# Patient Record
Sex: Male | Born: 1955 | Race: Black or African American | Hispanic: No | Marital: Married | Smoking: Never smoker
Health system: Southern US, Community
[De-identification: ages and names within clinical notes are randomized; demographics above are authoritative.]

## PROBLEM LIST (undated history)

## (undated) DIAGNOSIS — Z9289 Personal history of other medical treatment: Secondary | ICD-10-CM

## (undated) DIAGNOSIS — I2510 Atherosclerotic heart disease of native coronary artery without angina pectoris: Secondary | ICD-10-CM

## (undated) DIAGNOSIS — E119 Type 2 diabetes mellitus without complications: Secondary | ICD-10-CM

## (undated) DIAGNOSIS — I10 Essential (primary) hypertension: Secondary | ICD-10-CM

## (undated) DIAGNOSIS — I252 Old myocardial infarction: Secondary | ICD-10-CM

## (undated) HISTORY — DX: Old myocardial infarction: I25.2

## (undated) HISTORY — DX: Personal history of other medical treatment: Z92.89

## (undated) HISTORY — DX: Atherosclerotic heart disease of native coronary artery without angina pectoris: I25.10

---

## 1999-02-05 ENCOUNTER — Ambulatory Visit (HOSPITAL_COMMUNITY): Admission: RE | Admit: 1999-02-05 | Discharge: 1999-02-05 | Payer: Self-pay | Admitting: Internal Medicine

## 1999-02-05 ENCOUNTER — Encounter: Payer: Self-pay | Admitting: Internal Medicine

## 1999-02-20 ENCOUNTER — Ambulatory Visit (HOSPITAL_COMMUNITY): Admission: RE | Admit: 1999-02-20 | Discharge: 1999-02-20 | Payer: Self-pay | Admitting: Internal Medicine

## 1999-02-20 ENCOUNTER — Encounter: Payer: Self-pay | Admitting: Internal Medicine

## 2000-09-01 ENCOUNTER — Other Ambulatory Visit: Admission: RE | Admit: 2000-09-01 | Discharge: 2000-09-01 | Payer: Self-pay | Admitting: Obstetrics and Gynecology

## 2010-12-27 ENCOUNTER — Emergency Department (HOSPITAL_COMMUNITY)
Admission: EM | Admit: 2010-12-27 | Discharge: 2010-12-28 | Disposition: A | Discharge status: Home / Self Care | Payer: BC Managed Care – PPO | Attending: Emergency Medicine | Admitting: Emergency Medicine

## 2010-12-27 DIAGNOSIS — I10 Essential (primary) hypertension: Secondary | ICD-10-CM | POA: Insufficient documentation

## 2010-12-27 DIAGNOSIS — Z86718 Personal history of other venous thrombosis and embolism: Secondary | ICD-10-CM | POA: Insufficient documentation

## 2010-12-28 LAB — DIFFERENTIAL
Basophils Absolute: 0 10*3/uL (ref 0.0–0.1)
Basophils Relative: 1 % (ref 0–1)
Eosinophils Absolute: 0.2 10*3/uL (ref 0.0–0.7)
Eosinophils Relative: 3 % (ref 0–5)
Lymphocytes Relative: 35 % (ref 12–46)
Lymphs Abs: 2.2 10*3/uL (ref 0.7–4.0)
Monocytes Absolute: 0.6 10*3/uL (ref 0.1–1.0)
Monocytes Relative: 9 % (ref 3–12)
Neutro Abs: 3.3 10*3/uL (ref 1.7–7.7)
Neutrophils Relative %: 52 % (ref 43–77)

## 2010-12-28 LAB — CBC
HCT: 39 % (ref 39.0–52.0)
Hemoglobin: 13.5 g/dL (ref 13.0–17.0)
MCH: 26 pg (ref 26.0–34.0)
MCHC: 34.6 g/dL (ref 30.0–36.0)
MCV: 75.1 fL — ABNORMAL LOW (ref 78.0–100.0)
Platelets: 245 10*3/uL (ref 150–400)
RBC: 5.19 MIL/uL (ref 4.22–5.81)
RDW: 13.9 % (ref 11.5–15.5)
WBC: 6.2 10*3/uL (ref 4.0–10.5)

## 2010-12-28 LAB — BASIC METABOLIC PANEL
BUN: 10 mg/dL (ref 6–23)
CO2: 27 mEq/L (ref 19–32)
Calcium: 9.1 mg/dL (ref 8.4–10.5)
Chloride: 107 mEq/L (ref 96–112)
Creatinine, Ser: 0.84 mg/dL (ref 0.4–1.5)
GFR calc Af Amer: >60 mL/min (ref >60)
GFR calc non Af Amer: >60 mL/min (ref >60)
Glucose, Bld: 211 mg/dL — ABNORMAL HIGH (ref 70–99)
Potassium: 4.1 mEq/L (ref 3.5–5.1)
Sodium: 140 mEq/L (ref 135–145)

## 2013-11-17 DIAGNOSIS — E119 Type 2 diabetes mellitus without complications: Secondary | ICD-10-CM

## 2013-11-17 HISTORY — DX: Type 2 diabetes mellitus without complications: E11.9

## 2013-12-18 DIAGNOSIS — I252 Old myocardial infarction: Secondary | ICD-10-CM

## 2013-12-18 HISTORY — DX: Old myocardial infarction: I25.2

## 2013-12-18 HISTORY — PX: CORONARY ANGIOPLASTY WITH STENT PLACEMENT: SHX49

## 2014-01-07 ENCOUNTER — Encounter (HOSPITAL_COMMUNITY): Payer: Self-pay | Admitting: Emergency Medicine

## 2014-01-07 ENCOUNTER — Inpatient Hospital Stay (HOSPITAL_COMMUNITY)
Admission: EM | Admit: 2014-01-07 | Discharge: 2014-01-10 | DRG: 247 | Disposition: A | Discharge status: Home / Self Care | Payer: BC Managed Care – PPO | Attending: Internal Medicine | Admitting: Internal Medicine

## 2014-01-07 ENCOUNTER — Emergency Department (HOSPITAL_COMMUNITY): Payer: BC Managed Care – PPO

## 2014-01-07 DIAGNOSIS — I214 Non-ST elevation (NSTEMI) myocardial infarction: Principal | ICD-10-CM

## 2014-01-07 DIAGNOSIS — I2129 ST elevation (STEMI) myocardial infarction involving other sites: Secondary | ICD-10-CM

## 2014-01-07 DIAGNOSIS — Z87891 Personal history of nicotine dependence: Secondary | ICD-10-CM

## 2014-01-07 DIAGNOSIS — E119 Type 2 diabetes mellitus without complications: Secondary | ICD-10-CM

## 2014-01-07 DIAGNOSIS — Z79899 Other long term (current) drug therapy: Secondary | ICD-10-CM

## 2014-01-07 DIAGNOSIS — I10 Essential (primary) hypertension: Secondary | ICD-10-CM

## 2014-01-07 HISTORY — DX: Essential (primary) hypertension: I10

## 2014-01-07 HISTORY — DX: Type 2 diabetes mellitus without complications: E11.9

## 2014-01-07 LAB — COMPREHENSIVE METABOLIC PANEL
ALBUMIN: 3.3 g/dL — AB (ref 3.5–5.2)
ALT: 24 U/L (ref 0–53)
AST: 43 U/L — ABNORMAL HIGH (ref 0–37)
Alkaline Phosphatase: 48 U/L (ref 39–117)
BUN: 18 mg/dL (ref 6–23)
CALCIUM: 9 mg/dL (ref 8.4–10.5)
CO2: 26 mEq/L (ref 19–32)
CREATININE: 0.93 mg/dL (ref 0.50–1.35)
Chloride: 96 mEq/L (ref 96–112)
GFR calc Af Amer: >90 mL/min (ref >90)
GFR calc non Af Amer: >90 mL/min (ref >90)
Glucose, Bld: 254 mg/dL — ABNORMAL HIGH (ref 70–99)
Potassium: 4.1 mEq/L (ref 3.7–5.3)
Sodium: 136 mEq/L — ABNORMAL LOW (ref 137–147)
Total Bilirubin: 0.6 mg/dL (ref 0.3–1.2)
Total Protein: 7.8 g/dL (ref 6.0–8.3)

## 2014-01-07 LAB — CBC
HEMATOCRIT: 40.7 % (ref 39.0–52.0)
Hemoglobin: 14.1 g/dL (ref 13.0–17.0)
MCH: 26.5 pg (ref 26.0–34.0)
MCHC: 34.6 g/dL (ref 30.0–36.0)
MCV: 76.5 fL — AB (ref 78.0–100.0)
Platelets: 246 10*3/uL (ref 150–400)
RBC: 5.32 MIL/uL (ref 4.22–5.81)
RDW: 13.5 % (ref 11.5–15.5)
WBC: 9 10*3/uL (ref 4.0–10.5)

## 2014-01-07 LAB — GLUCOSE, CAPILLARY
GLUCOSE-CAPILLARY: 173 mg/dL — AB (ref 70–99)
GLUCOSE-CAPILLARY: 149 mg/dL — AB (ref 70–99)
Glucose-Capillary: 212 mg/dL — ABNORMAL HIGH (ref 70–99)
Glucose-Capillary: 226 mg/dL — ABNORMAL HIGH (ref 70–99)

## 2014-01-07 LAB — TROPONIN I
TROPONIN I: 5.93 ng/mL — AB (ref <0.30)
TROPONIN I: 7.27 ng/mL — AB (ref <0.30)
Troponin I: 7.19 ng/mL (ref <0.30)
Troponin I: 5.05 ng/mL (ref <0.30)

## 2014-01-07 LAB — HEPARIN LEVEL (UNFRACTIONATED)
Heparin Unfractionated: 0.12 IU/mL — ABNORMAL LOW (ref 0.30–0.70)
Heparin Unfractionated: 0.15 IU/mL — ABNORMAL LOW (ref 0.30–0.70)

## 2014-01-07 LAB — PROTIME-INR
INR: 1.07 (ref 0.00–1.49)
PROTHROMBIN TIME: 13.7 s (ref 11.6–15.2)

## 2014-01-07 LAB — D-DIMER, QUANTITATIVE (NOT AT ARMC): D DIMER QUANT: 0.85 ug{FEU}/mL — AB (ref 0.00–0.48)

## 2014-01-07 LAB — APTT: aPTT: 25 seconds (ref 24–37)

## 2014-01-07 MED ORDER — HEPARIN BOLUS VIA INFUSION
2500.0000 [IU] | Freq: Once | INTRAVENOUS | Status: AC
  Administered 2014-01-07: 2500 [IU] via INTRAVENOUS
  Filled 2014-01-07: qty 2500

## 2014-01-07 MED ORDER — INSULIN ASPART 100 UNIT/ML ~~LOC~~ SOLN: 0.0000 [IU] | Freq: Every day | SUBCUTANEOUS | Status: DC

## 2014-01-07 MED ORDER — INSULIN ASPART 100 UNIT/ML ~~LOC~~ SOLN
0.0000 [IU] | Freq: Three times a day (TID) | SUBCUTANEOUS | Status: DC
  Administered 2014-01-08 (×3): 3 [IU] via SUBCUTANEOUS
  Administered 2014-01-09: 2 [IU] via SUBCUTANEOUS
  Administered 2014-01-09 (×2): 3 [IU] via SUBCUTANEOUS
  Administered 2014-01-10: 09:00:00 5 [IU] via SUBCUTANEOUS

## 2014-01-07 MED ORDER — METOPROLOL TARTRATE 25 MG PO TABS
25.0000 mg | ORAL_TABLET | Freq: Two times a day (BID) | ORAL | Status: DC
  Administered 2014-01-07 – 2014-01-10 (×6): 25 mg via ORAL
  Filled 2014-01-07 (×8): qty 1

## 2014-01-07 MED ORDER — HEPARIN BOLUS VIA INFUSION
4000.0000 [IU] | Freq: Once | INTRAVENOUS | Status: AC
  Administered 2014-01-07: 4000 [IU] via INTRAVENOUS
  Filled 2014-01-07: qty 4000

## 2014-01-07 MED ORDER — HEPARIN (PORCINE) IN NACL 100-0.45 UNIT/ML-% IJ SOLN
1200.0000 [IU]/h | INTRAMUSCULAR | Status: DC
  Administered 2014-01-07: 1200 [IU]/h via INTRAVENOUS
  Filled 2014-01-07: qty 250

## 2014-01-07 MED ORDER — ASPIRIN EC 81 MG PO TBEC
81.0000 mg | DELAYED_RELEASE_TABLET | Freq: Every day | ORAL | Status: DC
  Administered 2014-01-08: 81 mg via ORAL
  Filled 2014-01-07 (×2): qty 1

## 2014-01-07 MED ORDER — NITROGLYCERIN 0.4 MG SL SUBL: 0.4000 mg | SUBLINGUAL_TABLET | SUBLINGUAL | Status: DC | PRN

## 2014-01-07 MED ORDER — INSULIN ASPART 100 UNIT/ML ~~LOC~~ SOLN
0.0000 [IU] | Freq: Three times a day (TID) | SUBCUTANEOUS | Status: DC
  Administered 2014-01-07: 3 [IU] via SUBCUTANEOUS
  Administered 2014-01-07: 2 [IU] via SUBCUTANEOUS
  Administered 2014-01-07: 3 [IU] via SUBCUTANEOUS

## 2014-01-07 MED ORDER — ASPIRIN 81 MG PO CHEW
324.0000 mg | CHEWABLE_TABLET | Freq: Once | ORAL | Status: AC
  Administered 2014-01-07: 324 mg via ORAL
  Filled 2014-01-07: qty 4

## 2014-01-07 MED ORDER — HYDROCHLOROTHIAZIDE 12.5 MG PO CAPS
12.5000 mg | ORAL_CAPSULE | Freq: Every day | ORAL | Status: DC
Start: 2014-01-07 — End: 2014-01-09
  Administered 2014-01-07 – 2014-01-08 (×2): 12.5 mg via ORAL
  Filled 2014-01-07 (×3): qty 1

## 2014-01-07 MED ORDER — HEPARIN (PORCINE) IN NACL 100-0.45 UNIT/ML-% IJ SOLN
1750.0000 [IU]/h | INTRAMUSCULAR | Status: DC
  Administered 2014-01-08 – 2014-01-09 (×2): 1750 [IU]/h via INTRAVENOUS
  Filled 2014-01-07 (×4): qty 250

## 2014-01-07 MED ORDER — HEPARIN (PORCINE) IN NACL 100-0.45 UNIT/ML-% IJ SOLN
1500.0000 [IU]/h | INTRAMUSCULAR | Status: DC
  Administered 2014-01-07: 1500 [IU]/h via INTRAVENOUS
  Filled 2014-01-07 (×2): qty 250

## 2014-01-07 MED ORDER — ATORVASTATIN CALCIUM 40 MG PO TABS
40.0000 mg | ORAL_TABLET | Freq: Every day | ORAL | Status: DC
  Administered 2014-01-07 – 2014-01-09 (×3): 40 mg via ORAL
  Filled 2014-01-07 (×4): qty 1

## 2014-01-07 MED ORDER — NITROGLYCERIN 2 % TD OINT
1.0000 [in_us] | TOPICAL_OINTMENT | Freq: Three times a day (TID) | TRANSDERMAL | Status: DC
  Administered 2014-01-07 – 2014-01-09 (×7): 1 [in_us] via TOPICAL
  Filled 2014-01-07: qty 30

## 2014-01-07 MED ORDER — NITROGLYCERIN 2 % TD OINT
0.5000 [in_us] | TOPICAL_OINTMENT | Freq: Once | TRANSDERMAL | Status: AC
  Administered 2014-01-07: 0.5 [in_us] via TOPICAL
  Filled 2014-01-07: qty 1

## 2014-01-07 NOTE — Progress Notes (Signed)
Patient Name: William [name] Date of Encounter: 01/07/2014  Active Problems:   NSTEMI (non-ST elevated myocardial infarction)   HTN (hypertension)    SUBJECTIVE: No chest pain, no SOB   OBJECTIVE Filed Vitals:   01/07/14 0447 01/07/14 0530 01/07/14 0632 01/07/14 0722  BP: 151/70 153/74 158/74 153/78  Pulse: 79 82 77 78  Temp:  98.4 F (36.9 C)  98.3 F (36.8 C)  TempSrc:  Oral  Oral  Resp: 14 23 14    Height:    5\' 10"  (1.778 m)  Weight:    214 lb 1.6 oz (97.115 kg)  SpO2: 99% 98% 100% 98%    Intake/Output Summary (Last 24 hours) at 01/07/14 0855 Last data filed at 01/07/14 0646  Gross per 24 hour  Intake      0 ml  Output    650 ml  Net   -650 ml   Filed Weights   01/07/14 0318 01/07/14 0722  Weight: 222 lb (100.699 kg) 214 lb 1.6 oz (97.115 kg)   LABS: CBC: Recent Labs  01/07/14 0342  WBC 9.0  HGB 14.1  HCT 40.7  MCV 76.5*  PLT 246   INR: Recent Labs  01/07/14 0342  INR 7.35   Basic Metabolic Panel: Recent Labs  01/07/14 0342  NA 136*  K 4.1  CL 96  CO2 26  GLUCOSE 254*  BUN 18  CREATININE 0.93  CALCIUM 9.0   Liver Function Tests: Recent Labs  01/07/14 0342  AST 43*  ALT 24  ALKPHOS 48  BILITOT 0.6  PROT 7.8  ALBUMIN 3.3*   Cardiac Enzymes: Recent Labs  01/07/14 0413  TROPONINI 5.93*   D-dimer: Recent Labs  01/07/14 0413  DDIMER 0.85*   TELE:   SR  Radiology/Studies: Dg Chest Portable 1 View  01/07/2014   CLINICAL DATA:  Chest discomfort and shortness of breath.  EXAM: PORTABLE CHEST - 1 VIEW  COMPARISON:  None.  FINDINGS: The lungs are well-aerated and clear. There is no evidence of focal opacification, pleural effusion or pneumothorax.  The cardiomediastinal silhouette is within normal limits. No acute osseous abnormalities are seen.  IMPRESSION: No acute cardiopulmonary process seen.   Electronically Signed   By: Garald Balding M.D.   On: 01/07/2014 04:26     Current Medications:  . [START ON 01/08/2014]  aspirin EC  81 mg Oral Daily  . atorvastatin  40 mg Oral q1800  . hydrochlorothiazide  12.5 mg Oral Daily  . insulin aspart  0-5 Units Subcutaneous QHS  . insulin aspart  0-9 Units Subcutaneous TID WC  . metoprolol tartrate  25 mg Oral BID   . heparin 1,200 Units/hr (01/07/14 0521)   Physical exam: General: Alert and oriented x3 in no acute distress, pleasant sitting in chair Cardiovascular: Regular rate and rhythm, 2/6 systolic murmur left lower sternal border, no JVD Lungs: Clear to auscultation bilaterally no wheezes, normal respiratory effort Abdomen: Soft, nontender, normal bowel sounds Skin: Warm and dry Pulses: Excellent. Excellent radial pulse. Neurologic: Nonfocal.  ASSESSMENT AND PLAN: Active Problems:   NSTEMI (non-ST elevated myocardial infarction) - Continue ASA/statin/BB, will add nitro paste to meds, OK to eat now since pain-free, but will schedule cath Mon. The risks and benefits of a cardiac catheterization including, but not limited to, death, stroke, MI, kidney damage and bleeding were discussed with the patient and his wife who indicate understanding and agree to proceed.     HTN (hypertension) - BB added, follow  Hyperglycemia - A1c ordered, on SSI.  Signed, Rosaria Ferries , PA-C 8:55 AM 01/07/2014  Patient personally seen and examined, agree with above.  58 year old male with no prior cardiovascular history with non-ST elevation myocardial infarction.  1. Non-ST elevation myocardial infarction-troponin is now in the 7 range. He is currently chest pain-free. No shortness of breath. I discussed with him cardiac catheterization. If symptoms worsen over the weekend, we will proceed urgently with heart cath. If not, Monday. Excellent radial pulse. Continue with IV heparin, metoprolol, nitroglycerin patch, aspirin, atorvastatin. I will not administer Plavix in case surgical disease is present. He understands that he is at increased risk over the next 30 days.  2.  Heart murmur-I will check echocardiogram.  3. Abnormal EKG-ST segment depression noted in the inferior/lateral leads. No segment elevation. This is consistent with ischemia. Heart catheterization pending.  4. Hypertension-beta blocker. Low-dose hydrochlorothiazide. Add ACE inhibitor if necessary.  5. Elevated glucose-254 on arrival. Could certainly have diabetes. Checking hemoglobin A1c. May benefit from metformin in the future.   Candee Furbish, MD

## 2014-01-07 NOTE — ED Notes (Signed)
Report given to [facility] unit nurse , transported pt. In stable condition , denies chest pain / respirations unlabored , iv site unremarkable / Heparin drip infusing .

## 2014-01-07 NOTE — Progress Notes (Addendum)
Pts D-dimer 0.85; pt on IV heparin, PA notified; pt without any complaints, was told would reassess in AM

## 2014-01-07 NOTE — H&P (Signed)
History and Physical  Patient ID: William [name] MRN: [id number], DOB: 02-17-1956 Date of Encounter: 01/07/2014, 6:17 AM Primary Physician: Elyn Peers, MD Primary Cardiologist: nill  Chief Complaint: chest pain  Reason for Admission: NSTE- ACS   HPI: 58 yr old male with hx of HTN , prior smoker presents with chest pain   Pt states that for the past 2-3 days he has been experiencing substernal dull discomfort which is non radiating and somewhat with SL ntg given to him in the ER. This is the first time he has experienced with discomfort . Pt denies any SOB , orthopnea, PND , LE edema , Syncope ,claudcation , focal weakness, or bleeding diathesis .    Past Medical History  Diagnosis Date  . Hypertension      Most Recent Cardiac Studies: nill    Surgical History: History reviewed. No pertinent past surgical history.   Home Meds: Prior to Admission medications   Medication Sig Start Date End Date Taking? Authorizing Provider  ibuprofen (ADVIL,MOTRIN) 200 MG tablet Take 400-600 mg by mouth every 8 (eight) hours as needed for mild pain.   Yes Historical Provider, MD  PRESCRIPTION MEDICATION Take 1 tablet by mouth daily. Amlodipine/valsartan/hctz-unknown strength   Yes Historical Provider, MD    Allergies:  Allergies  Allergen Reactions  . Shellfish Allergy Hives    History   Social History  . Marital Status: Married    Spouse Name: N/A    Number of Children: N/A  . Years of Education: N/A   Occupational History  . Not on file.   Social History Main Topics  . Smoking status: Never Smoker   . Smokeless tobacco: Not on file  . Alcohol Use: Yes  . Drug Use: Not on file  . Sexual Activity: Not on file   Other Topics Concern  . Not on file   Social History Narrative  . No narrative on file     No family history on file.  Review of Systems: General: negative for chills, fever, night sweats or weight changes.  Cardiovascular: per HPI  Dermatological: negative  for rash Respiratory: negative for cough or wheezing Urologic: negative for hematuria Abdominal: negative for nausea, vomiting, diarrhea, bright red blood per rectum, melena, or hematemesis Neurologic: negative for visual changes, syncope, or dizziness All other systems reviewed and are otherwise negative except as noted above.  Labs:   Lab Results  Component Value Date   WBC 9.0 01/07/2014   HGB 14.1 01/07/2014   HCT 40.7 01/07/2014   MCV 76.5* 01/07/2014   PLT 246 01/07/2014    Recent Labs Lab 01/07/14 0342  NA 136*  K 4.1  CL 96  CO2 26  BUN 18  CREATININE 0.93  CALCIUM 9.0  PROT 7.8  BILITOT 0.6  ALKPHOS 48  ALT 24  AST 43*  GLUCOSE 254*    Recent Labs  01/07/14 0413  TROPONINI 5.93*   No results found for this basename: CHOL, HDL, LDLCALC, TRIG   Lab Results  Component Value Date   DDIMER 0.85* 01/07/2014    Radiology/Studies:  Dg Chest Portable 1 View  01/07/2014   CLINICAL DATA:  Chest discomfort and shortness of breath.  EXAM: PORTABLE CHEST - 1 VIEW  COMPARISON:  None.  FINDINGS: The lungs are well-aerated and clear. There is no evidence of focal opacification, pleural effusion or pneumothorax.  The cardiomediastinal silhouette is within normal limits. No acute osseous abnormalities are seen.  IMPRESSION: No acute cardiopulmonary process seen.  Electronically Signed   By: Garald Balding M.D.   On: 01/07/2014 04:26     EKG: NSR with inferolateral ST depression with TWI   Physical Exam: Blood pressure 153/74, pulse 82, temperature 98.4 F (36.9 C), temperature source Oral, resp. rate 23, height 5\' 8"  (1.727 m), weight 100.699 kg (222 lb), SpO2 98.00%. General: Well developed, well nourished, in no acute distress. Head: Normocephalic, atraumatic, sclera non-icteric, no xanthomas, nares are without discharge.  Neck: Negative for carotid bruits. JVD not elevated. Lungs: Clear bilaterally to auscultation without wheezes, rales, or rhonchi. Breathing is  unlabored. Heart: RRR with S1 S2. S9/6 systolic murmur  Abdomen: Soft, non-tender, non-distended with normoactive bowel sounds. No hepatomegaly. No rebound/guarding. No obvious abdominal masses. Msk:  Strength and tone appear normal for age. Extremities: No clubbing or cyanosis. No edema.  Distal pedal pulses are 2+ and equal bilaterally. Neuro: Alert and oriented X 3. No focal deficit. No facial asymmetry. Moves all extremities spontaneously. Psych:  Responds to questions appropriately with a normal affect.    ASSESSMENT AND PLAN:  NSTE- ACS  HTN uncontrolled  Hyperglycemia   Plan  Start heparin , aspirin , statin and b-blocker  Cont HCTZ  Start SSI ACHS , check Hg A1c , lipid profile  NPO for possible LHC in am   Signed, Lavaeh Bau, A PA-C 01/07/2014, 6:17 AM

## 2014-01-07 NOTE — ED Notes (Signed)
The pt is c/o mid-chest pain for 3-4 days.  No previous history.  No sob  Nausea ot dizziness.  Better with stretching

## 2014-01-07 NOTE — ED Provider Notes (Signed)
CSN: [id number]     Arrival date & time 01/07/14  0310 History   First MD Initiated Contact with Patient 01/07/14 [phone]     Chief Complaint  Patient presents with  . Chest Pain     (Consider location/radiation/quality/duration/timing/severity/associated sxs/prior Treatment) HPI Comments: 58 year old male, history of hypertension who presents with a complaint of left-sided chest.  He states that he has never had any problems with his heart that he knows of, he has never had any exertional pain or shortness of breath but over the last 3-4 days he has had a vague discomfort in the left side of his chest which is intermittent, does not appear to be worse with deep breathing position or exertion. He is only having very very mild symptoms at this time, he does not call it pain at this time. He has had no swelling in his legs, no recent travel trauma or immobilization and he does not smoke cigarettes. He does recall a distant history of a pulmonary embolism when he was much younger, he does not take anticoagulation anymore. He did have some aspirin over the last couple of days and was given aspirin on arrival. He denies fevers chills nausea vomiting or coughing or shortness of breath.  Patient is a 58 y.o. male presenting with chest pain. The history is provided by the patient and the spouse.  Chest Pain   Past Medical History  Diagnosis Date  . Hypertension    History reviewed. No pertinent past surgical history. No family history on file. History  Substance Use Topics  . Smoking status: Never Smoker   . Smokeless tobacco: Not on file  . Alcohol Use: Yes    Review of Systems  Cardiovascular: Positive for chest pain.  All other systems reviewed and are negative.      Allergies  Shellfish allergy  Home Medications   Current Outpatient Rx  Name  Route  Sig  Dispense  Refill  . ibuprofen (ADVIL,MOTRIN) 200 MG tablet   Oral   Take 400-600 mg by mouth every 8 (eight) hours as needed  for mild pain.         Marland Kitchen PRESCRIPTION MEDICATION   Oral   Take 1 tablet by mouth daily. Amlodipine/valsartan/hctz-unknown strength          BP 151/70  Pulse 79  Temp(Src) 98.8 F (37.1 C)  Resp 14  Ht 5\' 8"  (1.727 m)  Wt 222 lb (100.699 kg)  BMI 33.76 kg/m2  SpO2 99% Physical Exam  Nursing note and vitals reviewed. Constitutional: He appears well-developed and well-nourished. No distress.  HENT:  Head: Normocephalic and atraumatic.  Mouth/Throat: Oropharynx is clear and moist. No oropharyngeal exudate.  Eyes: Conjunctivae and EOM are normal. Pupils are equal, round, and reactive to light. Right eye exhibits no discharge. Left eye exhibits no discharge. No scleral icterus.  Neck: Normal range of motion. Neck supple. No JVD present. No thyromegaly present.  Cardiovascular: Normal rate, regular rhythm, normal heart sounds and intact distal pulses.  Exam reveals no gallop and no friction rub.   No murmur heard. Pulmonary/Chest: Effort normal and breath sounds normal. No respiratory distress. He has no wheezes. He has no rales.  Abdominal: Soft. Bowel sounds are normal. He exhibits no distension and no mass. There is no tenderness.  Musculoskeletal: Normal range of motion. He exhibits no edema and no tenderness.  Lymphadenopathy:    He has no cervical adenopathy.  Neurological: He is alert. Coordination normal.  Skin: Skin is warm  and dry. No rash noted. No erythema.  Psychiatric: He has a normal mood and affect. His behavior is normal.    ED Course  Procedures (including critical care time) Labs Review Labs Reviewed  CBC - Abnormal; Notable for the following:    MCV 76.5 (*)    All other components within normal limits  COMPREHENSIVE METABOLIC PANEL - Abnormal; Notable for the following:    Sodium 136 (*)    Glucose, Bld 254 (*)    Albumin 3.3 (*)    AST 43 (*)    All other components within normal limits  D-DIMER, QUANTITATIVE - Abnormal; Notable for the following:     D-Dimer, Quant 0.85 (*)    All other components within normal limits  TROPONIN I - Abnormal; Notable for the following:    Troponin I 5.93 (*)    All other components within normal limits  APTT  PROTIME-INR   Imaging Review Dg Chest Portable 1 View  01/07/2014   CLINICAL DATA:  Chest discomfort and shortness of breath.  EXAM: PORTABLE CHEST - 1 VIEW  COMPARISON:  None.  FINDINGS: The lungs are well-aerated and clear. There is no evidence of focal opacification, pleural effusion or pneumothorax.  The cardiomediastinal silhouette is within normal limits. No acute osseous abnormalities are seen.  IMPRESSION: No acute cardiopulmonary process seen.   Electronically Signed   By: Garald Balding M.D.   On: 01/07/2014 04:26    EKG Interpretation    Date/Time:  Saturday January 07 2014 03:17:15 EST Ventricular Rate:  87 PR Interval:  190 QRS Duration: 96 QT Interval:  356 QTC Calculation: 428 R Axis:   50 Text Interpretation:  Normal sinus rhythm ST \\T \ T wave abnormality, consider lateral ischemia Abnormal ECG Since last tracing T wave inversion now found Confirmed by Deshaun Weisinger  MD, Marthe Dant ([id number]) on 01/07/2014 4:26:08 AM            MDM   Final diagnoses:  NSTEMI (non-ST elevated myocardial infarction)    The patient's exam is benign, his EKG does show abnormal findings in the lateral leads with T wave inversions that could be consistent with ischemia. His pain is intermittent and fluctuating, he will also need evaluation for a pulmonary embolism as he does have a history of this and is otherwise fairly low risk for heart disease.  The patient's troponin is elevated, close to 6, renal function is preserved, hyperglycemic at 250 with otherwise normal electrolytes. Chest x-ray shows no acute findings.  I am concerned with the patient's new T-wave inversions on EKG and elevated troponin number cardiology has been paged, heparin started  CRITICAL CARE Performed by: Johnna Acosta Total  critical care time: 35 Critical care time was exclusive of separately billable procedures and treating other patients. Critical care was necessary to treat or prevent imminent or life-threatening deterioration. Critical care was time spent personally by me on the following activities: development of treatment plan with patient and/or surrogate as well as nursing, discussions with consultants, evaluation of patient's response to treatment, examination of patient, obtaining history from patient or surrogate, ordering and performing treatments and interventions, ordering and review of laboratory studies, ordering and review of radiographic studies, pulse oximetry and re-evaluation of patient's condition.   Johnna Acosta, MD 01/07/14 [phone]

## 2014-01-07 NOTE — Progress Notes (Signed)
ANTICOAGULATION CONSULT NOTE - Initial Consult  Pharmacy Consult for Heparin  Indication: chest pain/ACS  Allergies  Allergen Reactions  . Shellfish Allergy Hives    Patient Measurements: Height: 5\' 10"  (177.8 cm) Weight: 215 lb 11.2 oz (97.841 kg) IBW/kg (Calculated) : 73 Heparin Dosing Weight: ~90 kg  Vital Signs: Temp: 98.2 F (36.8 C) (02/21 2043) Temp src: Oral (02/21 2043) BP: 142/72 mmHg (02/21 2043) Pulse Rate: 67 (02/21 2043)  Labs:  Recent Labs  01/07/14 0342  01/07/14 0855 01/07/14 1257 01/07/14 1951 01/07/14 2117  HGB 14.1  --   --   --   --   --   HCT 40.7  --   --   --   --   --   PLT 246  --   --   --   --   --   APTT 25  --   --   --   --   --   LABPROT 13.7  --   --   --   --   --   INR 1.07  --   --   --   --   --   HEPARINUNFRC  --   --   --  0.12*  --  0.15*  CREATININE 0.93  --   --   --   --   --   TROPONINI  --   < > 7.27* 7.19* 5.05*  --   < > = values in this interval not displayed.  Estimated Creatinine Clearance: 104 ml/min (by C-G formula based on Cr of 0.93).   Assessment: 59 y/o M with PMH of HTN presents with CP. Heparin level is still subtherapeutic. Will try to give a bolus and increase the rate again.  Goal of Therapy:  Heparin level 0.3-0.7 units/ml Monitor platelets by anticoagulation protocol: Yes   Plan:   Heparin bolus 2500 units x1 Increase drip to 1750 units/hr F/u heparin drip in AM

## 2014-01-07 NOTE — Progress Notes (Signed)
ANTICOAGULATION CONSULT NOTE - Initial Consult  Pharmacy Consult for Heparin  Indication: chest pain/ACS  Allergies  Allergen Reactions  . Shellfish Allergy Hives    Patient Measurements: Height: 5\' 8"  (172.7 cm) Weight: 222 lb (100.699 kg) IBW/kg (Calculated) : 68.4 Heparin Dosing Weight: ~90 kg  Vital Signs: Temp: 98.4 F (36.9 C) (02/21 0530) Temp src: Oral (02/21 0530) BP: 153/74 mmHg (02/21 0530) Pulse Rate: 82 (02/21 0530)  Labs:  Recent Labs  01/07/14 0342 01/07/14 0413  HGB 14.1  --   HCT 40.7  --   PLT 246  --   APTT 25  --   LABPROT 13.7  --   INR 1.07  --   CREATININE 0.93  --   TROPONINI  --  5.93*    Estimated Creatinine Clearance: 102 ml/min (by C-G formula based on Cr of 0.93).   Assessment: 58 y/o M with PMH of HTN presents with CP, started on heparin by EDP, to be continued by cardiology. Labs as above.   Goal of Therapy:  Heparin level 0.3-0.7 units/ml Monitor platelets by anticoagulation protocol: Yes   Plan:  -Continue heparin 1200 units/hr (already got 4000 unit BOLUS) -1200 HL -Daily CBC/HL -F/U cardiology plans  Narda Bonds 01/07/2014,6:27 AM

## 2014-01-07 NOTE — Progress Notes (Signed)
Pharmacy Note-Anticoagulation  Pharmacy Consult :  58 y.o. male is currently on Heparin infusion for NSTEMI.   Latest Labs : Hematology :  Recent Labs  01/07/14 0342 01/07/14 1257  HGB 14.1  --   HCT 40.7  --   PLT 246  --   APTT 25  --   LABPROT 13.7  --   INR 1.07  --   HEPARINUNFRC  --  0.12*  CREATININE 0.93  --     Lab Results  Component Value Date   INR 1.07 01/07/2014        HEPARINUNFRC 0.12* 01/07/2014        HGB 14.1 01/07/2014   HGB 13.5 12/28/2010    Current Infusion[s]:  . heparin 1,200 Units/hr (01/07/14 0521)    Assessment :  Heparin level is Sub-therapeutic, 0.12.  Heparin infusing at 1200 units/hr..  No bleeding complications observed.  Goal :  Heparin goal is Heparin level 0.3-0.7 units/ml.  Plan :   Increase Heparin infusion to 1500 units/hr   Next Heparin level will be due in 6 hours, 2100 pm.   Daily heparin level and CBC.  Monitor for bleeding complications.  Jarvis Sawa, Craig Guess, Pharm.D. 01/07/2014  2:28 PM

## 2014-01-08 DIAGNOSIS — I517 Cardiomegaly: Secondary | ICD-10-CM

## 2014-01-08 DIAGNOSIS — I10 Essential (primary) hypertension: Secondary | ICD-10-CM

## 2014-01-08 LAB — CBC
HEMATOCRIT: 38.5 % — AB (ref 39.0–52.0)
HEMOGLOBIN: 13.3 g/dL (ref 13.0–17.0)
MCH: 26.3 pg (ref 26.0–34.0)
MCHC: 34.5 g/dL (ref 30.0–36.0)
MCV: 76.1 fL — ABNORMAL LOW (ref 78.0–100.0)
Platelets: 222 10*3/uL (ref 150–400)
RBC: 5.06 MIL/uL (ref 4.22–5.81)
RDW: 13.3 % (ref 11.5–15.5)
WBC: 6.9 10*3/uL (ref 4.0–10.5)

## 2014-01-08 LAB — BASIC METABOLIC PANEL
BUN: 17 mg/dL (ref 6–23)
CHLORIDE: 102 meq/L (ref 96–112)
CO2: 25 mEq/L (ref 19–32)
Calcium: 8.6 mg/dL (ref 8.4–10.5)
Creatinine, Ser: 0.82 mg/dL (ref 0.50–1.35)
GFR calc Af Amer: >90 mL/min (ref >90)
Glucose, Bld: 186 mg/dL — ABNORMAL HIGH (ref 70–99)
Potassium: 4 mEq/L (ref 3.7–5.3)
Sodium: 137 mEq/L (ref 137–147)

## 2014-01-08 LAB — LIPID PANEL
CHOLESTEROL: 105 mg/dL (ref 0–200)
HDL: 41 mg/dL (ref >39)
LDL Cholesterol: 49 mg/dL (ref 0–99)
Total CHOL/HDL Ratio: 2.6 RATIO
Triglycerides: 73 mg/dL (ref <150)
VLDL: 15 mg/dL (ref 0–40)

## 2014-01-08 LAB — HEMOGLOBIN A1C
Hgb A1c MFr Bld: 9 % — ABNORMAL HIGH (ref <5.7)
Mean Plasma Glucose: 212 mg/dL — ABNORMAL HIGH (ref <117)

## 2014-01-08 LAB — PROTIME-INR
INR: 1.04 (ref 0.00–1.49)
Prothrombin Time: 13.4 seconds (ref 11.6–15.2)

## 2014-01-08 LAB — GLUCOSE, CAPILLARY
GLUCOSE-CAPILLARY: 179 mg/dL — AB (ref 70–99)
GLUCOSE-CAPILLARY: 195 mg/dL — AB (ref 70–99)
Glucose-Capillary: 192 mg/dL — ABNORMAL HIGH (ref 70–99)
Glucose-Capillary: 170 mg/dL — ABNORMAL HIGH (ref 70–99)

## 2014-01-08 LAB — HEPARIN LEVEL (UNFRACTIONATED)
HEPARIN UNFRACTIONATED: 0.4 [IU]/mL (ref 0.30–0.70)
Heparin Unfractionated: 0.45 IU/mL (ref 0.30–0.70)

## 2014-01-08 MED ORDER — SODIUM CHLORIDE 0.9 % IJ SOLN: 3.0000 mL | INTRAMUSCULAR | Status: DC | PRN

## 2014-01-08 MED ORDER — SODIUM CHLORIDE 0.9 % IJ SOLN: 3.0000 mL | Freq: Two times a day (BID) | INTRAMUSCULAR | Status: DC

## 2014-01-08 MED ORDER — SODIUM CHLORIDE 0.9 % IV SOLN
1.0000 mL/kg/h | INTRAVENOUS | Status: DC
  Administered 2014-01-09: 1 mL/kg/h via INTRAVENOUS

## 2014-01-08 MED ORDER — ASPIRIN 81 MG PO CHEW
324.0000 mg | CHEWABLE_TABLET | ORAL | Status: AC
  Administered 2014-01-09: 324 mg via ORAL
  Filled 2014-01-08: qty 4

## 2014-01-08 MED ORDER — DIAZEPAM 5 MG PO TABS
5.0000 mg | ORAL_TABLET | ORAL | Status: AC
  Administered 2014-01-09: 5 mg via ORAL
  Filled 2014-01-08: qty 1

## 2014-01-08 MED ORDER — ASPIRIN 81 MG PO CHEW
81.0000 mg | CHEWABLE_TABLET | ORAL | Status: DC
Start: 2014-01-09 — End: 2014-01-08

## 2014-01-08 MED ORDER — DIAZEPAM 5 MG PO TABS: 5.0000 mg | ORAL_TABLET | ORAL | Status: DC

## 2014-01-08 MED ORDER — SODIUM CHLORIDE 0.9 % IV SOLN: 250.0000 mL | INTRAVENOUS | Status: DC | PRN

## 2014-01-08 MED ORDER — SODIUM CHLORIDE 0.9 % IV SOLN: 1.0000 mL/kg/h | INTRAVENOUS | Status: DC

## 2014-01-08 NOTE — Progress Notes (Signed)
Pharmacy Note-Anticoagulation  Pharmacy Consult :  58 y.o. male is currently on Heparin for NSTEMI.  Scheduled Cardiac Cath in AM..   Heparin Dosing Wt :  90 kg Latest Labs : Hematology :  Recent Labs  01/07/14 0342 01/07/14 1257 01/07/14 2117 01/08/14 0607 01/08/14 1250  HGB 14.1  --   --  13.3  --   HCT 40.7  --   --  38.5*  --   PLT 246  --   --  222  --   APTT 25  --   --   --   --   LABPROT 13.7  --   --  13.4  --   INR 1.07  --   --  1.04  --   HEPARINUNFRC  --  0.12* 0.15* 0.40 0.45  CREATININE 0.93  --   --  0.82  --     Lab Results  Component Value Date   INR 1.04 01/08/2014   INR 1.07 01/07/2014        HEPARINUNFRC 0.45 01/08/2014   HEPARINUNFRC 0.40 01/08/2014   HEPARINUNFRC 0.15* 01/07/2014        HGB 13.3 01/08/2014   HGB 14.1 01/07/2014   HGB 13.5 12/28/2010   Current Infusion[s]: Infusions:  . heparin 1,750 Units/hr (01/08/14 1139)    Assessment :  Heparin infusing at 1750 units/hr.  Heparin level is within the therapeutic range, 0.45 units/ml.  No bleeding complications observed.  Goal :  Heparin goal is Heparin level 0.3-0.7 units/ml.  Plan : 1. Heparin will be continued at 1750 units/hr.  2. Daily Heparin level, CBC while on Heparin.  Monitor for bleeding complications.    Leasia Swann, Craig Guess, Pharm.D. 01/08/2014  2:45 PM

## 2014-01-08 NOTE — Progress Notes (Signed)
[facility] for Heparin  Indication: chest pain/ACS  Allergies  Allergen Reactions  . Shellfish Allergy Hives    Patient Measurements: Height: 5\' 10"  (177.8 cm) Weight: 215 lb 11.2 oz (97.841 kg) IBW/kg (Calculated) : 73 Heparin Dosing Weight: ~90 kg  Vital Signs: Temp: 99.2 F (37.3 C) (02/22 0529) Temp src: Oral (02/22 0529) BP: 134/65 mmHg (02/22 0529) Pulse Rate: 65 (02/22 0529)  Labs:  Recent Labs  01/07/14 0342  01/07/14 0855 01/07/14 1257 01/07/14 1951 01/07/14 2117 01/08/14 0607  HGB 14.1  --   --   --   --   --  13.3  HCT 40.7  --   --   --   --   --  38.5*  PLT 246  --   --   --   --   --  222  APTT 25  --   --   --   --   --   --   LABPROT 13.7  --   --   --   --   --  13.4  INR 1.07  --   --   --   --   --  1.04  HEPARINUNFRC  --   --   --  0.12*  --  0.15* 0.40  CREATININE 0.93  --   --   --   --   --   --   TROPONINI  --   < > 7.27* 7.19* 5.05*  --   --   < > = values in this interval not displayed.  Estimated Creatinine Clearance: 104 ml/min (by C-G formula based on Cr of 0.93).   Assessment: 58 y/o M with PMH of HTN on heparin for CP. HL 0.40. Other labs as above.   Goal of Therapy:  Heparin level 0.3-0.7 units/ml Monitor platelets by anticoagulation protocol: Yes   Plan:  -Continue heparin 1750 units/hr  -1200 HL -Daily CBC/HL -F/U cardiology plans  Narda Bonds 01/08/2014,6:54 AM

## 2014-01-08 NOTE — Progress Notes (Signed)
Utilization review completed.  

## 2014-01-08 NOTE — Progress Notes (Signed)
Subjective:  Feels better, no chest pain, no chest tightness, no shortness of breath. Awaiting cardiac catheterization tomorrow.  Objective:  Vital Signs in the last 24 hours: Temp:  [98.2 F (36.8 C)-99.2 F (37.3 C)] 99.2 F (37.3 C) (02/22 0529) Pulse Rate:  [61-78] 61 (02/22 0916) Resp:  [15-20] 18 (02/22 0529) BP: (127-142)/(59-72) 127/63 mmHg (02/22 0916) SpO2:  [98 %-100 %] 98 % (02/22 0529) Weight:  [215 lb 11.2 oz (97.841 kg)] 215 lb 11.2 oz (97.841 kg) (02/21 1900)  Intake/Output from previous day: 02/21 0701 - 02/22 0700 In: 648.5 [P.O.:600; I.V.:48.5] Out: -    Physical Exam: General: Well developed, well nourished, in no acute distress. Head:  Normocephalic and atraumatic. Lungs: Clear to auscultation and percussion. Heart: Normal S1 and S2.  No murmur, rubs or gallops.  Abdomen: soft, non-tender, positive bowel sounds. Extremities: No clubbing or cyanosis. No edema. Neurologic: Alert and oriented x 3.    Lab Results:  Recent Labs  01/07/14 0342 01/08/14 0607  WBC 9.0 6.9  HGB 14.1 13.3  PLT 246 222    Recent Labs  01/07/14 0342 01/08/14 0607  NA 136* 137  K 4.1 4.0  CL 96 102  CO2 26 25  GLUCOSE 254* 186*  BUN 18 17  CREATININE 0.93 0.82    Recent Labs  01/07/14 1257 01/07/14 1951  TROPONINI 7.19* 5.05*   Hepatic Function Panel  Recent Labs  01/07/14 0342  PROT 7.8  ALBUMIN 3.3*  AST 43*  ALT 24  ALKPHOS 48  BILITOT 0.6    Recent Labs  01/08/14 0607  CHOL 105   No results found for this basename: PROTIME,  in the last 72 hours  Imaging: Dg Chest Portable 1 View  01/07/2014   CLINICAL DATA:  Chest discomfort and shortness of breath.  EXAM: PORTABLE CHEST - 1 VIEW  COMPARISON:  None.  FINDINGS: The lungs are well-aerated and clear. There is no evidence of focal opacification, pleural effusion or pneumothorax.  The cardiomediastinal silhouette is within normal limits. No acute osseous abnormalities are seen.   IMPRESSION: No acute cardiopulmonary process seen.   Electronically Signed   By: Garald Balding M.D.   On: 01/07/2014 04:26   Personally viewed.   Telemetry: No adverse arrhythmias Personally viewed.   EKG:  ST segment depression noted  Cardiac Studies:  Echo pending  Assessment/Plan:  Active Problems:   NSTEMI (non-ST elevated myocardial infarction)   HTN (hypertension)  58 year old male with no prior cardiovascular history with non-ST elevation myocardial infarction.   1. Non-ST elevation myocardial infarction-troponin is now trending down from 7-5. He is currently chest pain-free. No shortness of breath. I discussed with him cardiac catheterization. If symptoms worsen we will proceed urgently with heart cath. If not, Monday. Excellent radial pulse. Continue with IV heparin, metoprolol, nitroglycerin patch, aspirin, atorvastatin. I will not administer Plavix in case surgical disease is present. He understands that he is at increased risk over the next 30 days. I will write precath orders.  Discussed at length risks and benefits of heart catheterization including stroke, heart attack, death, renal impairment, bleeding with he and his family. 4 children are present. Wife is present. She did ask several questions. She asked about the importance of dental health and heart disease.  2. Heart murmur-I will check echocardiogram. Order 01/07/14.  3. Abnormal EKG-ST segment depression noted in the inferior/lateral leads. No segment elevation. This is consistent with ischemia. Heart catheterization pending.   4. Hypertension-beta blocker.  Low-dose hydrochlorothiazide. Add ACE inhibitor if necessary.   5. Elevated glucose-254 on arrival. Could certainly have diabetes. Checking hemoglobin A1c. May benefit from metformin in the future.    Dondi Burandt 01/08/2014, 12:01 PM     

## 2014-01-08 NOTE — Progress Notes (Signed)
  Echocardiogram 2D Echocardiogram has been performed.  William [name] William [name] 01/08/2014, 4:34 PM

## 2014-01-09 ENCOUNTER — Encounter (HOSPITAL_COMMUNITY): Payer: Self-pay | Admitting: Interventional Cardiology

## 2014-01-09 ENCOUNTER — Encounter (HOSPITAL_COMMUNITY)
Admission: EM | Disposition: A | Discharge status: Home / Self Care | Payer: BC Managed Care – PPO | Source: Home / Self Care | Attending: Internal Medicine

## 2014-01-09 DIAGNOSIS — I2510 Atherosclerotic heart disease of native coronary artery without angina pectoris: Secondary | ICD-10-CM

## 2014-01-09 HISTORY — PX: LEFT HEART CATHETERIZATION WITH CORONARY ANGIOGRAM: SHX5451

## 2014-01-09 LAB — POCT ACTIVATED CLOTTING TIME
Activated Clotting Time: 265 seconds
Activated Clotting Time: 287 s

## 2014-01-09 LAB — GLUCOSE, CAPILLARY
Glucose-Capillary: 188 mg/dL — ABNORMAL HIGH (ref 70–99)
Glucose-Capillary: 146 mg/dL — ABNORMAL HIGH (ref 70–99)
Glucose-Capillary: 187 mg/dL — ABNORMAL HIGH (ref 70–99)
Glucose-Capillary: 141 mg/dL — ABNORMAL HIGH (ref 70–99)

## 2014-01-09 LAB — HEPARIN LEVEL (UNFRACTIONATED): HEPARIN UNFRACTIONATED: 0.39 [IU]/mL (ref 0.30–0.70)

## 2014-01-09 LAB — HEMOGLOBIN A1C
Hgb A1c MFr Bld: 8.9 % — ABNORMAL HIGH (ref <5.7)
Mean Plasma Glucose: 209 mg/dL — ABNORMAL HIGH (ref <117)

## 2014-01-09 LAB — PLATELET COUNT: Platelets: 226 10*3/uL (ref 150–400)

## 2014-01-09 SURGERY — LEFT HEART CATHETERIZATION WITH CORONARY ANGIOGRAM
Anesthesia: LOCAL

## 2014-01-09 MED ORDER — FENTANYL CITRATE 0.05 MG/ML IJ SOLN
INTRAMUSCULAR | Status: AC
  Filled 2014-01-09: qty 2

## 2014-01-09 MED ORDER — TICAGRELOR 90 MG PO TABS
90.0000 mg | ORAL_TABLET | Freq: Two times a day (BID) | ORAL | Status: DC
  Administered 2014-01-09 – 2014-01-10 (×2): 90 mg via ORAL
  Filled 2014-01-09 (×3): qty 1

## 2014-01-09 MED ORDER — ASPIRIN 81 MG PO CHEW
81.0000 mg | CHEWABLE_TABLET | Freq: Every day | ORAL | Status: DC
  Administered 2014-01-10: 81 mg via ORAL
  Filled 2014-01-09: qty 1

## 2014-01-09 MED ORDER — HEPARIN SODIUM (PORCINE) 1000 UNIT/ML IJ SOLN
INTRAMUSCULAR | Status: AC
  Filled 2014-01-09: qty 1

## 2014-01-09 MED ORDER — TIROFIBAN HCL IV 5 MG/100ML
0.1500 ug/kg/min | INTRAVENOUS | Status: AC
  Administered 2014-01-09: 13:00:00 0.15 ug/kg/min via INTRAVENOUS
  Filled 2014-01-09 (×3): qty 100

## 2014-01-09 MED ORDER — HEPARIN (PORCINE) IN NACL 2-0.9 UNIT/ML-% IJ SOLN
INTRAMUSCULAR | Status: AC
  Filled 2014-01-09: qty 1000

## 2014-01-09 MED ORDER — MIDAZOLAM HCL 2 MG/2ML IJ SOLN
INTRAMUSCULAR | Status: AC
  Filled 2014-01-09: qty 2

## 2014-01-09 MED ORDER — LIVING WELL WITH DIABETES BOOK
Freq: Once | Status: AC
  Administered 2014-01-09: 13:00:00
  Filled 2014-01-09: qty 1

## 2014-01-09 MED ORDER — VERAPAMIL HCL 2.5 MG/ML IV SOLN
INTRAVENOUS | Status: AC
  Filled 2014-01-09: qty 2

## 2014-01-09 MED ORDER — NITROGLYCERIN 0.2 MG/ML ON CALL CATH LAB
INTRAVENOUS | Status: AC
  Filled 2014-01-09: qty 1

## 2014-01-09 MED ORDER — SODIUM CHLORIDE 0.9 % IV SOLN: 1.0000 mL/kg/h | INTRAVENOUS | Status: AC

## 2014-01-09 MED ORDER — LIDOCAINE HCL (PF) 1 % IJ SOLN
INTRAMUSCULAR | Status: AC
  Filled 2014-01-09: qty 30

## 2014-01-09 MED ORDER — TICAGRELOR 90 MG PO TABS
ORAL_TABLET | ORAL | Status: AC
  Administered 2014-01-09: 22:00:00 90 mg via ORAL
  Filled 2014-01-09: qty 2

## 2014-01-09 NOTE — H&P (View-Only) (Signed)
Subjective:  Feels better, no chest pain, no chest tightness, no shortness of breath. Awaiting cardiac catheterization tomorrow.  Objective:  Vital Signs in the last 24 hours: Temp:  [98.2 F (36.8 C)-99.2 F (37.3 C)] 99.2 F (37.3 C) (02/22 0529) Pulse Rate:  [61-78] 61 (02/22 0916) Resp:  [15-20] 18 (02/22 0529) BP: (127-142)/(59-72) 127/63 mmHg (02/22 0916) SpO2:  [98 %-100 %] 98 % (02/22 0529) Weight:  [215 lb 11.2 oz (97.841 kg)] 215 lb 11.2 oz (97.841 kg) (02/21 1900)  Intake/Output from previous day: 02/21 0701 - 02/22 0700 In: 648.5 [P.O.:600; I.V.:48.5] Out: -    Physical Exam: General: Well developed, well nourished, in no acute distress. Head:  Normocephalic and atraumatic. Lungs: Clear to auscultation and percussion. Heart: Normal S1 and S2.  No murmur, rubs or gallops.  Abdomen: soft, non-tender, positive bowel sounds. Extremities: No clubbing or cyanosis. No edema. Neurologic: Alert and oriented x 3.    Lab Results:  Recent Labs  01/07/14 0342 01/08/14 0607  WBC 9.0 6.9  HGB 14.1 13.3  PLT 246 222    Recent Labs  01/07/14 0342 01/08/14 0607  NA 136* 137  K 4.1 4.0  CL 96 102  CO2 26 25  GLUCOSE 254* 186*  BUN 18 17  CREATININE 0.93 0.82    Recent Labs  01/07/14 1257 01/07/14 1951  TROPONINI 7.19* 5.05*   Hepatic Function Panel  Recent Labs  01/07/14 0342  PROT 7.8  ALBUMIN 3.3*  AST 43*  ALT 24  ALKPHOS 48  BILITOT 0.6    Recent Labs  01/08/14 0607  CHOL 105   No results found for this basename: PROTIME,  in the last 72 hours  Imaging: Dg Chest Portable 1 View  01/07/2014   CLINICAL DATA:  Chest discomfort and shortness of breath.  EXAM: PORTABLE CHEST - 1 VIEW  COMPARISON:  None.  FINDINGS: The lungs are well-aerated and clear. There is no evidence of focal opacification, pleural effusion or pneumothorax.  The cardiomediastinal silhouette is within normal limits. No acute osseous abnormalities are seen.   IMPRESSION: No acute cardiopulmonary process seen.   Electronically Signed   By: Garald Balding M.D.   On: 01/07/2014 04:26   Personally viewed.   Telemetry: No adverse arrhythmias Personally viewed.   EKG:  ST segment depression noted  Cardiac Studies:  Echo pending  Assessment/Plan:  Active Problems:   NSTEMI (non-ST elevated myocardial infarction)   HTN (hypertension)  58 year old male with no prior cardiovascular history with non-ST elevation myocardial infarction.   1. Non-ST elevation myocardial infarction-troponin is now trending down from 7-5. He is currently chest pain-free. No shortness of breath. I discussed with him cardiac catheterization. If symptoms worsen we will proceed urgently with heart cath. If not, Monday. Excellent radial pulse. Continue with IV heparin, metoprolol, nitroglycerin patch, aspirin, atorvastatin. I will not administer Plavix in case surgical disease is present. He understands that he is at increased risk over the next 30 days. I will write precath orders.  Discussed at length risks and benefits of heart catheterization including stroke, heart attack, death, renal impairment, bleeding with he and his family. 4 children are present. Wife is present. She did ask several questions. She asked about the importance of dental health and heart disease.  2. Heart murmur-I will check echocardiogram. Order 01/07/14.  3. Abnormal EKG-ST segment depression noted in the inferior/lateral leads. No segment elevation. This is consistent with ischemia. Heart catheterization pending.   4. Hypertension-beta blocker.  Low-dose hydrochlorothiazide. Add ACE inhibitor if necessary.   5. Elevated glucose-254 on arrival. Could certainly have diabetes. Checking hemoglobin A1c. May benefit from metformin in the future.    SKAINS, William [name] 01/08/2014, 12:01 PM

## 2014-01-09 NOTE — Care Management Note (Signed)
    Page 1 of 1   01/09/2014     4:24:27 PM   CARE MANAGEMENT NOTE 01/09/2014  Patient:  AMITAI, DELAUGHTER   Account Number:  [id number]  Date Initiated:  01/09/2014  Documentation initiated by:  Fuller Mandril  Subjective/Objective Assessment:   58 yr old male with hx of HTN , prior smoker presents with chest pain//Home with spouse     Action/Plan:   PROCEDURE:  Left heart catheterization with selective coronary angiography, left ventriculogram.  PCI OM1; PCI LAD//Benefits check for Brilinta 90mg  BID   Anticipated DC Date:  01/10/2014   Anticipated DC Plan:  [place]  CM consult      Choice offered to / List presented to:             Status of service:   Medicare Important Message given?   (If response is "NO", the following Medicare IM given date fields will be blank) Date Medicare IM given:   Date Additional Medicare IM given:    Discharge Disposition:    Per UR Regulation:    If discussed at Long Length of Stay Meetings, dates discussed:    Comments:  01/09/14 [place], RN, BSN, [employer] [phone] Spoke with pt at bedside regarding benefits check for Brilinta.  Pt has brochure with 30 day free card and refill assistance card intact.  Pt utilizes [facility] on [address] for prescription needs.  NCM called pharmacy to confirm availability of medication.  Information relayed to pt.  Pt verbalizes importance of filling medication upon discharge.

## 2014-01-09 NOTE — CV Procedure (Addendum)
PROCEDURE:  Left heart catheterization with selective coronary angiography, left ventriculogram.  PCI OM1; PCI LAD  INDICATIONS:  NSTEMI  The risks, benefits, and details of the procedure were explained to the patient.  The patient verbalized understanding and wanted to proceed.  Informed written consent was obtained.  PROCEDURE TECHNIQUE:  After Xylocaine anesthesia a 26F slender sheath was placed in the right radial artery with a single anterior needle wall stick.   Heparin was given intravenously. Right coronary angiography was done using a Williams right guide catheter.  Left coronary angiography was done using a Judkins L3.0 guide catheter.  Left heart catheterization was done using a Williams right catheter.  The intervention was performed.  A TR band was used for hemostasis.   CONTRAST:  Total of 135 cc.  COMPLICATIONS:  None.    HEMODYNAMICS:  Aortic pressure was 114/61; LV pressure was 117/12; LVEDP 20.  There was no gradient between the left ventricle and aorta.    ANGIOGRAPHIC DATA:   The left main coronary artery is widely patent.  The left anterior descending artery is a large vessel which wraps around the apex. In the proximal LAD, there is a 75% stenosis. The mid LAD, there is a focal 80% stenosis. There several small diagonals which are widely patent.  The left circumflex artery is a large vessel. The first obtuse marginal is large with mild, proximal disease. In the mid first obtuse marginal, there is a 99% focal stenosis, which is likely the culprit for his presentation. In the distal OM1, there is diffuse disease in the terminal branches.  The right coronary artery is a large dominant vessel. There is mild, diffuse atherosclerosis in the proximal and the distal vessel. The PDA is large in caliber proximally and has mild, proximal disease. The posterior lateral artery is medium-sized and patent.  LEFT VENTRICULOGRAM:  Left ventricular angiogram was not done.  LVEDP  was 20 mmHg.  PCI NARRATIVE: IV heparin and tirofiban were used. An ACT was used to check that the patient was properly anticoagulated. A CLS 3.0 guiding catheters using his left main. A pro-water wire was placed across the lesion in the OM1. A 2.5 x 12 balloon was used to predilate the lesion. A 2.5 by 16 mm Promus drug-eluting stent was used to treat the lesion. The stent was post dilated with a 2.75 x 12 noncompliant balloon. There was an excellent angiographic result.  The pro-water wire was then redirected into the LAD. The mid LAD lesion was direct stented with a 3.0 x 12 promus drug-eluting stent.  The stent was post dilated with a 3.25 x 8 noncompliant balloon. There was an excellent angiographic result.  The proximal LAD lesion was then predilated with a 2.5 x 12 balloon. A 3.0 x 16 Promus drug-eluting stent was deployed. The stent was post dilated with a 3.5 x 12 noncompliant balloon , up to 18 atmospheres. Several doses of nitroglycerin intracoronary were administered. There was an excellent angiographic result. The patient did have typical angina with balloon inflations.  IMPRESSIONS:  1. Normal left main coronary artery. 2. 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 Both are focal. The mid LAD was treated with a 3.0 x 12 promus drug-eluting stent, postdilated to 3.3 mm in diameter. The proximal LAD was treated with a 3.0 x 16 promus drug-eluting stent, postdilated to 3.6 mm. 3. Large left circumflex artery and its branches.  99% stenosis in the mid OM1. This  was the culprit lesion for his non-STEMI. This was successfully treated with a 2.5 x 16 Promus drug-eluting stent, postdilated to 2.8 mm in diameter 4. Mild, diffuse disease in the right coronary artery . 5. LVEDP 20 mmHg.  Ejection fraction 50-55 % by recent echocardiogram. Ventriculogram was not performed.  RECOMMENDATION:  He only aggressive secondary prevention. He'll need dual antiplatelet therapy for at  least a year. I stressed the importance of this with the patient and with his wife. They're in agreement. He will be watched overnight.  Hopefully, he can be discharged tomorrow.  We'll continue tirofiban for 6 hours. He received Brilinta at the end of the procedure.  There was some bleeding noted from his gums. He does have some teeth that he states need to be pulled. We'll have to manage this with his antiplatelet therapy.

## 2014-01-09 NOTE — Progress Notes (Signed)
TR BAND REMOVAL  LOCATION:    right radial  DEFLATED PER PROTOCOL:    yes  TIME BAND OFF / DRESSING APPLIED:    1500   SITE UPON ARRIVAL:    Level 0  SITE AFTER BAND REMOVAL:    Level 0  REVERSE ALLEN'S TEST:     positive  CIRCULATION SENSATION AND MOVEMENT:    Within Normal Limits   yes  COMMENTS:   Tolerated procedure well

## 2014-01-09 NOTE — Interval H&P Note (Signed)
Cath Lab Visit (complete for each Cath Lab visit)  Clinical Evaluation Leading to the Procedure:   ACS: yes  Non-ACS:    Anginal Classification: CCS IV  Anti-ischemic medical therapy: Maximal Therapy (2 or more classes of medications)  Non-Invasive Test Results: No non-invasive testing performed  Prior CABG: No previous CABG      History and Physical Interval Note:  01/09/2014 9:00 AM  William [name]  has presented today for surgery, with the diagnosis of cp  The various methods of treatment have been discussed with the patient and family. After consideration of risks, benefits and other options for treatment, the patient has consented to  Procedure(s): LEFT HEART CATHETERIZATION WITH CORONARY ANGIOGRAM (N/A) as a surgical intervention .  The patient's history has been reviewed, patient examined, no change in status, stable for surgery.  I have reviewed the patient's chart and labs.  Questions were answered to the patient's satisfaction.     Drayven Marchena S.

## 2014-01-09 NOTE — Progress Notes (Signed)
Inpatient Diabetes Program Recommendations  AACE/ADA: New Consensus Statement on Inpatient Glycemic Control (2013)  Target Ranges:  Prepandial:   less than 140 mg/dL      Peak postprandial:   less than 180 mg/dL (1-2 hours)      Critically ill patients:  140 - 180 mg/dL     Results for William [name], William [name] (MRN [id number]) as of 01/09/2014 13:05  Ref. Range 01/07/2014 03:42  Glucose Latest Range: 70-99 mg/dL 254 (H)    Results for William [name], William [name] (MRN [id number]) as of 01/09/2014 13:05  Ref. Range 01/08/2014 06:07  Hemoglobin A1C Latest Range: <5.7 % 9.0 (H)    **Glucose 254 mg/dl on admission.  A1c 9%.  No mention of DM in H&P.    **S/P cardiac cath today.  Note patient may d/c home tomorrow.  **Spoke to patient about his current A1c of 9% (01/08/13).  Explained what an A1c is and what it measures.  Patient and his wife told me that patient has never been diagnosed with DM prior to admission.  Patient sees Dr. [name] for his medical issues.  **Explained to patient that he needs to follow up closely with his PCP.  Explained to patient that he has many risk factors for DM and that his A1c is indicative of DM.  Asked patient to please speak further with the cardiologists here about his elevated glucose levels and elevated A1c.  Living Well with DM booklet given to patient for review.  Patient told me he plans to see Dr. Criss Rosales tomorrow after d/c from the hospital.   Will follow. Wyn Quaker RN, MSN, CDE Diabetes Coordinator Inpatient Diabetes Program Team Pager: [phone] (8a-10p)

## 2014-01-09 NOTE — Progress Notes (Signed)
Outcome from cath noted. He will be followed. Per Dr. Lendell Caprice, may be candidate for discharge tomorrow.

## 2014-01-10 ENCOUNTER — Encounter (HOSPITAL_COMMUNITY): Payer: Self-pay | Admitting: Physician Assistant

## 2014-01-10 DIAGNOSIS — E119 Type 2 diabetes mellitus without complications: Secondary | ICD-10-CM | POA: Diagnosis present

## 2014-01-10 LAB — CBC
HEMATOCRIT: 36.8 % — AB (ref 39.0–52.0)
HEMOGLOBIN: 12.3 g/dL — AB (ref 13.0–17.0)
MCH: 25.5 pg — ABNORMAL LOW (ref 26.0–34.0)
MCHC: 33.4 g/dL (ref 30.0–36.0)
MCV: 76.3 fL — ABNORMAL LOW (ref 78.0–100.0)
Platelets: 230 10*3/uL (ref 150–400)
RBC: 4.82 MIL/uL (ref 4.22–5.81)
RDW: 13.2 % (ref 11.5–15.5)
WBC: 6.3 10*3/uL (ref 4.0–10.5)

## 2014-01-10 LAB — GLUCOSE, CAPILLARY
GLUCOSE-CAPILLARY: 181 mg/dL — AB (ref 70–99)
Glucose-Capillary: 222 mg/dL — ABNORMAL HIGH (ref 70–99)

## 2014-01-10 LAB — BASIC METABOLIC PANEL
BUN: 13 mg/dL (ref 6–23)
CO2: 25 mEq/L (ref 19–32)
Calcium: 8.8 mg/dL (ref 8.4–10.5)
Chloride: 105 mEq/L (ref 96–112)
Creatinine, Ser: 0.86 mg/dL (ref 0.50–1.35)
GFR calc Af Amer: >90 mL/min (ref >90)
GLUCOSE: 146 mg/dL — AB (ref 70–99)
POTASSIUM: 4.8 meq/L (ref 3.7–5.3)
Sodium: 140 mEq/L (ref 137–147)

## 2014-01-10 MED ORDER — TICAGRELOR 90 MG PO TABS: 90.0000 mg | ORAL_TABLET | Freq: Two times a day (BID) | ORAL | Status: DC

## 2014-01-10 MED ORDER — METOPROLOL TARTRATE 25 MG PO TABS: 25.0000 mg | ORAL_TABLET | Freq: Two times a day (BID) | ORAL | Status: DC

## 2014-01-10 MED ORDER — ASPIRIN EC 81 MG PO TBEC: 81.0000 mg | DELAYED_RELEASE_TABLET | Freq: Every day | ORAL | Status: AC

## 2014-01-10 MED ORDER — IBUPROFEN 200 MG PO TABS: 400.0000 mg | ORAL_TABLET | Freq: Three times a day (TID) | ORAL | Status: DC | PRN

## 2014-01-10 MED ORDER — NITROGLYCERIN 0.4 MG SL SUBL: 0.4000 mg | SUBLINGUAL_TABLET | SUBLINGUAL | Status: AC | PRN

## 2014-01-10 MED ORDER — AMLODIPINE-VALSARTAN-HCTZ 10-160-12.5 MG PO TABS: 0.5000 | ORAL_TABLET | Freq: Every day | ORAL | Status: DC

## 2014-01-10 MED ORDER — ATORVASTATIN CALCIUM 40 MG PO TABS: 40.0000 mg | ORAL_TABLET | Freq: Every day | ORAL | Status: DC

## 2014-01-10 NOTE — Progress Notes (Signed)
Patient Name: William [name] Date of Encounter: 01/10/2014  Active Problems:   NSTEMI (non-ST elevated myocardial infarction)   HTN (hypertension)   Acute myocardial infarction of other lateral wall, initial episode of care    Diabetes  SUBJECTIVE: No chest pain, no SOB, never been told he's a diabetic  OBJECTIVE Filed Vitals:   01/09/14 1900 01/09/14 2110 01/10/14 0007 01/10/14 0516  BP: 159/64 175/61 157/78 154/65  Pulse:  67 65 71  Temp:  98.7 F (37.1 C) 99 F (37.2 C) 98.7 F (37.1 C)  TempSrc:  Oral Oral Oral  Resp:  18 18 17   Height:      Weight:   216 lb 11.4 oz (98.3 kg)   SpO2:  99% 98% 98%    Intake/Output Summary (Last 24 hours) at 01/10/14 0657 Last data filed at 01/10/14 0526  Gross per 24 hour  Intake 1814.21 ml  Output   1350 ml  Net 464.21 ml   Filed Weights   01/07/14 0722 01/07/14 1900 01/10/14 0007  Weight: 214 lb 1.6 oz (97.115 kg) 215 lb 11.2 oz (97.841 kg) 216 lb 11.4 oz (98.3 kg)    PHYSICAL EXAM General: Well developed, well nourished, male in no acute distress. Head: Normocephalic, atraumatic.  Neck: Supple without bruits, JVD not elevated. Lungs:  Resp regular and unlabored, very few rales bases. Heart: RRR, S1, S2, no S3, S4, 2-36 murmur; no rub. Abdomen: Soft, non-tender, non-distended, BS + x 4.  Extremities: No clubbing, cyanosis, no edema. Right radial with some edema, no hematoma Neuro: Alert and oriented X 3. Moves all extremities spontaneously. Psych: Normal affect.  LABS: CBC: Recent Labs  01/08/14 0607 01/09/14 2226 01/10/14 0520  WBC 6.9  --  6.3  HGB 13.3  --  12.3*  HCT 38.5*  --  36.8*  MCV 76.1*  --  76.3*  PLT 222 226 230   INR: Recent Labs  01/08/14 0607  INR 6.44   Basic Metabolic Panel: Recent Labs  01/08/14 0607 01/10/14 0520  NA 137 140  K 4.0 4.8  CL 102 105  CO2 25 25  GLUCOSE 186* 146*  BUN 17 13  CREATININE 0.82 0.86  CALCIUM 8.6 8.8    Cardiac Enzymes: Recent Labs  01/07/14 0855 01/07/14 1257 01/07/14 1951  TROPONINI 7.27* 7.19* 5.05*   Hemoglobin A1C: Recent Labs  01/09/14 0510  HGBA1C 8.9*   Fasting Lipid Panel: Recent Labs  01/08/14 0607  CHOL 105  HDL 41  LDLCALC 49  TRIG 73  CHOLHDL 2.6   TELE:        ECG: 01/10/2014 Vent. rate 65 BPM PR interval 252 ms QRS duration 106 ms QT/QTc 412/428 ms P-R-T axes 46 32 162  Echo: 01/08/2014 Study Conclusions - Left ventricle: The cavity size was normal. There was mild concentric hypertrophy. Systolic function was normal. The estimated ejection fraction was in the range of 55% to 60%. Wall motion was normal; there were no regional wall motion abnormalities. - Aortic valve: Trileaflet; mildly thickened leaflets. - Left atrium: The atrium was moderately dilated. - Atrial septum: There was increased thickness of the septum, consistent with lipomatous hypertrophy.   Cardiac cath:  IMPRESSIONS:  1. Normal left main coronary artery. 2. 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 Both are focal. The mid LAD was treated with a 3.0 x 12 promus drug-eluting stent, postdilated to 3.3 mm in diameter. The proximal LAD was treated with a 3.0 x 16 promus  drug-eluting stent, postdilated to 3.6 mm. 3. Large left circumflex artery and its branches. 99% stenosis in the mid OM1. This was the culprit lesion for his non-STEMI. This was successfully treated with a 2.5 x 16 Promus drug-eluting stent, postdilated to 2.8 mm in diameter 4. Mild, diffuse disease in the right coronary artery . 5. LVEDP 20 mmHg. Ejection fraction 50-55 % by recent echocardiogram. Ventriculogram was not performed.    Current Medications:  . aspirin  81 mg Oral Daily  . atorvastatin  40 mg Oral q1800  . insulin aspart  0-15 Units Subcutaneous TID WC  . insulin aspart  0-5 Units Subcutaneous QHS  . metoprolol tartrate  25 mg Oral BID  . Ticagrelor  90 mg Oral BID   Prior to Admission medications     Medication Sig Start Date End Date Taking? Authorizing Provider  Amlodipine-Valsartan-HCTZ 10-160-12.5 MG TABS Take 1 tablet by mouth daily.   Yes Historical Provider, MD  ibuprofen (ADVIL,MOTRIN) 200 MG tablet Take 400-600 mg by mouth every 8 (eight) hours as needed for mild pain.   Yes Historical Provider, MD    ASSESSMENT AND PLAN: Principal Problem:   Acute myocardial infarction of other lateral wall, initial episode of care - See cath report above, he is on ASA/BB/statin/Brilinta.  Active Problems:   HTN (hypertension) - think home Rx a little too strong with BB but SBP running 150s now. MD advise on adding lisinopril/HCTZ for now. Otherwise per Dr. Criss Rosales.    Diabetes mellitus - will refer to OP DM education and put on ADA diet. Will leave Rx to Dr. Criss Rosales, he has appt today, if ofc not closed.     Murmur - no significant valve probs by echo, EF preserved. Follow  Plan - d/c today.    SignedRosaria Ferries , PA-C 6:57 AM 01/10/2014    Patient seen and examined. Agree with assessment and plan. No further chest pain. Feels well; for dc today   Troy Sine, MD, [facility] 01/10/2014 10:24 AM

## 2014-01-10 NOTE — Progress Notes (Addendum)
CARDIAC REHAB PHASE I   Pt walking independently without problems. Very receptive to ed. Has been making changes but has not had formal education. Discussed diet and exercise. Interested in [place] and will send referral to [place]. Needs outpt DM classes. [phone]  Josephina Shih [place] CES, ACSM 01/10/2014 9:27 AM

## 2014-01-10 NOTE — Discharge Summary (Signed)
CARDIOLOGY DISCHARGE SUMMARY   Patient ID: William [name] MRN: [id number] DOB/AGE: 1956-04-03 58 y.o.  Admit date: 01/07/2014 Discharge date: 01/10/2014  PCP: Elyn Peers, MD Primary Cardiologist: [facility]  Primary Discharge Diagnosis:   Acute myocardial infarction of other lateral wall, initial episode of care  Secondary Discharge Diagnosis:    HTN (hypertension)   Diabetes mellitus  Procedures: Left heart catheterization with selective coronary angiography, left ventriculogram. PCI OM1; PCI LAD; 2 d echocardiogram   Hospital Course: William [name] is a 58 y.o. male with no history of CAD. He was experiencing chest discomfort for a few days. He came to the hospital and his cardiac enzymes were elevated. He was admitted for further evaluation and treatment.  He ruled out for a non-ST elevation MI. He was pain free on aspirin, heparin, nitrates and a beta blocker. He also had a statin added to his medication regimen. Because of the beta blocker, his home blood pressure medication was held. It is restarted at discharge, at 1/2 tablet daily. He is to check his BP at home and bring readings into the office, to see if control is adequate on the new regimen.   Upon reviewing his labs, his blood sugars were shown to be significantly elevated. A hemoglobin A1c was also elevated, results below. He was placed on a diabetic diet and is to followup with his primary care physician. His lipid profile was also performed, results below.  He was taken to the cath lab on 02/23. Results are below. He received a total of 3 drug-eluting stents, 2 to the LAD and one to the circumflex. He tolerated the procedure well. He had a small amount of ecchymosis/edema at his right radial cath site but no hematoma.  On 2/24, he was seen by Dr. Claiborne Billings and by cardiac rehabilitation. No further inpatient workup was indicated and he is considered stable for discharge, to follow up as an outpatient.  Labs:   Lab  Results  Component Value Date   WBC 6.3 01/10/2014   HGB 12.3* 01/10/2014   HCT 36.8* 01/10/2014   MCV 76.3* 01/10/2014   PLT 230 01/10/2014    Recent Labs Lab 01/07/14 0342  01/10/14 0520  NA 136*  < > 140  K 4.1  < > 4.8  CL 96  < > 105  CO2 26  < > 25  BUN 18  < > 13  CREATININE 0.93  < > 0.86  CALCIUM 9.0  < > 8.8  PROT 7.8  --   --   BILITOT 0.6  --   --   ALKPHOS 48  --   --   ALT 24  --   --   AST 43*  --   --   GLUCOSE 254*  < > 146*  < > = values in this interval not displayed.  Recent Labs  01/07/14 1257 01/07/14 1951  TROPONINI 7.19* 5.05*   Lipid Panel     Component Value Date/Time   CHOL 105 01/08/2014 0607   TRIG 73 01/08/2014 0607   HDL 41 01/08/2014 0607   CHOLHDL 2.6 01/08/2014 0607   VLDL 15 01/08/2014 0607   LDLCALC 49 01/08/2014 0607   Lab Results  Component Value Date   HGBA1C 8.9* 01/09/2014    Recent Labs  01/08/14 0607  INR 1.04      Radiology: Dg Chest Portable 1 View 01/07/2014   CLINICAL DATA:  Chest discomfort and shortness of breath.  EXAM: PORTABLE CHEST -  1 VIEW  COMPARISON:  None.  FINDINGS: The lungs are well-aerated and clear. There is no evidence of focal opacification, pleural effusion or pneumothorax.  The cardiomediastinal silhouette is within normal limits. No acute osseous abnormalities are seen.  IMPRESSION: No acute cardiopulmonary process seen.   Electronically Signed   By: Garald Balding M.D.   On: 01/07/2014 04:26    Cardiac Cath:  ANGIOGRAPHIC DATA: The left main coronary artery is widely patent.  The left anterior descending artery is a large vessel which wraps around the apex. In the proximal LAD, there is a 75% stenosis. The mid LAD, there is a focal 80% stenosis. There several small diagonals which are widely patent.  The left circumflex artery is a large vessel. The first obtuse marginal is large with mild, proximal disease. In the mid first obtuse marginal, there is a 99% focal stenosis, which is likely the culprit  for his presentation. In the distal OM1, there is diffuse disease in the terminal branches.  The right coronary artery is a large dominant vessel. There is mild, diffuse atherosclerosis in the proximal and the distal vessel. The PDA is large in caliber proximally and has mild, proximal disease. The posterior lateral artery is medium-sized and patent.  LEFT VENTRICULOGRAM: Left ventricular angiogram was not done. LVEDP was 20 mmHg.  PCI NARRATIVE: IV heparin and tirofiban were used. An ACT was used to check that the patient was properly anticoagulated. A CLS 3.0 guiding catheters using his left main. A pro-water wire was placed across the lesion in the OM1. A 2.5 x 12 balloon was used to predilate the lesion. A 2.5 by 16 mm Promus drug-eluting stent was used to treat the lesion. The stent was post dilated with a 2.75 x 12 noncompliant balloon. There was an excellent angiographic result.  The pro-water wire was then redirected into the LAD. The mid LAD lesion was direct stented with a 3.0 x 12 promus drug-eluting stent. The stent was post dilated with a 3.25 x 8 noncompliant balloon. There was an excellent angiographic result.  The proximal LAD lesion was then predilated with a 2.5 x 12 balloon. A 3.0 x 16 Promus drug-eluting stent was deployed. The stent was post dilated with a 3.5 x 12 noncompliant balloon , up to 18 atmospheres. Several doses of nitroglycerin intracoronary were administered. There was an excellent angiographic result. The patient did have typical angina with balloon inflations.  IMPRESSIONS:  1. Normal left main coronary artery. 2. 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 Both are focal. The mid LAD was treated with a 3.0 x 12 promus drug-eluting stent, postdilated to 3.3 mm in diameter. The proximal LAD was treated with a 3.0 x 16 promus drug-eluting stent, postdilated to 3.6 mm. 3. Large left circumflex artery and its branches. 99% stenosis in the mid OM1. This  was the culprit lesion for his non-STEMI. This was successfully treated with a 2.5 x 16 Promus drug-eluting stent, postdilated to 2.8 mm in diameter 4. Mild, diffuse disease in the right coronary artery . 5. LVEDP 20 mmHg. Ejection fraction 50-55 % by recent echocardiogram. Ventriculogram was not performed. RECOMMENDATION: He only aggressive secondary prevention. He'll need dual antiplatelet therapy for at least a year. I stressed the importance of this with the patient and with his wife. They're in agreement. He will be watched overnight. Hopefully, he can be discharged tomorrow. We'll continue tirofiban for 6 hours. He received Brilinta at the end of the procedure. There was  some bleeding noted from his gums. He does have some teeth that he states need to be pulled. We'll have to manage this with his antiplatelet therapy.  ECG: 01/10/2014  Vent. rate 65 BPM  PR interval 252 ms  QRS duration 106 ms  QT/QTc 412/428 ms  P-R-T axes 46 32 162   Echo: 01/08/2014  Study Conclusions - Left ventricle: The cavity size was normal. There was mild concentric hypertrophy. Systolic function was normal. The estimated ejection fraction was in the range of 55% to 60%. Wall motion was normal; there were no regional wall motion abnormalities. - Aortic valve: Trileaflet; mildly thickened leaflets. - Left atrium: The atrium was moderately dilated. - Atrial septum: There was increased thickness of the septum, consistent with lipomatous hypertrophy.   FOLLOW UP PLANS AND APPOINTMENTS Allergies  Allergen Reactions  . Shellfish Allergy Hives     Medication List         Amlodipine-Valsartan-HCTZ 10-160-12.5 MG Tabs  Take 0.5 tablets by mouth daily. For now, take 1/2 tab daily. Check BP as outpatient and bring to MD.     aspirin EC 81 MG tablet  Take 1 tablet (81 mg total) by mouth daily.     atorvastatin 40 MG tablet  Commonly known as:  LIPITOR  Take 1 tablet (40 mg total) by mouth daily at 6 PM.       ibuprofen 200 MG tablet  Commonly known as:  ADVIL,MOTRIN  Take 2 tablets (400 mg total) by mouth every 8 (eight) hours as needed for mild pain.     metoprolol tartrate 25 MG tablet  Commonly known as:  LOPRESSOR  Take 1 tablet (25 mg total) by mouth 2 (two) times daily.     nitroGLYCERIN 0.4 MG SL tablet  Commonly known as:  NITROSTAT  Place 1 tablet (0.4 mg total) under the tongue every 5 (five) minutes as needed for chest pain.     Ticagrelor 90 MG Tabs tablet  Commonly known as:  BRILINTA  Take 1 tablet (90 mg total) by mouth 2 (two) times daily.        Discharge Orders   Future Appointments Provider Department Dept Phone   01/18/2014 3:20 PM Liliane Shi, PA-C [facility] [phone]   Future Orders Complete By Expires   Amb Referral to Cardiac Rehabilitation  As directed    Ambulatory referral to Nutrition and Diabetic Education  As directed    Comments:     New diagnosis of DM.  A1c 8.9% (01/09/14).  PCP is Dr. [name].  Patient: Please call the [place] after discharge to schedule an appointment for diabetes education if you do not hear from the center before discharge  [phone]   Diet - low sodium heart healthy  As directed    Diet Carb Modified  As directed    Increase activity slowly  As directed      Follow-up Information   Follow up with Richardson Dopp, PA-C On 01/18/2014. (See for Dr. Marlou Porch at 3:20 pm.)    Specialty:  Physician Assistant   Contact information:   [id number] [place] [address] [phone]       Schedule an appointment as soon as possible for a visit with Elyn Peers, MD.   Specialty:  Family Medicine   Contact information:   [place] [address] [phone]       [facility] ALL MEDICATIONS  WITH YOU TO FOLLOW UP APPOINTMENTS  Time spent with patient to include physician time:  min Signed: Rosaria Ferries,  PA-C 01/10/2014, 11:46 AM Co-Sign MD

## 2014-01-10 NOTE — Plan of Care (Signed)
Problem: Food- and Nutrition-Related Knowledge Deficit (NB-1.1) Goal: Nutrition education Formal process to instruct or train a patient/client in a skill or to impart knowledge to help patients/clients voluntarily manage or modify food choices and eating behavior to maintain or improve health. Outcome: Completed/Met Date Met:  01/10/14  RD consulted for nutrition education regarding diabetes.  Per recall pt is eating healthy foods but is eating large quantities of these foods. Pt will eat only CHO at a meal and then only eat non-CHO at the next meal and is not eating a balance of either.  Pt is very receptive to ed, however when wife enters the room she asks a lot of questions and at times interrupts education stating that pt isn't telling the whole truth and both husband and children get frustrated with her.     Lab Results  Component Value Date    HGBA1C 8.9* 01/09/2014    RD provided "Carbohydrate Counting for People with Diabetes" handout from the Academy of Nutrition and Dietetics. Discussed different food groups and their effects on blood sugar, emphasizing carbohydrate-containing foods. Provided list of carbohydrates and recommended serving sizes of common foods.  Discussed importance of controlled and consistent carbohydrate intake throughout the day. Provided examples of ways to balance meals/snacks and encouraged intake of high-fiber, whole grain complex carbohydrates. Teach back method used.  Expect good compliance.  Body mass index is 31.09 kg/(m^2). Pt meets criteria for obesity class i based on current BMI.  Current diet order is CHO Modified, patient is consuming approximately 100% of meals at this time. Labs and medications reviewed. No further nutrition interventions warranted at this time. RD contact information provided. If additional nutrition issues arise, please re-consult RD.  [place], [place], [place] Pager [phone] After Hours Pager

## 2014-01-16 ENCOUNTER — Encounter: Payer: Self-pay | Admitting: *Deleted

## 2014-01-17 ENCOUNTER — Telehealth: Payer: Self-pay | Admitting: Cardiology

## 2014-01-17 NOTE — Telephone Encounter (Signed)
Cardiac Rehab

## 2014-01-18 ENCOUNTER — Encounter: Payer: Self-pay | Admitting: Physician Assistant

## 2014-01-18 ENCOUNTER — Ambulatory Visit (INDEPENDENT_AMBULATORY_CARE_PROVIDER_SITE_OTHER): Payer: BC Managed Care – PPO | Admitting: Physician Assistant

## 2014-01-18 VITALS — BP 104/64 | HR 55 | Ht 71.0 in | Wt 213.8 lb

## 2014-01-18 DIAGNOSIS — I252 Old myocardial infarction: Secondary | ICD-10-CM

## 2014-01-18 DIAGNOSIS — E119 Type 2 diabetes mellitus without complications: Secondary | ICD-10-CM

## 2014-01-18 DIAGNOSIS — I2510 Atherosclerotic heart disease of native coronary artery without angina pectoris: Secondary | ICD-10-CM

## 2014-01-18 DIAGNOSIS — I10 Essential (primary) hypertension: Secondary | ICD-10-CM

## 2014-01-18 NOTE — Progress Notes (Signed)
[name], [name], [place]  [address] Phone: [phone] Fax:  [phone]  Date:  01/18/2014   ID:  William [name], DOB 11-10-1956, MRN [id number]  PCP:  Elyn Peers, MD  Cardiologist:  Dr. Candee Furbish     History of Present Illness: William [name] is a 58 y.o. male who was recently admitted 2/21-2/24 with a NSTEMI.  LHC demonstrated 2 v CAD.  There was a high grade lesion in the OM1 that was the culprit that was treated with DES.  He also had prox and mid disease in the LAD that was treated with DES x 2.  EF was preserved on Echo without RWMA.  He was newly dx with diabetes (Hgb A1c 9).    LHC (01/07/14):  pLAD 75, mLAD 80, mOM1 99, LVEDP 20.  PCI:  Promus (3 x 12 mm) DES to mLAD; Promus (3 x 16 mm) DES to pLAD; Promus (2.5 x 16 mm) DES to mOM1. Echo (01/08/14):  Mild LVH, EF 55-60%, no RWMA, mod LAE.    The patient denies chest pain, shortness of breath, syncope, orthopnea, PND or significant pedal edema.   Recent Labs: 01/07/2014: ALT 24  01/08/2014: HDL Cholesterol 41; LDL (calc) 49  01/10/2014: Creatinine 0.86; Hemoglobin 12.3*; Potassium 4.8   Wt Readings from Last 3 Encounters:  01/18/14 213 lb 12.8 oz (96.979 kg)  01/10/14 216 lb 11.4 oz (98.3 kg)  01/10/14 216 lb 11.4 oz (98.3 kg)     Past Medical History  Diagnosis Date  . Hypertension   . Diabetes mellitus 2015  . CAD (coronary artery disease)     a. NSTEMI (2/15):  LHC (01/07/14):  pLAD 75, mLAD 80, mOM1 99, LVEDP 20.  PCI:  Promus (3 x 12 mm) DES to mLAD; Promus (3 x 16 mm) DES to pLAD; Promus (2.5 x 16 mm) DES to mOM1.  Marland Kitchen Hx of echocardiogram     a.  Echo (01/08/14):  Mild LVH, EF 55-60%, no RWMA, mod LAE  . H/O non-ST elevation myocardial infarction (NSTEMI) 12/2013    Current Outpatient Prescriptions  Medication Sig Dispense Refill  . Amlodipine-Valsartan-HCTZ 10-160-12.5 MG TABS Take 0.5 tablets by mouth daily. For now, take 1/2 tab daily. Check BP as outpatient and bring to MD.  30 tablet     . aspirin EC 81 MG tablet Take 1 tablet (81 mg total) by mouth daily.      Marland Kitchen atorvastatin (LIPITOR) 40 MG tablet Take 1 tablet (40 mg total) by mouth daily at 6 PM.  30 tablet  11  . ibuprofen (ADVIL,MOTRIN) 200 MG tablet Take 2 tablets (400 mg total) by mouth every 8 (eight) hours as needed for mild pain.  30 tablet  0  . JARDIANCE 10 MG TABS 10 mg daily.      . metoprolol tartrate (LOPRESSOR) 25 MG tablet Take 1 tablet (25 mg total) by mouth 2 (two) times daily.  60 tablet  11  . nitroGLYCERIN (NITROSTAT) 0.4 MG SL tablet Place 1 tablet (0.4 mg total) under the tongue every 5 (five) minutes as needed for chest pain.  25 tablet  3  . Ticagrelor (BRILINTA) 90 MG TABS tablet Take 1 tablet (90 mg total) by mouth 2 (two) times daily.  60 tablet  11   No current facility-administered medications for this visit.    Allergies:   Shellfish allergy   Social History:  The patient  reports that he has never smoked. He does not have any  smokeless tobacco history on file. He reports that he drinks alcohol.   Family History:  The patient's family history is not on file.   ROS:  Please see the history of present illness.      All other systems reviewed and negative.   PHYSICAL EXAM: VS:  BP 104/64  Pulse 55  Ht 5\' 11"  (1.803 m)  Wt 213 lb 12.8 oz (96.979 kg)  BMI 29.83 kg/m2 Well nourished, well developed, in no acute distress HEENT: normal Neck: no JVD Cardiac:  normal S1, S2; RRR; no murmur Lungs:  clear to auscultation bilaterally, no wheezing, rhonchi or rales Abd: soft, nontender, no hepatomegaly Ext: no edemaright wrist without hematoma or mass  Skin: warm and dry Neuro:  CNs 2-12 intact, no focal abnormalities noted  EKG:  Sinus brady, normal axis, LVH, TWI 1, aVL, V6     ASSESSMENT AND PLAN:  1. CAD:  Doing well after recent NSTEMI treated with multivessel PCI with DES.  EF was preserved.  We discussed the importance of dual antiplatelet therapy. He will remain on ASA and  Brilinta. Continue beta blocker and statin.  He will start cardiac rehab soon.  I completed his FMLA paperwork today.  I spent > 20 minutes completing the patient's forms and answering all of his questions.  Face to face time was > 50%. 2. Hypertension:  Controlled.  3. Diabetes Mellitus:  He has recently started Jardiance with his PCP. 4. Hyperlipidemia:  Continue statin.  Check Lipids and LFTs in 6 weeks.   5. Disposition:  F/u with Dr. Candee Furbish in 6 weeks.   Signed, Richardson Dopp, PA-C  01/18/2014 3:54 PM

## 2014-01-18 NOTE — Patient Instructions (Signed)
Your physician recommends that you continue on your current medications as directed. Please refer to the Current Medication list given to you today.  Your physician recommends that you return for lab work in: [place] physician recommends that you schedule a follow-up appointment in: [place]. Marlou Porch

## 2014-02-09 ENCOUNTER — Inpatient Hospital Stay (HOSPITAL_COMMUNITY)
Admission: RE | Admit: 2014-02-09 | Discharge: 2014-02-09 | Disposition: A | Discharge status: Home / Self Care | Payer: BC Managed Care – PPO | Source: Ambulatory Visit

## 2014-02-09 NOTE — Progress Notes (Signed)
Cardiac Rehab Medication Review by a Pharmacist  Does the patient  feel that his/her medications are working for him/her?  yes  Has the patient been experiencing any side effects to the medications prescribed?  no  Does the patient measure his/her own blood pressure or blood glucose at home?  no   Does the patient have any problems obtaining medications due to transportation or finances?   no  Understanding of regimen: poor Understanding of indications: poor Potential of compliance: good  Pharmacist comments: Patient did not seem to have a clear understanding of his medications. He has not been taking aspirin since leaving the hospital. He thought his metoprolol bottle was his aspirin. Also, he asked numerous times if he could drink alcohol, and I counseled him not to drink any.   Roderic Palau A. Pincus Badder, PharmD Clinical Pharmacist - Resident Pager: [phone] Pharmacy: [phone] 02/09/2014 9:51 AM

## 2014-02-13 ENCOUNTER — Encounter (HOSPITAL_COMMUNITY)
Admission: RE | Admit: 2014-02-13 | Discharge: 2014-02-13 | Disposition: A | Discharge status: Home / Self Care | Payer: BC Managed Care – PPO | Source: Ambulatory Visit | Attending: Cardiology | Admitting: Cardiology

## 2014-02-13 DIAGNOSIS — I2510 Atherosclerotic heart disease of native coronary artery without angina pectoris: Secondary | ICD-10-CM | POA: Insufficient documentation

## 2014-02-13 DIAGNOSIS — I10 Essential (primary) hypertension: Secondary | ICD-10-CM | POA: Insufficient documentation

## 2014-02-13 DIAGNOSIS — Z5189 Encounter for other specified aftercare: Secondary | ICD-10-CM | POA: Insufficient documentation

## 2014-02-13 DIAGNOSIS — I252 Old myocardial infarction: Secondary | ICD-10-CM | POA: Insufficient documentation

## 2014-02-13 LAB — GLUCOSE, CAPILLARY
GLUCOSE-CAPILLARY: 166 mg/dL — AB (ref 70–99)
Glucose-Capillary: 91 mg/dL (ref 70–99)
Glucose-Capillary: 134 mg/dL — ABNORMAL HIGH (ref 70–99)

## 2014-02-13 NOTE — Progress Notes (Signed)
Pt started cardiac rehab today.  Pt tolerated light exercise without difficulty. Telemetry rhythm Sinus. Vital signs stable. Pre exercise CBG 91. Patient given a banana and some lemonade repeat CBG 166. Will continue to monitor the patient throughout  the program.

## 2014-02-15 ENCOUNTER — Encounter (HOSPITAL_COMMUNITY)
Admission: RE | Admit: 2014-02-15 | Discharge: 2014-02-15 | Disposition: A | Discharge status: Home / Self Care | Payer: BC Managed Care – PPO | Source: Ambulatory Visit | Attending: Cardiology | Admitting: Cardiology

## 2014-02-15 DIAGNOSIS — I10 Essential (primary) hypertension: Secondary | ICD-10-CM | POA: Insufficient documentation

## 2014-02-15 DIAGNOSIS — Z5189 Encounter for other specified aftercare: Secondary | ICD-10-CM | POA: Insufficient documentation

## 2014-02-15 DIAGNOSIS — I252 Old myocardial infarction: Secondary | ICD-10-CM | POA: Insufficient documentation

## 2014-02-15 DIAGNOSIS — I2510 Atherosclerotic heart disease of native coronary artery without angina pectoris: Secondary | ICD-10-CM | POA: Insufficient documentation

## 2014-02-15 LAB — GLUCOSE, CAPILLARY
GLUCOSE-CAPILLARY: 109 mg/dL — AB (ref 70–99)
Glucose-Capillary: 89 mg/dL (ref 70–99)
Glucose-Capillary: 109 mg/dL — ABNORMAL HIGH (ref 70–99)

## 2014-02-15 NOTE — Progress Notes (Signed)
Nutrition Note Spoke with pt per Barnet Pall, RN's request. Pt does not have a glucometer at home. Pt's PCP notified by Barnet Pall, RN, of pt's need for a glucometer. Per discussion, pt was unaware of the rational for needing to check his CBG's at home. Reasons for SMBG discussed. Pt motivated to work towards tighter glycemic control. Pt ate breakfast "later this morning around 8 am." Pt states he typically eats breakfast around 6 am and then eats yogurt for a mid-morning snack. Pt reports eating a [place] leg, green beans, and an orange for breakfast. Pre-exercise CBG 89 mg/dL, which is too low to exercise. Pt drank lemonade and ate a banana. Post-snack CBG 109 mg/dL. The difference between CBG's too low to exercise and hypoglycemia discussed along with the mechanism of action of pt's anti-diabetic medications. Pt requested diet information to help with meal planning. Pt provided an 1800 kcal, 5-day menu ideas handout. Pt expressed understanding of the information reviewed. Continue client-centered nutrition education by RD as part of interdisciplinary care.  Monitor and evaluate progress toward nutrition goal with team.  Derek Mound, M.Ed, RD, LDN, CDE 02/15/2014 10:34 AM

## 2014-02-17 ENCOUNTER — Encounter (HOSPITAL_COMMUNITY)
Admission: RE | Admit: 2014-02-17 | Discharge: 2014-02-17 | Disposition: A | Discharge status: Home / Self Care | Payer: BC Managed Care – PPO | Source: Ambulatory Visit | Attending: Cardiology | Admitting: Cardiology

## 2014-02-17 LAB — GLUCOSE, CAPILLARY
Glucose-Capillary: 122 mg/dL — ABNORMAL HIGH (ref 70–99)
Glucose-Capillary: 115 mg/dL — ABNORMAL HIGH (ref 70–99)

## 2014-02-20 ENCOUNTER — Encounter (HOSPITAL_COMMUNITY)
Admission: RE | Admit: 2014-02-20 | Discharge: 2014-02-20 | Disposition: A | Discharge status: Home / Self Care | Payer: BC Managed Care – PPO | Source: Ambulatory Visit | Attending: Cardiology | Admitting: Cardiology

## 2014-02-20 LAB — GLUCOSE, CAPILLARY
GLUCOSE-CAPILLARY: 134 mg/dL — AB (ref 70–99)
Glucose-Capillary: 124 mg/dL — ABNORMAL HIGH (ref 70–99)

## 2014-02-20 NOTE — Progress Notes (Signed)
Reviewed home exercise with pt today.  Pt plans to walk and use elliptical and recumbent bike at home for exercise.  Reviewed THR, pulse, RPE, sign and symptoms, NTG use, and when to call 911 or MD.  Pt voiced understanding. Alberteen Sam, MA, ACSM RCEP

## 2014-02-22 ENCOUNTER — Encounter (HOSPITAL_COMMUNITY)
Admission: RE | Admit: 2014-02-22 | Discharge: 2014-02-22 | Disposition: A | Discharge status: Home / Self Care | Payer: BC Managed Care – PPO | Source: Ambulatory Visit | Attending: Cardiology | Admitting: Cardiology

## 2014-02-22 LAB — GLUCOSE, CAPILLARY
GLUCOSE-CAPILLARY: 129 mg/dL — AB (ref 70–99)
Glucose-Capillary: 102 mg/dL — ABNORMAL HIGH (ref 70–99)

## 2014-02-22 NOTE — Progress Notes (Signed)
Dr Fransico Setters office called and notified that William [name] does not have a home glucose meter.  Dr Fransico Setters office says they will order a home glucose meter for the patient.

## 2014-02-24 ENCOUNTER — Encounter (HOSPITAL_COMMUNITY): Payer: BC Managed Care – PPO

## 2014-02-24 NOTE — Progress Notes (Signed)
Nutrition Note Pt called the office. Pt to be absent from Cardiac Rehab for at least 2 weeks "maybe longer" and requested breakfast ideas. Pt sent handouts for 1800 kcal, 5-day menu ideas, Nutrition I class, Nutrition II class, Diabetes Blitz and Diabetes Q & A. Continue client-centered nutrition education by RD as part of interdisciplinary care.  Monitor and evaluate progress toward nutrition goal with team.  Derek Mound, M.Ed, RD, LDN, CDE 02/24/2014 2:21 PM

## 2014-02-27 ENCOUNTER — Encounter (HOSPITAL_COMMUNITY)
Admission: RE | Admit: 2014-02-27 | Discharge: 2014-02-27 | Disposition: A | Discharge status: Home / Self Care | Payer: BC Managed Care – PPO | Source: Ambulatory Visit | Attending: Cardiology | Admitting: Cardiology

## 2014-03-01 ENCOUNTER — Encounter (HOSPITAL_COMMUNITY): Payer: BC Managed Care – PPO

## 2014-03-02 ENCOUNTER — Other Ambulatory Visit (INDEPENDENT_AMBULATORY_CARE_PROVIDER_SITE_OTHER): Payer: BC Managed Care – PPO

## 2014-03-02 DIAGNOSIS — I10 Essential (primary) hypertension: Secondary | ICD-10-CM

## 2014-03-02 LAB — HEPATIC FUNCTION PANEL
ALK PHOS: 33 U/L — AB (ref 39–117)
ALT: 21 U/L (ref 0–53)
AST: 21 U/L (ref 0–37)
Albumin: 3.5 g/dL (ref 3.5–5.2)
BILIRUBIN DIRECT: 0 mg/dL (ref 0.0–0.3)
BILIRUBIN TOTAL: 0.9 mg/dL (ref 0.3–1.2)
Total Protein: 7.1 g/dL (ref 6.0–8.3)

## 2014-03-02 LAB — LIPID PANEL
Cholesterol: 95 mg/dL (ref 0–200)
HDL: 39.1 mg/dL (ref >39.00)
LDL Cholesterol: 47 mg/dL (ref 0–99)
Total CHOL/HDL Ratio: 2
Triglycerides: 46 mg/dL (ref 0.0–149.0)
VLDL: 9.2 mg/dL (ref 0.0–40.0)

## 2014-03-03 ENCOUNTER — Encounter (HOSPITAL_COMMUNITY): Payer: BC Managed Care – PPO

## 2014-03-06 ENCOUNTER — Encounter (HOSPITAL_COMMUNITY): Payer: BC Managed Care – PPO

## 2014-03-07 ENCOUNTER — Ambulatory Visit: Payer: BC Managed Care – PPO | Admitting: Cardiology

## 2014-03-08 ENCOUNTER — Encounter (HOSPITAL_COMMUNITY): Payer: BC Managed Care – PPO

## 2014-03-10 ENCOUNTER — Encounter (HOSPITAL_COMMUNITY): Payer: BC Managed Care – PPO

## 2014-03-13 ENCOUNTER — Ambulatory Visit: Payer: BC Managed Care – PPO | Admitting: *Deleted

## 2014-03-13 ENCOUNTER — Encounter (HOSPITAL_COMMUNITY): Payer: BC Managed Care – PPO

## 2014-03-15 ENCOUNTER — Encounter (HOSPITAL_COMMUNITY): Payer: BC Managed Care – PPO

## 2014-03-17 ENCOUNTER — Encounter (HOSPITAL_COMMUNITY): Payer: BC Managed Care – PPO

## 2014-03-20 ENCOUNTER — Encounter (HOSPITAL_COMMUNITY)
Admission: RE | Admit: 2014-03-20 | Discharge: 2014-03-20 | Disposition: A | Discharge status: Home / Self Care | Payer: BC Managed Care – PPO | Source: Ambulatory Visit | Attending: Cardiology | Admitting: Cardiology

## 2014-03-20 DIAGNOSIS — I252 Old myocardial infarction: Secondary | ICD-10-CM | POA: Insufficient documentation

## 2014-03-20 DIAGNOSIS — I2510 Atherosclerotic heart disease of native coronary artery without angina pectoris: Secondary | ICD-10-CM | POA: Insufficient documentation

## 2014-03-20 DIAGNOSIS — I10 Essential (primary) hypertension: Secondary | ICD-10-CM | POA: Insufficient documentation

## 2014-03-20 DIAGNOSIS — Z5189 Encounter for other specified aftercare: Secondary | ICD-10-CM | POA: Insufficient documentation

## 2014-03-20 LAB — GLUCOSE, CAPILLARY: Glucose-Capillary: 156 mg/dL — ABNORMAL HIGH (ref 70–99)

## 2014-03-22 ENCOUNTER — Encounter (HOSPITAL_COMMUNITY): Payer: BC Managed Care – PPO

## 2014-03-24 ENCOUNTER — Encounter (HOSPITAL_COMMUNITY): Payer: BC Managed Care – PPO

## 2014-03-27 ENCOUNTER — Encounter (HOSPITAL_COMMUNITY)
Admission: RE | Admit: 2014-03-27 | Discharge: 2014-03-27 | Disposition: A | Discharge status: Home / Self Care | Payer: BC Managed Care – PPO | Source: Ambulatory Visit | Attending: Cardiology | Admitting: Cardiology

## 2014-03-27 NOTE — Progress Notes (Signed)
Diabetes Education Spoke with pt. Pt brought his OneTouch Ultra2 meter in for this writer to help pt check his CBG's. Pt educated re: how to prick his finger, glucometer use, and reasoning behind checking CBG's. CBG goals discussed. Per discussion with pt, pt struggling with the fact that he has diabetes. Pt difficulty coping with DM discussed. Pt encouraged to bring his glucometer to exercise and practice checking CBG's with staff for reinforcement/encouragement. Pt demonstrated the ability to check his CBG and use his glucometer. Continue client-centered nutrition education by RD as part of interdisciplinary care.  Monitor and evaluate progress toward nutrition goal with team.  Derek Mound, M.Ed, RD, LDN, CDE 03/27/2014 11:49 AM Lab Results  Component Value Date   HGBA1C 8.9* 01/09/2014

## 2014-03-29 ENCOUNTER — Encounter (HOSPITAL_COMMUNITY): Payer: BC Managed Care – PPO

## 2014-03-29 LAB — GLUCOSE, CAPILLARY: Glucose-Capillary: 129 mg/dL — ABNORMAL HIGH (ref 70–99)

## 2014-03-30 ENCOUNTER — Encounter: Payer: Self-pay | Admitting: Cardiology

## 2014-03-30 ENCOUNTER — Ambulatory Visit (INDEPENDENT_AMBULATORY_CARE_PROVIDER_SITE_OTHER): Payer: BC Managed Care – PPO | Admitting: Cardiology

## 2014-03-30 VITALS — BP 160/71 | HR 55 | Ht 69.0 in | Wt 215.0 lb

## 2014-03-30 DIAGNOSIS — I252 Old myocardial infarction: Secondary | ICD-10-CM

## 2014-03-30 DIAGNOSIS — I10 Essential (primary) hypertension: Secondary | ICD-10-CM

## 2014-03-30 DIAGNOSIS — I2510 Atherosclerotic heart disease of native coronary artery without angina pectoris: Secondary | ICD-10-CM

## 2014-03-30 DIAGNOSIS — E119 Type 2 diabetes mellitus without complications: Secondary | ICD-10-CM

## 2014-03-30 NOTE — Patient Instructions (Signed)
Your physician recommends that you continue on your current medications as directed. Please refer to the Current Medication list given to you today.  Your physician wants you to follow-up in: 6 months with Dr. Skains. You will receive a reminder letter in the mail two months in advance. If you don't receive a letter, please call our office to schedule the follow-up appointment.  

## 2014-03-30 NOTE — Progress Notes (Signed)
[facility]. [address] [place],   [address] Phone: [phone] Fax:  [phone]  Date:  03/30/2014   ID:  William [name], DOB 1956/02/02, MRN [id number]  PCP:  Elyn Peers, MD   History of Present Illness: William [name] is a 58 y.o. male with coronary artery disease status post non-ST elevation myocardial infarction on 01/07/14 with high-grade lesion in obtuse marginal 1 that was felt to be culprit and treated with drug-eluting stents. He also had proximal and mid disease in the LAD that was treated with drug-eluting stent x2. Ejection fraction was preserved without wall motion abnormality.  He has been doing very well, no chest pain, no shortness of breath. He has participated in cardiac rehabilitation and is also a member this and is interested in resuming exercise there. He asked about daily red wine, amusement park ride.   Wt Readings from Last 3 Encounters:  03/30/14 215 lb (97.523 kg)  02/09/14 214 lb 1.1 oz (97.1 kg)  01/18/14 213 lb 12.8 oz (96.979 kg)     Past Medical History  Diagnosis Date  . Hypertension   . Diabetes mellitus 2015  . CAD (coronary artery disease)     a. NSTEMI (2/15):  LHC (01/07/14):  pLAD 75, mLAD 80, mOM1 99, LVEDP 20.  PCI:  Promus (3 x 12 mm) DES to mLAD; Promus (3 x 16 mm) DES to pLAD; Promus (2.5 x 16 mm) DES to mOM1.  Marland Kitchen Hx of echocardiogram     a.  Echo (01/08/14):  Mild LVH, EF 55-60%, no RWMA, mod LAE  . H/O non-ST elevation myocardial infarction (NSTEMI) 12/2013    Past Surgical History  Procedure Laterality Date  . Coronary angioplasty with stent placement  Feb 2015    proxLAD - 3.0x16DES, midLAD -3.0x12 DES, CFX - 2.5x16 DES    Current Outpatient Prescriptions  Medication Sig Dispense Refill  . Amlodipine-Valsartan-HCTZ 10-160-12.5 MG TABS Take 1 tablet by mouth daily.      Marland Kitchen aspirin EC 81 MG tablet Take 1 tablet (81 mg total) by mouth daily.      Marland Kitchen atorvastatin (LIPITOR) 40 MG tablet Take 1 tablet (40 mg  total) by mouth daily at 6 PM.  30 tablet  11  . ibuprofen (ADVIL,MOTRIN) 200 MG tablet Take 2 tablets (400 mg total) by mouth every 8 (eight) hours as needed for mild pain.  30 tablet  0  . JARDIANCE 10 MG TABS Take 10 mg by mouth daily.       . Linagliptin-Metformin HCl (JENTADUETO) 2.5-500 MG TABS Take 1 tablet by mouth 2 (two) times daily.      . metoprolol tartrate (LOPRESSOR) 25 MG tablet Take 1 tablet (25 mg total) by mouth 2 (two) times daily.  60 tablet  11  . nitroGLYCERIN (NITROSTAT) 0.4 MG SL tablet Place 1 tablet (0.4 mg total) under the tongue every 5 (five) minutes as needed for chest pain.  25 tablet  3  . spironolactone (ALDACTONE) 25 MG tablet Take 25 mg by mouth daily.      . Ticagrelor (BRILINTA) 90 MG TABS tablet Take 1 tablet (90 mg total) by mouth 2 (two) times daily.  60 tablet  11   No current facility-administered medications for this visit.    Allergies:    Allergies  Allergen Reactions  . Shellfish Allergy Hives    Social History:  The patient  reports that he has never smoked. He does not have any smokeless tobacco  history on file. He reports that he drinks alcohol. [employer].   No family history on file.No early history of CAD to his knowledge.   ROS:  Please see the history of present illness.    Denies any fevers, chills, orthopnea, PND. other systems reviewed and negative.   PHYSICAL EXAM: VS:  BP 160/71  Pulse 55  Ht 5\' 9"  (1.753 m)  Wt 215 lb (97.523 kg)  BMI 31.74 kg/m2 Well nourished, well developed, in no acute distress HEENT: normal, [place]/AT, EOMI Neck: no JVD, normal carotid upstroke, no bruit Cardiac:  normal S1, S2; RRR; no murmur Lungs:  clear to auscultation bilaterally, no wheezing, rhonchi or rales Abd: soft, nontender, no hepatomegaly, no bruits Ext: no edema, 2+ distal pulses Skin: warm and dry GU: deferred Neuro: no focal abnormalities noted, AAO x 3  EKG:  Sinus bradycardia, LVH, T-wave inversion 1, L., V6    LHC (01/07/14): pLAD 75, mLAD 80, mOM1 99, LVEDP 20. PCI: Promus (3 x 12 mm) DES to mLAD; Promus (3 x 16 mm) DES to pLAD; Promus (2.5 x 16 mm) DES to mOM1.   Echo (01/08/14): Mild LVH, EF 55-60%, no RWMA, mod LAE.   LABS:   01/07/2014: ALT 24  01/08/2014: HDL Cholesterol 41; LDL (calc) 47  01/10/2014: Creatinine 0.86; Hemoglobin 12.3*; Potassium 4.8  ASSESSMENT AND PLAN:  1. Non-ST elevation myocardial infarction-CAD treated DES, normal EF, dual antiplatelet therapy (Brilinta cost is not an issue), cardiac rehabilitation 2. Hypertension-currently well controlled.  3. Diabetes-recently diagnosed. Working with Dr. Criss Rosales. 4. Hyperlipidemia-statin. LDL 47. 5. I'm comfortable with him returning to full work duty at [employer]. Work note given. 6. 6 mth f/u.  Signed, Candee Furbish, MD [facility]  03/30/2014 11:35 AM

## 2014-03-31 ENCOUNTER — Encounter (HOSPITAL_COMMUNITY): Payer: BC Managed Care – PPO

## 2014-03-31 ENCOUNTER — Encounter: Payer: Self-pay | Admitting: Cardiology

## 2014-04-03 ENCOUNTER — Encounter (HOSPITAL_COMMUNITY): Payer: BC Managed Care – PPO

## 2014-04-04 ENCOUNTER — Telehealth (HOSPITAL_COMMUNITY): Payer: Self-pay | Admitting: *Deleted

## 2014-04-04 NOTE — Telephone Encounter (Signed)
Pt is dropping from the program due to work conflicts.  Pt plans to exercise on his own at [employer].  He is going to purchase a heart monitor to make sure that he stays within his THR.  He plans to use the elliptical and a weight machine circuit for exercise. Pt was encouraged to start weights at a very low resistance setting and not to hold his breath while lifting.  Pt voiced understanding.  Thanks for the referral.

## 2014-04-05 ENCOUNTER — Encounter (HOSPITAL_COMMUNITY): Payer: BC Managed Care – PPO

## 2014-04-06 ENCOUNTER — Ambulatory Visit: Payer: BC Managed Care – PPO | Admitting: *Deleted

## 2014-04-07 ENCOUNTER — Encounter (HOSPITAL_COMMUNITY): Payer: BC Managed Care – PPO

## 2014-04-10 ENCOUNTER — Encounter (HOSPITAL_COMMUNITY): Payer: BC Managed Care – PPO

## 2014-04-12 ENCOUNTER — Encounter (HOSPITAL_COMMUNITY): Payer: BC Managed Care – PPO

## 2014-04-14 ENCOUNTER — Encounter (HOSPITAL_COMMUNITY): Payer: BC Managed Care – PPO

## 2014-04-17 ENCOUNTER — Encounter (HOSPITAL_COMMUNITY): Payer: BC Managed Care – PPO

## 2014-04-19 ENCOUNTER — Encounter (HOSPITAL_COMMUNITY): Payer: BC Managed Care – PPO

## 2014-04-21 ENCOUNTER — Encounter (HOSPITAL_COMMUNITY): Payer: BC Managed Care – PPO

## 2014-04-24 ENCOUNTER — Encounter (HOSPITAL_COMMUNITY): Payer: BC Managed Care – PPO

## 2014-04-26 ENCOUNTER — Encounter (HOSPITAL_COMMUNITY): Payer: BC Managed Care – PPO

## 2014-04-28 ENCOUNTER — Encounter (HOSPITAL_COMMUNITY): Payer: BC Managed Care – PPO

## 2014-05-01 ENCOUNTER — Encounter (HOSPITAL_COMMUNITY): Payer: BC Managed Care – PPO

## 2014-05-03 ENCOUNTER — Encounter (HOSPITAL_COMMUNITY): Payer: BC Managed Care – PPO

## 2014-05-05 ENCOUNTER — Encounter (HOSPITAL_COMMUNITY): Payer: BC Managed Care – PPO

## 2014-05-08 ENCOUNTER — Encounter (HOSPITAL_COMMUNITY): Payer: BC Managed Care – PPO

## 2014-05-10 ENCOUNTER — Encounter (HOSPITAL_COMMUNITY): Payer: BC Managed Care – PPO

## 2014-05-12 ENCOUNTER — Encounter (HOSPITAL_COMMUNITY): Payer: BC Managed Care – PPO

## 2014-05-15 ENCOUNTER — Encounter (HOSPITAL_COMMUNITY): Payer: BC Managed Care – PPO

## 2014-05-17 ENCOUNTER — Encounter (HOSPITAL_COMMUNITY): Payer: BC Managed Care – PPO

## 2014-05-19 ENCOUNTER — Encounter (HOSPITAL_COMMUNITY): Payer: BC Managed Care – PPO

## 2014-05-25 ENCOUNTER — Ambulatory Visit: Payer: BC Managed Care – PPO | Admitting: *Deleted

## 2014-10-26 ENCOUNTER — Encounter (HOSPITAL_COMMUNITY): Payer: Self-pay | Admitting: Interventional Cardiology

## 2015-01-12 ENCOUNTER — Other Ambulatory Visit: Payer: Self-pay | Admitting: Physician Assistant

## 2015-01-21 ENCOUNTER — Other Ambulatory Visit: Payer: Self-pay | Admitting: Physician Assistant

## 2015-03-03 ENCOUNTER — Other Ambulatory Visit: Payer: Self-pay | Admitting: Cardiology

## 2015-04-07 ENCOUNTER — Other Ambulatory Visit: Payer: Self-pay | Admitting: Cardiology

## 2015-07-07 ENCOUNTER — Other Ambulatory Visit: Payer: Self-pay | Admitting: Cardiology

## 2015-07-22 ENCOUNTER — Other Ambulatory Visit: Payer: Self-pay | Admitting: Cardiology

## 2015-11-11 IMAGING — CR DG CHEST 1V PORT
1 series · 1 of 1 positions shown · non-contrast
Comparison: None.

CLINICAL DATA: Chest discomfort and shortness of breath.

EXAM:
PORTABLE CHEST - 1 VIEW

[AP]
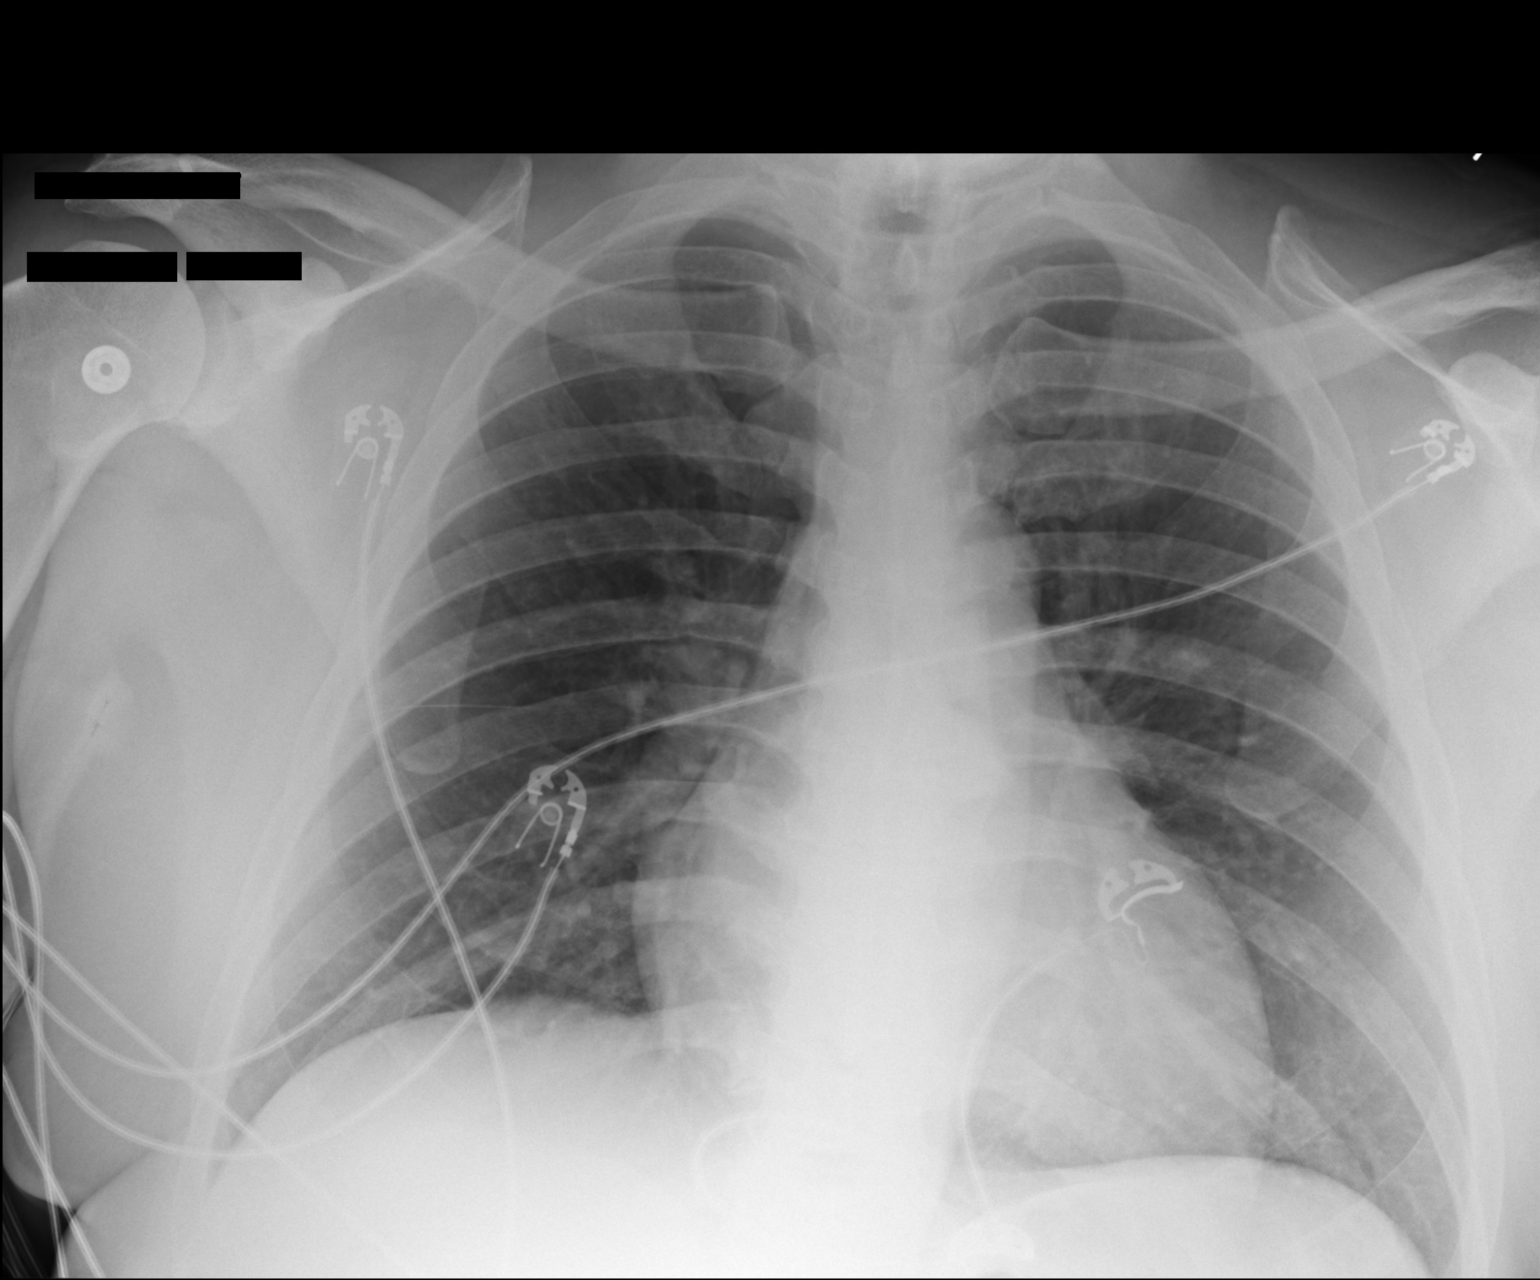

[1 of 1 positions shown; findings below may reference images not displayed]

FINDINGS: The lungs are well-aerated and clear. There is no evidence of focal
opacification, pleural effusion or pneumothorax.

The cardiomediastinal silhouette is within normal limits. No acute
osseous abnormalities are seen.
IMPRESSION: No acute cardiopulmonary process seen.

## 2016-02-06 ENCOUNTER — Other Ambulatory Visit: Payer: Self-pay | Admitting: *Deleted

## 2016-02-06 MED ORDER — METOPROLOL TARTRATE 25 MG PO TABS: 25.0000 mg | ORAL_TABLET | Freq: Two times a day (BID) | ORAL | Status: DC

## 2016-02-06 MED ORDER — ATORVASTATIN CALCIUM 40 MG PO TABS: ORAL_TABLET | ORAL | Status: DC

## 2016-02-06 NOTE — Addendum Note (Signed)
Addended by: Roberts Gaudy on: 02/06/2016 03:20 PM   Modules accepted: Orders

## 2016-06-04 ENCOUNTER — Other Ambulatory Visit: Payer: Self-pay | Admitting: Cardiology

## 2016-06-04 MED ORDER — METOPROLOL TARTRATE 25 MG PO TABS: 25.0000 mg | ORAL_TABLET | Freq: Two times a day (BID) | ORAL | Status: DC

## 2016-06-04 MED ORDER — ATORVASTATIN CALCIUM 40 MG PO TABS: ORAL_TABLET | ORAL | Status: DC

## 2018-01-21 DIAGNOSIS — I10 Essential (primary) hypertension: Secondary | ICD-10-CM | POA: Diagnosis not present

## 2018-01-21 DIAGNOSIS — E782 Mixed hyperlipidemia: Secondary | ICD-10-CM | POA: Diagnosis not present

## 2018-01-21 DIAGNOSIS — R632 Polyphagia: Secondary | ICD-10-CM | POA: Diagnosis not present

## 2018-01-21 DIAGNOSIS — I214 Non-ST elevation (NSTEMI) myocardial infarction: Secondary | ICD-10-CM | POA: Diagnosis not present

## 2018-01-28 ENCOUNTER — Telehealth: Payer: Self-pay | Admitting: Cardiology

## 2018-01-28 DIAGNOSIS — D509 Iron deficiency anemia, unspecified: Secondary | ICD-10-CM | POA: Diagnosis not present

## 2018-01-28 NOTE — Telephone Encounter (Signed)
° °  [facility] Pre-operative Risk Assessment    Request for surgical clearance:  1. What type of surgery is being performed? Colonoscopy/endoscopy   2. When is this surgery scheduled? 02/23/2018  3. What type of clearance is required (medical clearance vs. Pharmacy clearance to hold med vs. Both)? Both  4. Are there any medications that need to be held prior to surgery and how long? Brilinta   5. Practice name and name of physician performing surgery? [facility], Dr. Michail Sermon  6. What is your office phone and fax number? Ph# [phone], Fax: [phone]  7. Anesthesia type (None, local, MAC, general) ? Not listed.   William [name] 01/28/2018, 2:10 PM  _________________________________________________________________   (provider comments below)

## 2018-02-01 NOTE — Telephone Encounter (Signed)
   Primary Cardiologist: No primary care provider on file.  Chart reviewed as part of pre-operative protocol coverage. Given past medical history and time since last visit, based on ACC/AHA guidelines, William [name] would be at acceptable risk for the planned procedure without further cardiovascular testing. He may stop Brilinta for 5 days prior to colonoscopy and EDG. Restart as soon as possible after procedure.   I will route this recommendation to the requesting party via Epic fax function and remove from pre-op pool.  Please call with questions.  Phill Myron. West Pugh, ANP, AACC  02/01/2018, 5:01 PM

## 2018-02-02 NOTE — Telephone Encounter (Signed)
Faxed via Epic

## 2018-02-23 DIAGNOSIS — K2980 Duodenitis without bleeding: Secondary | ICD-10-CM | POA: Diagnosis not present

## 2018-02-23 DIAGNOSIS — K2930 Chronic superficial gastritis without bleeding: Secondary | ICD-10-CM | POA: Diagnosis not present

## 2018-02-23 DIAGNOSIS — D509 Iron deficiency anemia, unspecified: Secondary | ICD-10-CM | POA: Diagnosis not present

## 2018-02-23 DIAGNOSIS — D126 Benign neoplasm of colon, unspecified: Secondary | ICD-10-CM | POA: Diagnosis not present

## 2018-02-23 DIAGNOSIS — K2900 Acute gastritis without bleeding: Secondary | ICD-10-CM | POA: Diagnosis not present

## 2018-04-06 DIAGNOSIS — I10 Essential (primary) hypertension: Secondary | ICD-10-CM | POA: Diagnosis not present

## 2018-04-06 DIAGNOSIS — E1100 Type 2 diabetes mellitus with hyperosmolarity without nonketotic hyperglycemic-hyperosmolar coma (NKHHC): Secondary | ICD-10-CM | POA: Diagnosis not present

## 2018-04-06 DIAGNOSIS — E782 Mixed hyperlipidemia: Secondary | ICD-10-CM | POA: Diagnosis not present

## 2018-06-10 DIAGNOSIS — E1100 Type 2 diabetes mellitus with hyperosmolarity without nonketotic hyperglycemic-hyperosmolar coma (NKHHC): Secondary | ICD-10-CM | POA: Diagnosis not present

## 2018-06-10 DIAGNOSIS — I119 Hypertensive heart disease without heart failure: Secondary | ICD-10-CM | POA: Diagnosis not present

## 2018-07-15 DIAGNOSIS — E1100 Type 2 diabetes mellitus with hyperosmolarity without nonketotic hyperglycemic-hyperosmolar coma (NKHHC): Secondary | ICD-10-CM | POA: Diagnosis not present

## 2018-07-15 DIAGNOSIS — I10 Essential (primary) hypertension: Secondary | ICD-10-CM | POA: Diagnosis not present

## 2018-07-15 DIAGNOSIS — E782 Mixed hyperlipidemia: Secondary | ICD-10-CM | POA: Diagnosis not present

## 2018-08-25 DIAGNOSIS — I10 Essential (primary) hypertension: Secondary | ICD-10-CM | POA: Diagnosis not present

## 2018-08-25 DIAGNOSIS — E782 Mixed hyperlipidemia: Secondary | ICD-10-CM | POA: Diagnosis not present

## 2018-08-25 DIAGNOSIS — E1100 Type 2 diabetes mellitus with hyperosmolarity without nonketotic hyperglycemic-hyperosmolar coma (NKHHC): Secondary | ICD-10-CM | POA: Diagnosis not present

## 2018-09-14 DIAGNOSIS — I119 Hypertensive heart disease without heart failure: Secondary | ICD-10-CM | POA: Diagnosis not present

## 2018-09-14 DIAGNOSIS — E7800 Pure hypercholesterolemia, unspecified: Secondary | ICD-10-CM | POA: Diagnosis not present

## 2018-09-14 DIAGNOSIS — E1100 Type 2 diabetes mellitus with hyperosmolarity without nonketotic hyperglycemic-hyperosmolar coma (NKHHC): Secondary | ICD-10-CM | POA: Diagnosis not present

## 2019-01-13 DIAGNOSIS — I119 Hypertensive heart disease without heart failure: Secondary | ICD-10-CM | POA: Diagnosis not present

## 2019-01-13 DIAGNOSIS — E1100 Type 2 diabetes mellitus with hyperosmolarity without nonketotic hyperglycemic-hyperosmolar coma (NKHHC): Secondary | ICD-10-CM | POA: Diagnosis not present

## 2019-01-13 DIAGNOSIS — I10 Essential (primary) hypertension: Secondary | ICD-10-CM | POA: Diagnosis not present

## 2019-10-02 ENCOUNTER — Other Ambulatory Visit: Payer: Self-pay

## 2019-10-02 ENCOUNTER — Emergency Department (HOSPITAL_COMMUNITY): Payer: Commercial Managed Care - PPO

## 2019-10-02 ENCOUNTER — Inpatient Hospital Stay (HOSPITAL_COMMUNITY)
Admission: EM | Admit: 2019-10-02 | Discharge: 2019-10-18 | DRG: 207 | Disposition: E | Payer: Commercial Managed Care - PPO | Attending: Internal Medicine | Admitting: Internal Medicine

## 2019-10-02 DIAGNOSIS — J9382 Other air leak: Secondary | ICD-10-CM | POA: Diagnosis not present

## 2019-10-02 DIAGNOSIS — I2510 Atherosclerotic heart disease of native coronary artery without angina pectoris: Secondary | ICD-10-CM | POA: Diagnosis present

## 2019-10-02 DIAGNOSIS — Z515 Encounter for palliative care: Secondary | ICD-10-CM | POA: Diagnosis not present

## 2019-10-02 DIAGNOSIS — E875 Hyperkalemia: Secondary | ICD-10-CM | POA: Diagnosis not present

## 2019-10-02 DIAGNOSIS — J9601 Acute respiratory failure with hypoxia: Secondary | ICD-10-CM | POA: Diagnosis not present

## 2019-10-02 DIAGNOSIS — I252 Old myocardial infarction: Secondary | ICD-10-CM

## 2019-10-02 DIAGNOSIS — I130 Hypertensive heart and chronic kidney disease with heart failure and stage 1 through stage 4 chronic kidney disease, or unspecified chronic kidney disease: Secondary | ICD-10-CM | POA: Diagnosis present

## 2019-10-02 DIAGNOSIS — Z7902 Long term (current) use of antithrombotics/antiplatelets: Secondary | ICD-10-CM

## 2019-10-02 DIAGNOSIS — N179 Acute kidney failure, unspecified: Secondary | ICD-10-CM | POA: Diagnosis not present

## 2019-10-02 DIAGNOSIS — E785 Hyperlipidemia, unspecified: Secondary | ICD-10-CM | POA: Diagnosis present

## 2019-10-02 DIAGNOSIS — I25119 Atherosclerotic heart disease of native coronary artery with unspecified angina pectoris: Secondary | ICD-10-CM | POA: Diagnosis not present

## 2019-10-02 DIAGNOSIS — J1289 Other viral pneumonia: Secondary | ICD-10-CM | POA: Diagnosis not present

## 2019-10-02 DIAGNOSIS — E119 Type 2 diabetes mellitus without complications: Secondary | ICD-10-CM

## 2019-10-02 DIAGNOSIS — Z7982 Long term (current) use of aspirin: Secondary | ICD-10-CM

## 2019-10-02 DIAGNOSIS — Z91013 Allergy to seafood: Secondary | ICD-10-CM | POA: Diagnosis not present

## 2019-10-02 DIAGNOSIS — R040 Epistaxis: Secondary | ICD-10-CM | POA: Diagnosis not present

## 2019-10-02 DIAGNOSIS — L8990 Pressure ulcer of unspecified site, unspecified stage: Secondary | ICD-10-CM | POA: Diagnosis not present

## 2019-10-02 DIAGNOSIS — R0902 Hypoxemia: Secondary | ICD-10-CM

## 2019-10-02 DIAGNOSIS — Z86718 Personal history of other venous thrombosis and embolism: Secondary | ICD-10-CM

## 2019-10-02 DIAGNOSIS — Z66 Do not resuscitate: Secondary | ICD-10-CM | POA: Diagnosis not present

## 2019-10-02 DIAGNOSIS — I5032 Chronic diastolic (congestive) heart failure: Secondary | ICD-10-CM | POA: Diagnosis not present

## 2019-10-02 DIAGNOSIS — N171 Acute kidney failure with acute cortical necrosis: Secondary | ICD-10-CM | POA: Diagnosis not present

## 2019-10-02 DIAGNOSIS — D573 Sickle-cell trait: Secondary | ICD-10-CM | POA: Diagnosis present

## 2019-10-02 DIAGNOSIS — N1831 Chronic kidney disease, stage 3a: Secondary | ICD-10-CM | POA: Diagnosis not present

## 2019-10-02 DIAGNOSIS — I301 Infective pericarditis: Secondary | ICD-10-CM | POA: Diagnosis not present

## 2019-10-02 DIAGNOSIS — G9341 Metabolic encephalopathy: Secondary | ICD-10-CM | POA: Diagnosis not present

## 2019-10-02 DIAGNOSIS — E43 Unspecified severe protein-calorie malnutrition: Secondary | ICD-10-CM | POA: Diagnosis present

## 2019-10-02 DIAGNOSIS — M7989 Other specified soft tissue disorders: Secondary | ICD-10-CM | POA: Diagnosis not present

## 2019-10-02 DIAGNOSIS — Z6835 Body mass index (BMI) 35.0-35.9, adult: Secondary | ICD-10-CM | POA: Diagnosis not present

## 2019-10-02 DIAGNOSIS — J1282 Pneumonia due to coronavirus disease 2019: Secondary | ICD-10-CM | POA: Diagnosis present

## 2019-10-02 DIAGNOSIS — Z955 Presence of coronary angioplasty implant and graft: Secondary | ICD-10-CM | POA: Diagnosis not present

## 2019-10-02 DIAGNOSIS — E118 Type 2 diabetes mellitus with unspecified complications: Secondary | ICD-10-CM | POA: Diagnosis not present

## 2019-10-02 DIAGNOSIS — U071 COVID-19: Principal | ICD-10-CM | POA: Diagnosis present

## 2019-10-02 DIAGNOSIS — I10 Essential (primary) hypertension: Secondary | ICD-10-CM | POA: Diagnosis not present

## 2019-10-02 DIAGNOSIS — Z7984 Long term (current) use of oral hypoglycemic drugs: Secondary | ICD-10-CM

## 2019-10-02 DIAGNOSIS — R7989 Other specified abnormal findings of blood chemistry: Secondary | ICD-10-CM | POA: Diagnosis not present

## 2019-10-02 DIAGNOSIS — J189 Pneumonia, unspecified organism: Secondary | ICD-10-CM | POA: Insufficient documentation

## 2019-10-02 DIAGNOSIS — I82461 Acute embolism and thrombosis of right calf muscular vein: Secondary | ICD-10-CM | POA: Diagnosis not present

## 2019-10-02 DIAGNOSIS — E1165 Type 2 diabetes mellitus with hyperglycemia: Secondary | ICD-10-CM | POA: Diagnosis not present

## 2019-10-02 DIAGNOSIS — J80 Acute respiratory distress syndrome: Secondary | ICD-10-CM | POA: Diagnosis not present

## 2019-10-02 DIAGNOSIS — I2583 Coronary atherosclerosis due to lipid rich plaque: Secondary | ICD-10-CM | POA: Diagnosis not present

## 2019-10-02 DIAGNOSIS — I82462 Acute embolism and thrombosis of left calf muscular vein: Secondary | ICD-10-CM | POA: Diagnosis not present

## 2019-10-02 DIAGNOSIS — R0602 Shortness of breath: Secondary | ICD-10-CM | POA: Diagnosis present

## 2019-10-02 LAB — CBC WITH DIFFERENTIAL/PLATELET
Abs Immature Granulocytes: 0.02 10*3/uL (ref 0.00–0.07)
Basophils Absolute: 0 10*3/uL (ref 0.0–0.1)
Basophils Relative: 0 %
Eosinophils Absolute: 0 10*3/uL (ref 0.0–0.5)
Eosinophils Relative: 0 %
HCT: 37.3 % — ABNORMAL LOW (ref 39.0–52.0)
Hemoglobin: 12.1 g/dL — ABNORMAL LOW (ref 13.0–17.0)
Immature Granulocytes: 0 %
Lymphocytes Relative: 9 %
Lymphs Abs: 0.5 10*3/uL — ABNORMAL LOW (ref 0.7–4.0)
MCH: 25.2 pg — ABNORMAL LOW (ref 26.0–34.0)
MCHC: 32.4 g/dL (ref 30.0–36.0)
MCV: 77.7 fL — ABNORMAL LOW (ref 80.0–100.0)
Monocytes Absolute: 0.2 10*3/uL (ref 0.1–1.0)
Monocytes Relative: 4 %
Neutro Abs: 4.5 10*3/uL (ref 1.7–7.7)
Neutrophils Relative %: 87 %
Platelets: 257 10*3/uL (ref 150–400)
RBC: 4.8 MIL/uL (ref 4.22–5.81)
RDW: 14.5 % (ref 11.5–15.5)
WBC: 5.2 10*3/uL (ref 4.0–10.5)
nRBC: 0 % (ref 0.0–0.2)

## 2019-10-02 LAB — COMPREHENSIVE METABOLIC PANEL
ALT: 19 U/L (ref 0–44)
AST: 29 U/L (ref 15–41)
Albumin: 3 g/dL — ABNORMAL LOW (ref 3.5–5.0)
Alkaline Phosphatase: 32 U/L — ABNORMAL LOW (ref 38–126)
Anion gap: 13 (ref 5–15)
BUN: 25 mg/dL — ABNORMAL HIGH (ref 8–23)
CO2: 21 mmol/L — ABNORMAL LOW (ref 22–32)
Calcium: 7.8 mg/dL — ABNORMAL LOW (ref 8.9–10.3)
Chloride: 103 mmol/L (ref 98–111)
Creatinine, Ser: 1.87 mg/dL — ABNORMAL HIGH (ref 0.61–1.24)
GFR calc Af Amer: 44 mL/min — ABNORMAL LOW (ref >60)
GFR calc non Af Amer: 38 mL/min — ABNORMAL LOW (ref >60)
Glucose, Bld: 200 mg/dL — ABNORMAL HIGH (ref 70–99)
Potassium: 4.6 mmol/L (ref 3.5–5.1)
Sodium: 137 mmol/L (ref 135–145)
Total Bilirubin: 0.7 mg/dL (ref 0.3–1.2)
Total Protein: 7.2 g/dL (ref 6.5–8.1)

## 2019-10-02 LAB — URINALYSIS, ROUTINE W REFLEX MICROSCOPIC
Bilirubin Urine: NEGATIVE
Glucose, UA: >=500 mg/dL — AB
Hgb urine dipstick: NEGATIVE
Ketones, ur: NEGATIVE mg/dL
Leukocytes,Ua: NEGATIVE
Nitrite: NEGATIVE
Protein, ur: NEGATIVE mg/dL
Specific Gravity, Urine: 1.015 (ref 1.005–1.030)
pH: 5 (ref 5.0–8.0)

## 2019-10-02 LAB — BRAIN NATRIURETIC PEPTIDE: B Natriuretic Peptide: 26.7 pg/mL (ref 0.0–100.0)

## 2019-10-02 LAB — SARS CORONAVIRUS 2 (TAT 6-24 HRS): SARS Coronavirus 2: POSITIVE — AB

## 2019-10-02 MED ORDER — SODIUM CHLORIDE 0.9 % IV SOLN: 100.0000 mg | INTRAVENOUS | Status: DC

## 2019-10-02 MED ORDER — SODIUM CHLORIDE 0.9 % IV SOLN
500.0000 mg | Freq: Once | INTRAVENOUS | Status: AC
  Administered 2019-10-02: 21:00:00 500 mg via INTRAVENOUS
  Filled 2019-10-02: qty 500

## 2019-10-02 MED ORDER — ACETAMINOPHEN 500 MG PO TABS
1000.0000 mg | ORAL_TABLET | Freq: Once | ORAL | Status: AC
  Administered 2019-10-02: 1000 mg via ORAL
  Filled 2019-10-02: qty 2

## 2019-10-02 MED ORDER — SODIUM CHLORIDE 0.9 % IV SOLN
200.0000 mg | Freq: Once | INTRAVENOUS | Status: AC
  Administered 2019-10-03: 200 mg via INTRAVENOUS
  Filled 2019-10-02: qty 40

## 2019-10-02 MED ORDER — SODIUM CHLORIDE 0.9 % IV SOLN
1.0000 g | Freq: Once | INTRAVENOUS | Status: AC
  Administered 2019-10-02: 1 g via INTRAVENOUS
  Filled 2019-10-02: qty 10

## 2019-10-02 MED ORDER — SODIUM CHLORIDE 0.9 % IV BOLUS
1000.0000 mL | Freq: Once | INTRAVENOUS | Status: AC
  Administered 2019-10-02: 1000 mL via INTRAVENOUS

## 2019-10-02 NOTE — ED Notes (Signed)
Date and time results received: 10/05/2019  (use smartphrase ".now" to insert current time)  Test: Covid + Critical Value: positive

## 2019-10-02 NOTE — ED Provider Notes (Signed)
[facility] EMERGENCY DEPARTMENT Provider Note   CSN: [id number] Arrival date & time: 09/19/2019  1723     History   Chief Complaint Chief Complaint  Patient presents with  . Chest Pain    HPI William [name] is a 63 y.o. male.     HPI Patient presents from home with concern for dyspnea, chest pain. Onset was 3 days ago. Patient states that he was well until about that time, now since that time he has had subjective fever, some chest pressure, worse with inspiration, and dyspnea. No other new pain, including headache, no vomiting, no diarrhea. He arrives via EMS, and their providers provide additional historical details. They note that in route to the patient required substantial oxygen supplementation as he was hypoxic, 80% on room air on their arrival. However, with nonrebreather mask, at 15 L/min, the patient has had saturation of 96%. Patient self states that he is generally healthy, does have history of cardiac disease, diabetes, hypertension. Patient has been going to work throughout the recent.,  But he is unaware of any positive sick contacts. Over the 3 days of illness, no clearly beating or exacerbating symptoms, and today, with concern for dyspnea, chest pain, his wife called EMS, while the patient himself was in the shower.  He presents in his bathrobe. Past Medical History:  Diagnosis Date  . CAD (coronary artery disease)    a. NSTEMI (2/15):  LHC (01/07/14):  pLAD 75, mLAD 80, mOM1 99, LVEDP 20.  PCI:  Promus (3 x 12 mm) DES to mLAD; Promus (3 x 16 mm) DES to pLAD; Promus (2.5 x 16 mm) DES to mOM1.  . Diabetes mellitus 2015  . H/O non-ST elevation myocardial infarction (NSTEMI) 12/2013  . Hx of echocardiogram    a.  Echo (01/08/14):  Mild LVH, EF 55-60%, no RWMA, mod LAE  . Hypertension     Patient Active Problem List   Diagnosis Date Noted  . Old non-ST elevation myocardial infarction (NSTEMI) 01/18/2014  . Coronary atherosclerosis of native  coronary artery 01/18/2014  . Diabetes mellitus ([place])   . HTN (hypertension) 01/07/2014    Past Surgical History:  Procedure Laterality Date  . CORONARY ANGIOPLASTY WITH STENT PLACEMENT  Feb 2015   proxLAD - 3.0x16DES, midLAD -3.0x12 DES, CFX - 2.5x16 DES  . LEFT HEART CATHETERIZATION WITH CORONARY ANGIOGRAM N/A 01/09/2014   Procedure: LEFT HEART CATHETERIZATION WITH CORONARY ANGIOGRAM;  Surgeon: Jettie Booze, MD;  Location: [facility] CATH LAB;  Service: Cardiovascular;  Laterality: N/A;        Home Medications    Prior to Admission medications   Medication Sig Start Date End Date Taking? Authorizing Provider  Amlodipine-Valsartan-HCTZ 10-160-12.5 MG TABS Take 1 tablet by mouth daily.    [provider]  aspirin EC 81 MG tablet Take 1 tablet (81 mg total) by mouth daily. 01/10/14   Barrett, Evelene Croon, PA-C  atorvastatin (LIPITOR) 40 MG tablet take 1 tablet by mouth once daily AT 6PM 06/04/16   Jerline Pain, MD  BRILINTA 90 MG TABS tablet take 1 tablet by mouth twice a day 07/24/15   Jerline Pain, MD  ibuprofen (ADVIL,MOTRIN) 200 MG tablet Take 2 tablets (400 mg total) by mouth every 8 (eight) hours as needed for mild pain. 01/10/14   Barrett, Evelene Croon, PA-C  JARDIANCE 10 MG TABS Take 10 mg by mouth daily.  01/16/14   [provider]  Linagliptin-Metformin HCl (JENTADUETO) 2.5-500 MG TABS Take 1 tablet  by mouth 2 (two) times daily.    [provider]  metoprolol tartrate (LOPRESSOR) 25 MG tablet Take 1 tablet (25 mg total) by mouth 2 (two) times daily. 06/04/16   Jerline Pain, MD  nitroGLYCERIN (NITROSTAT) 0.4 MG SL tablet Place 1 tablet (0.4 mg total) under the tongue every 5 (five) minutes as needed for chest pain. 01/10/14   Barrett, Evelene Croon, PA-C  spironolactone (ALDACTONE) 25 MG tablet take 1 tablet by mouth once daily 04/09/15   Jerline Pain, MD    Family History No family history on file.  Social History Social History   Tobacco Use  . Smoking  status: Never Smoker  Substance Use Topics  . Alcohol use: Yes  . Drug use: Not on file     Allergies   Shellfish allergy   Review of Systems Review of Systems  Constitutional:       Per HPI, otherwise negative  HENT:       Per HPI, otherwise negative  Respiratory:       Per HPI, otherwise negative  Cardiovascular:       Per HPI, otherwise negative  Gastrointestinal: Negative for vomiting.  Endocrine:       Negative aside from HPI  Genitourinary:       Neg aside from HPI   Musculoskeletal:       Per HPI, otherwise negative  Skin: Negative.   Neurological: Negative for syncope.     Physical Exam Updated Vital Signs BP 121/62 (BP Location: Left Arm)   Pulse 73   Temp (!) 101.4 F (38.6 C) (Oral)   Resp (!) 24   Ht 5\' 6"  (1.676 m)   Wt 102.1 kg   SpO2 95%   BMI 36.32 kg/m   Physical Exam Vitals signs and nursing note reviewed.  Constitutional:      General: He is not in acute distress.    Appearance: He is well-developed.  HENT:     Head: Normocephalic and atraumatic.  Eyes:     Conjunctiva/sclera: Conjunctivae normal.  Cardiovascular:     Rate and Rhythm: Regular rhythm.  Pulmonary:     Effort: Pulmonary effort is normal. Tachypnea present. No respiratory distress.     Breath sounds: No stridor.  Abdominal:     General: There is no distension.  Skin:    General: Skin is warm and dry.  Neurological:     Mental Status: He is alert and oriented to person, place, and time.      ED Treatments / Results  Labs (all labs ordered are listed, but only abnormal results are displayed) Labs Reviewed  COMPREHENSIVE METABOLIC PANEL - Abnormal; Notable for the following components:      Result Value   CO2 21 (*)    Glucose, Bld 200 (*)    BUN 25 (*)    Creatinine, Ser 1.87 (*)    Calcium 7.8 (*)    Albumin 3.0 (*)    Alkaline Phosphatase 32 (*)    GFR calc non Af Amer 38 (*)    GFR calc Af Amer 44 (*)    All other components within normal limits   CBC WITH DIFFERENTIAL/PLATELET - Abnormal; Notable for the following components:   Hemoglobin 12.1 (*)    HCT 37.3 (*)    MCV 77.7 (*)    MCH 25.2 (*)    Lymphs Abs 0.5 (*)    All other components within normal limits  URINALYSIS, ROUTINE W REFLEX MICROSCOPIC - Abnormal;  Notable for the following components:   Color, Urine STRAW (*)    Glucose, UA >=500 (*)    Bacteria, UA RARE (*)    All other components within normal limits  SARS CORONAVIRUS 2 (TAT 6-24 HRS)  BRAIN NATRIURETIC PEPTIDE    EKG EKG Interpretation  Date/Time:  Sunday October 02 2019 17:35:36 EST Ventricular Rate:  71 PR Interval:    QRS Duration: 102 QT Interval:  387 QTC Calculation: 421 R Axis:   29 Text Interpretation: Sinus rhythm Prolonged PR interval Baseline wander Artifact Abnormal ekg Confirmed by Carmin Muskrat [phone]) on 10/01/2019 5:45:41 PM   Radiology Dg Chest Port 1 View  Result Date: 09/23/2019 CLINICAL DATA:  Pt complains of chest pain x 3 days, non-radiating, worse with inspiration, cough, fever, and generally feeling unwell. Pt was unable to hold deep inspiration for x-ray due to coughing. EXAM: PORTABLE CHEST 1 VIEW COMPARISON:  01/07/2014 FINDINGS: Hazy interstitial and airspace opacities present in the left perihilar region, right upper lobe, and left lower lobe. Low lung volumes are present, causing crowding of the pulmonary vasculature. No blunting of the costophrenic angles. Cardiac and mediastinal margins appear normal. IMPRESSION: 1. Hazy interstitial and airspace opacities in the left perihilar region, right upper lobe, and left lower lobe, suspicious for multilobar pneumonia or atypical pneumonia. 2. Low lung volumes. Electronically Signed   By: Van Clines M.D.   On: 09/23/2019 18:57    Procedures Procedures (including critical care time)  CRITICAL CARE Performed by: Carmin Muskrat Total critical care time: 40 minutes Critical care time was exclusive of separately  billable procedures and treating other patients. Critical care was necessary to treat or prevent imminent or life-threatening deterioration. Critical care was time spent personally by me on the following activities: development of treatment plan with patient and/or surrogate as well as nursing, discussions with consultants, evaluation of patient's response to treatment, examination of patient, obtaining history from patient or surrogate, ordering and performing treatments and interventions, ordering and review of laboratory studies, ordering and review of radiographic studies, pulse oximetry and re-evaluation of patient's condition.   Medications Ordered in ED Medications  sodium chloride 0.9 % bolus 1,000 mL (has no administration in time range)  cefTRIAXone (ROCEPHIN) 1 g in sodium chloride 0.9 % 100 mL IVPB (has no administration in time range)  azithromycin (ZITHROMAX) 500 mg in sodium chloride 0.9 % 250 mL IVPB (has no administration in time range)  acetaminophen (TYLENOL) tablet 1,000 mg (1,000 mg Oral Given 09/28/2019 1806)     Initial Impression / Assessment and Plan / ED Course  I have reviewed the triage vital signs and the nursing notes.  Pertinent labs & imaging results that were available during my care of the patient were reviewed by me and considered in my medical decision making (see chart for details).    Initial vitals notable for fever of 101.4, tachypnea, and requirement of 15 L via nonrebreather mask for appropriate saturation.  With these concerns, suspicion for infectious etiology, likely, but broad differential, including ACS, CHF considered.     After the initial evaluation the patient's wife came for a brief visit.  She acknowledges calling the EMS, while the patient was in the shower.  She did this due to concern of the patient's worsening condition, including increasing cough, dyspnea.   Initial x-ray concerning for pneumonia.  Initial labs notable for acute  kidney injury.  7:45 PM Patient substantially more comfortable, able to have diminished oxygen supplementation.  This adult  male presents with pain, dyspnea, hypoxia in the field. Here the patient is awake and alert, found to be febrile, tachypneic, requiring oxygen supplementation, though improved substantially after Solu-Medrol, aspirin, fluids.  However, with new oxygen requirement, he does require admission. Initial findings concerning for pneumonia, with a Covid or bacterial.  Given his initial respiratory difficulty, patient started on antibiotics, pending Covid test. Initial EKG inconsistent with ACS.  No evidence for heart failure either.  Terrion Arthur was evaluated in Emergency Department on 10/17/2019 for the symptoms described in the history of present illness. He was evaluated in the context of the global COVID-19 pandemic, which necessitated consideration that the patient might be at risk for infection with the SARS-CoV-2 virus that causes COVID-19. Institutional protocols and algorithms that pertain to the evaluation of patients at risk for COVID-19 are in a state of rapid change based on information released by regulatory bodies including the CDC and federal and state organizations. These policies and algorithms were followed during the patient's care in the ED.   Final Clinical Impressions(s) / ED Diagnoses   Final diagnoses:  Hypoxia  Acute kidney injury [facility])     Carmin Muskrat, MD 10/17/2019 1948

## 2019-10-02 NOTE — Progress Notes (Signed)
Pharmacy Note  63yo male c/o fever, chest pressure w/ inspiration, and dyspnea; O2 sat was 80% on RA when EMS arrived, up to 96% on NRB, CXR concerning for PNA.  Covid test positive >> to begin remdesivir.  Remdesivir 200mg  IV x1 followed by 100mg  IV Q24H x4.   Wynona Neat, PharmD, BCPS 09/29/2019 11:46 PM

## 2019-10-02 NOTE — ED Notes (Signed)
William [name] [id number] patients wife wondering if he will be coming home

## 2019-10-02 NOTE — ED Notes (Signed)
[phone] (Sibyl, wife - wishes to be kept updated)  [phone] Everlene Farrier, Daughter)

## 2019-10-02 NOTE — H&P (Signed)
History and Physical   William [name] [id number] DOB: [birth year] DOA: 10/11/2019  Referring MD/NP/PA: Dr Vanita Panda  PCP: [name], MD   Outpatient Specialists: None  Patient coming from: Home  Chief Complaint: Shortness of breath and cough  HPI: William [name] is a 63 y.o. male with medical history significant of diabetes, hypertension, hyperlipidemia, coronary artery disease who presented from home with EMS complaining of 3 days of nonradiating chest pain sounded pleuritic with shortness of breath and cough.  His oxygen sat was 80% on room air when EMS arrived.  They placed patient on 15 L nonrebreather bag and his sats went to 96%.  Patient denied any known sick contacts.  He was slightly tachypneic and responded to initial steroids now breathing better.  He has no prior history of lung disease including COPD.  Chest x-ray showed bilateral pneumonia consistent with COVID-19 pneumonia and subsequent COVID-19 testing was positive.  Patient is therefore being admitted with pneumonia as a result of COVID-19 with oxygen demand now..  ED Course: Temperature 101.4 blood pressure 120/62 pulse 73 respiratory rate of 34 and oxygen sat 93% on 2 L.  Sodium 137 potassium 4.6 chloride 103 CO2 21 with glucose 200 BUN 25 creatinine 1.87 albumin 3.0.  BNP of 26 CBC appears to be within normal.  Urinalysis so far negative.  COVID-19 screen is positive.  Chest x-ray showed hazy interstitial and airspace opacities in the left perihilar region right upper lobe and left lower lobe suspicious for multilobar pneumonia which is atypical.  Patient is being admitted to the hospital with diagnosis of COVID-19 pneumonia with oxygen requirement.  Review of Systems: As per HPI otherwise 10 point review of systems negative.    Past Medical History:  Diagnosis Date  . CAD (coronary artery disease)    a. NSTEMI (2/15):  LHC (01/07/14):  pLAD 75, mLAD 80, mOM1 99, LVEDP 20.  PCI:  Promus (3 x 12 mm) DES to mLAD;  Promus (3 x 16 mm) DES to pLAD; Promus (2.5 x 16 mm) DES to mOM1.  . Diabetes mellitus 2015  . H/O non-ST elevation myocardial infarction (NSTEMI) 12/2013  . Hx of echocardiogram    a.  Echo (01/08/14):  Mild LVH, EF 55-60%, no RWMA, mod LAE  . Hypertension     Past Surgical History:  Procedure Laterality Date  . CORONARY ANGIOPLASTY WITH STENT PLACEMENT  Feb 2015   proxLAD - 3.0x16DES, midLAD -3.0x12 DES, CFX - 2.5x16 DES  . LEFT HEART CATHETERIZATION WITH CORONARY ANGIOGRAM N/A 01/09/2014   Procedure: LEFT HEART CATHETERIZATION WITH CORONARY ANGIOGRAM;  Surgeon: Jettie Booze, MD;  Location: [facility] CATH LAB;  Service: Cardiovascular;  Laterality: N/A;     reports that he has never smoked. He does not have any smokeless tobacco history on file. He reports current alcohol use. No history on file for drug.  Allergies  Allergen Reactions  . Shellfish Allergy Hives    No family history on file.   Prior to Admission medications   Medication Sig Start Date End Date Taking? Authorizing Provider  acetaminophen (TYLENOL) 500 MG tablet Take 1,000 mg by mouth every 6 (six) hours as needed for headache (pain).   Yes [provider]  amLODIPine-Valsartan-HCTZ (EXFORGE HCT) 5-160-12.5 MG TABS Take 1 tablet by mouth daily.   Yes [provider]  aspirin EC 81 MG tablet Take 1 tablet (81 mg total) by mouth daily. 01/10/14  Yes Barrett, Evelene Croon, PA-C  BRILINTA 90 MG TABS tablet take 1 tablet  by mouth twice a day Patient taking differently: Take 90 mg by mouth daily.  07/24/15  Yes Jerline Pain, MD  Cyanocobalamin (VITAMIN B-12 PO) Take 1 tablet by mouth daily.   Yes [provider]  doxazosin (CARDURA) 4 MG tablet Take 4 mg by mouth daily.   Yes [provider]  empagliflozin (JARDIANCE) 25 MG TABS tablet Take 25 mg by mouth daily.   Yes [provider]  ibuprofen (ADVIL,MOTRIN) 200 MG tablet Take 2 tablets (400 mg total) by mouth every 8 (eight) hours  as needed for mild pain. 01/10/14  Yes Barrett, Evelene Croon, PA-C  linaGLIPtin-metFORMIN HCl (JENTADUETO) 2.03-999 MG TABS Take 2 tablets by mouth daily.   Yes [provider]  spironolactone (ALDACTONE) 25 MG tablet take 1 tablet by mouth once daily Patient taking differently: Take 25 mg by mouth daily.  04/09/15  Yes Jerline Pain, MD  Tetrahydrozoline HCl (VISINE OP) Place 1 drop into both eyes daily as needed (dry eyes/irritation).   Yes [provider]  atorvastatin (LIPITOR) 40 MG tablet take 1 tablet by mouth once daily AT 6PM Patient not taking: Reported on 10/01/2019 06/04/16   Jerline Pain, MD  metoprolol tartrate (LOPRESSOR) 25 MG tablet Take 1 tablet (25 mg total) by mouth 2 (two) times daily. Patient not taking: Reported on 09/27/2019 06/04/16   Jerline Pain, MD  nitroGLYCERIN (NITROSTAT) 0.4 MG SL tablet Place 1 tablet (0.4 mg total) under the tongue every 5 (five) minutes as needed for chest pain. Patient not taking: Reported on 09/24/2019 01/10/14   Reola Mosher    Physical Exam: Vitals:   09/30/2019 2045 10/11/2019 2130 10/12/2019 2145 10/01/2019 2215  BP: 105/61 114/62 120/63 107/61  Pulse: 66 67 70 66  Resp: (!) 25 (!) 34 (!) 28 (!) 21  Temp:      TempSrc:      SpO2: 94% 95% 93% 93%  Weight:      Height:          Constitutional: Very anxious, slightly tachypneic Vitals:   09/20/2019 2045 10/07/2019 2130 09/20/2019 2145 09/23/2019 2215  BP: 105/61 114/62 120/63 107/61  Pulse: 66 67 70 66  Resp: (!) 25 (!) 34 (!) 28 (!) 21  Temp:      TempSrc:      SpO2: 94% 95% 93% 93%  Weight:      Height:       Eyes: PERRL, lids and conjunctivae normal ENMT: Mucous membranes are moist. Posterior pharynx clear of any exudate or lesions.Normal dentition.  Neck: normal, supple, no masses, no thyromegaly Respiratory: Tachypneic, decreased air entry bilaterally, coarse breath sounds with rhonchi, no wheezing, slight use of accessory muscles  cardiovascular: Regular  rate and rhythm, no murmurs / rubs / gallops. No extremity edema. 2+ pedal pulses. No carotid bruits.  Abdomen: no tenderness, no masses palpated. No hepatosplenomegaly. Bowel sounds positive.  Musculoskeletal: no clubbing / cyanosis. No joint deformity upper and lower extremities. Good ROM, no contractures. Normal muscle tone.  Skin: no rashes, lesions, ulcers. No induration Neurologic: CN 2-12 grossly intact. Sensation intact, DTR normal. Strength 5/5 in all 4.  Psychiatric: Normal judgment and insight. Alert and oriented x 3. Normal mood.     Labs on Admission: I have personally reviewed following labs and imaging studies  CBC: Recent Labs  Lab 10/09/2019 1809  WBC 5.2  NEUTROABS 4.5  HGB 12.1*  HCT 37.3*  MCV 77.7*  PLT [id number]   Basic Metabolic  Panel: Recent Labs  Lab 09/21/2019 1809  NA 137  K 4.6  CL 103  CO2 21*  GLUCOSE 200*  BUN 25*  CREATININE 1.87*  CALCIUM 7.8*   GFR: Estimated Creatinine Clearance: 45.8 mL/min (A) (by C-G formula based on SCr of 1.87 mg/dL (H)). Liver Function Tests: Recent Labs  Lab 10/10/2019 1809  AST 29  ALT 19  ALKPHOS 32*  BILITOT 0.7  PROT 7.2  ALBUMIN 3.0*   No results for input(s): LIPASE, AMYLASE in the last 168 hours. No results for input(s): AMMONIA in the last 168 hours. Coagulation Profile: No results for input(s): INR, PROTIME in the last 168 hours. Cardiac Enzymes: No results for input(s): CKTOTAL, CKMB, CKMBINDEX, TROPONINI in the last 168 hours. BNP (last 3 results) No results for input(s): PROBNP in the last 8760 hours. HbA1C: No results for input(s): HGBA1C in the last 72 hours. CBG: No results for input(s): GLUCAP in the last 168 hours. Lipid Profile: No results for input(s): CHOL, HDL, LDLCALC, TRIG, CHOLHDL, LDLDIRECT in the last 72 hours. Thyroid Function Tests: No results for input(s): TSH, T4TOTAL, FREET4, T3FREE, THYROIDAB in the last 72 hours. Anemia Panel: No results for input(s): VITAMINB12, FOLATE,  FERRITIN, TIBC, IRON, RETICCTPCT in the last 72 hours. Urine analysis:    Component Value Date/Time   COLORURINE STRAW (A) 10/15/2019 1824   APPEARANCEUR CLEAR 10/01/2019 1824   LABSPEC 1.015 09/20/2019 1824   PHURINE 5.0 10/03/2019 1824   GLUCOSEU >=500 (A) 10/09/2019 1824   HGBUR NEGATIVE 10/14/2019 1824   BILIRUBINUR NEGATIVE 09/20/2019 1824   KETONESUR NEGATIVE 09/23/2019 1824   PROTEINUR NEGATIVE 09/28/2019 1824   NITRITE NEGATIVE 10/03/2019 1824   LEUKOCYTESUR NEGATIVE 10/09/2019 1824   Sepsis Labs: @LABRCNTIP (procalcitonin:4,lacticidven:4) ) Recent Results (from the past 240 hour(s))  SARS CORONAVIRUS 2 (TAT 6-24 HRS) Nasopharyngeal Nasopharyngeal Swab     Status: Abnormal   Collection Time: 09/27/2019  6:09 PM   Specimen: Nasopharyngeal Swab  Result Value Ref Range Status   SARS Coronavirus 2 POSITIVE (A) NEGATIVE Final    Comment: RESULT CALLED TO, READ BACK BY AND VERIFIED WITH: Herby Abraham, RN AT 2324 ON 10/15/2019 BY SAINVILUS S (NOTE) SARS-CoV-2 target nucleic acids are DETECTED. The SARS-CoV-2 RNA is generally detectable in upper and lower respiratory specimens during the acute phase of infection. Positive results are indicative of active infection with SARS-CoV-2. Clinical  correlation with patient history and other diagnostic information is necessary to determine patient infection status. Positive results do  not rule out bacterial infection or co-infection with other viruses. The expected result is Negative. Fact Sheet for Patients: SugarRoll.be Fact Sheet for Healthcare Providers: https://www.woods-mathews.com/ This test is not yet approved or cleared by the [place] FDA and  has been authorized for detection and/or diagnosis of SARS-CoV-2 by FDA under an Emergency Use Authorization (EUA). This EUA will remain  in effect (meaning this test can  be used) for the duration of the COVID-19 declaration under Section  564(b)(1) of the Act, 21 U.S.C. section 360bbb-3(b)(1), unless the authorization is terminated or revoked sooner. Performed at [facility], [place] [address] [place], [place] [address]      Radiological Exams on Admission: Dg Chest Port 1 View  Result Date: 10/05/2019 CLINICAL DATA:  Pt complains of chest pain x 3 days, non-radiating, worse with inspiration, cough, fever, and generally feeling unwell. Pt was unable to hold deep inspiration for x-ray due to coughing. EXAM: PORTABLE CHEST 1 VIEW COMPARISON:  01/07/2014 FINDINGS: Hazy interstitial  and airspace opacities present in the left perihilar region, right upper lobe, and left lower lobe. Low lung volumes are present, causing crowding of the pulmonary vasculature. No blunting of the costophrenic angles. Cardiac and mediastinal margins appear normal. IMPRESSION: 1. Hazy interstitial and airspace opacities in the left perihilar region, right upper lobe, and left lower lobe, suspicious for multilobar pneumonia or atypical pneumonia. 2. Low lung volumes. Electronically Signed   By: Van Clines M.D.   On: 10/16/2019 18:57    EKG: Independently reviewed.  It shows sinus tachycardia with no significant ST changes  Assessment/Plan Principal Problem:   Pneumonia due to COVID-19 virus Active Problems:   HTN (hypertension)   Diabetes mellitus ([place])   Coronary atherosclerosis of native coronary artery     #1 COVID-19 pneumonia: Patient will be admitted to the [employer] [facility] [employer]  Initiate IV Decadron, Lovenox, antibiotics, remdesivir as well as supportive care.  Further treatment by the primary team.  #2 diabetes: Sliding scale insulin with some oral agents.  Continue monitoring  #3 hypertension: Resume home regimen and continue treatment  #4 coronary artery disease: Stable at baseline.  No evidence of coronary artery decompensation.  Continue to monitor on telemetry   DVT prophylaxis: Lovenox Code Status: Full code Family  Communication: No family at bedside Disposition Plan: Home Consults called: None Admission status: Inpatient  Severity of Illness: The appropriate patient status for this patient is INPATIENT. Inpatient status is judged to be reasonable and necessary in order to provide the required intensity of service to ensure the patient's safety. The patient's presenting symptoms, physical exam findings, and initial radiographic and laboratory data in the context of their chronic comorbidities is felt to place them at high risk for further clinical deterioration. Furthermore, it is not anticipated that the patient will be medically stable for discharge from the hospital within 2 midnights of admission. The following factors support the patient status of inpatient.   " The patient's presenting symptoms include shortness of breath or cough. " The worrisome physical exam findings include temperature 101.3. " The initial radiographic and laboratory data are worrisome because of evidence of pneumonia bilaterally. " The chronic co-morbidities include diabetes with hypertension.   * I certify that at the point of admission it is my clinical judgment that the patient will require inpatient hospital care spanning beyond 2 midnights from the point of admission due to high intensity of service, high risk for further deterioration and high frequency of surveillance required.Barbette Merino MD [facility] Pager [phone]  If 7PM-7AM, please contact night-coverage [email or web address] Password [facility]  10/01/2019, 11:38 PM

## 2019-10-02 NOTE — ED Triage Notes (Signed)
Pt from home viz ems; c/o CP x 3 days, non-radiating, worse with inspiration; pt's sats found to be 80% on RA; placed on 15L NRB sats 96%; pt endorses some sob; given 125 solumedrol and 324 ASA PTA; hx cardiac stents, HTN, DM; pt also endorses dry cough and feeling febrile; denies sick contacts  T 98.84F 128/94 HR 82

## 2019-10-03 ENCOUNTER — Emergency Department (HOSPITAL_COMMUNITY): Payer: Commercial Managed Care - PPO

## 2019-10-03 ENCOUNTER — Other Ambulatory Visit: Payer: Self-pay

## 2019-10-03 ENCOUNTER — Inpatient Hospital Stay (HOSPITAL_COMMUNITY)
Admission: EM | Admit: 2019-10-03 | Discharge: 2019-11-18 | Disposition: E | Payer: Commercial Managed Care - PPO | Source: Home / Self Care | Attending: Internal Medicine | Admitting: Internal Medicine

## 2019-10-03 ENCOUNTER — Encounter (HOSPITAL_COMMUNITY): Payer: Self-pay

## 2019-10-03 DIAGNOSIS — U071 COVID-19: Secondary | ICD-10-CM

## 2019-10-03 DIAGNOSIS — E875 Hyperkalemia: Secondary | ICD-10-CM | POA: Diagnosis present

## 2019-10-03 DIAGNOSIS — E43 Unspecified severe protein-calorie malnutrition: Secondary | ICD-10-CM | POA: Insufficient documentation

## 2019-10-03 DIAGNOSIS — Z95828 Presence of other vascular implants and grafts: Secondary | ICD-10-CM

## 2019-10-03 DIAGNOSIS — I10 Essential (primary) hypertension: Secondary | ICD-10-CM | POA: Diagnosis present

## 2019-10-03 DIAGNOSIS — Z09 Encounter for follow-up examination after completed treatment for conditions other than malignant neoplasm: Secondary | ICD-10-CM

## 2019-10-03 DIAGNOSIS — N179 Acute kidney failure, unspecified: Secondary | ICD-10-CM | POA: Diagnosis present

## 2019-10-03 DIAGNOSIS — L8990 Pressure ulcer of unspecified site, unspecified stage: Secondary | ICD-10-CM | POA: Insufficient documentation

## 2019-10-03 DIAGNOSIS — I5032 Chronic diastolic (congestive) heart failure: Secondary | ICD-10-CM | POA: Diagnosis present

## 2019-10-03 DIAGNOSIS — E1165 Type 2 diabetes mellitus with hyperglycemia: Secondary | ICD-10-CM | POA: Diagnosis present

## 2019-10-03 DIAGNOSIS — J9601 Acute respiratory failure with hypoxia: Secondary | ICD-10-CM | POA: Diagnosis present

## 2019-10-03 DIAGNOSIS — J1282 Pneumonia due to coronavirus disease 2019: Secondary | ICD-10-CM | POA: Diagnosis present

## 2019-10-03 DIAGNOSIS — Z01818 Encounter for other preprocedural examination: Secondary | ICD-10-CM

## 2019-10-03 DIAGNOSIS — Z4659 Encounter for fitting and adjustment of other gastrointestinal appliance and device: Secondary | ICD-10-CM

## 2019-10-03 DIAGNOSIS — R0902 Hypoxemia: Secondary | ICD-10-CM

## 2019-10-03 DIAGNOSIS — Z0189 Encounter for other specified special examinations: Secondary | ICD-10-CM

## 2019-10-03 DIAGNOSIS — J9690 Respiratory failure, unspecified, unspecified whether with hypoxia or hypercapnia: Secondary | ICD-10-CM

## 2019-10-03 DIAGNOSIS — R0602 Shortness of breath: Secondary | ICD-10-CM

## 2019-10-03 DIAGNOSIS — J189 Pneumonia, unspecified organism: Secondary | ICD-10-CM

## 2019-10-03 DIAGNOSIS — IMO0002 Reserved for concepts with insufficient information to code with codable children: Secondary | ICD-10-CM | POA: Diagnosis present

## 2019-10-03 LAB — HEMOGLOBIN A1C
Hgb A1c MFr Bld: 8.1 % — ABNORMAL HIGH (ref 4.8–5.6)
Mean Plasma Glucose: 185.77 mg/dL

## 2019-10-03 LAB — COMPREHENSIVE METABOLIC PANEL
ALT: 21 U/L (ref 0–44)
AST: 32 U/L (ref 15–41)
Albumin: 3.2 g/dL — ABNORMAL LOW (ref 3.5–5.0)
Alkaline Phosphatase: 38 U/L (ref 38–126)
Anion gap: 15 (ref 5–15)
BUN: 38 mg/dL — ABNORMAL HIGH (ref 8–23)
CO2: 17 mmol/L — ABNORMAL LOW (ref 22–32)
Calcium: 7.7 mg/dL — ABNORMAL LOW (ref 8.9–10.3)
Chloride: 104 mmol/L (ref 98–111)
Creatinine, Ser: 1.75 mg/dL — ABNORMAL HIGH (ref 0.61–1.24)
GFR calc Af Amer: 47 mL/min — ABNORMAL LOW (ref >60)
GFR calc non Af Amer: 41 mL/min — ABNORMAL LOW (ref >60)
Glucose, Bld: 314 mg/dL — ABNORMAL HIGH (ref 70–99)
Potassium: 4.6 mmol/L (ref 3.5–5.1)
Sodium: 136 mmol/L (ref 135–145)
Total Bilirubin: 0.5 mg/dL (ref 0.3–1.2)
Total Protein: 7.5 g/dL (ref 6.5–8.1)

## 2019-10-03 LAB — CBC WITH DIFFERENTIAL/PLATELET
Abs Immature Granulocytes: 0.05 10*3/uL (ref 0.00–0.07)
Basophils Absolute: 0 10*3/uL (ref 0.0–0.1)
Basophils Relative: 0 %
Eosinophils Absolute: 0 10*3/uL (ref 0.0–0.5)
Eosinophils Relative: 0 %
HCT: 40.9 % (ref 39.0–52.0)
Hemoglobin: 13 g/dL (ref 13.0–17.0)
Immature Granulocytes: 1 %
Lymphocytes Relative: 7 %
Lymphs Abs: 0.6 10*3/uL — ABNORMAL LOW (ref 0.7–4.0)
MCH: 25.4 pg — ABNORMAL LOW (ref 26.0–34.0)
MCHC: 31.8 g/dL (ref 30.0–36.0)
MCV: 79.9 fL — ABNORMAL LOW (ref 80.0–100.0)
Monocytes Absolute: 0.7 10*3/uL (ref 0.1–1.0)
Monocytes Relative: 8 %
Neutro Abs: 8.2 10*3/uL — ABNORMAL HIGH (ref 1.7–7.7)
Neutrophils Relative %: 84 %
Platelets: 276 10*3/uL (ref 150–400)
RBC: 5.12 MIL/uL (ref 4.22–5.81)
RDW: 14.7 % (ref 11.5–15.5)
WBC: 9.6 10*3/uL (ref 4.0–10.5)
nRBC: 0.2 % (ref 0.0–0.2)

## 2019-10-03 LAB — PROCALCITONIN: Procalcitonin: 0.32 ng/mL

## 2019-10-03 LAB — LACTATE DEHYDROGENASE: LDH: 445 U/L — ABNORMAL HIGH (ref 98–192)

## 2019-10-03 LAB — LACTIC ACID, PLASMA
Lactic Acid, Venous: 3.6 mmol/L (ref 0.5–1.9)
Lactic Acid, Venous: 1.8 mmol/L (ref 0.5–1.9)

## 2019-10-03 LAB — TROPONIN I (HIGH SENSITIVITY)
Troponin I (High Sensitivity): 34 ng/L — ABNORMAL HIGH (ref <18)
Troponin I (High Sensitivity): 41 ng/L — ABNORMAL HIGH (ref <18)

## 2019-10-03 LAB — FIBRINOGEN: Fibrinogen: 694 mg/dL — ABNORMAL HIGH (ref 210–475)

## 2019-10-03 LAB — GLUCOSE, CAPILLARY: Glucose-Capillary: 314 mg/dL — ABNORMAL HIGH (ref 70–99)

## 2019-10-03 LAB — D-DIMER, QUANTITATIVE: D-Dimer, Quant: 1.42 ug/mL-FEU — ABNORMAL HIGH (ref 0.00–0.50)

## 2019-10-03 LAB — C-REACTIVE PROTEIN: CRP: 18 mg/dL — ABNORMAL HIGH (ref <1.0)

## 2019-10-03 MED ORDER — SODIUM CHLORIDE 0.9 % IV SOLN
100.0000 mg | INTRAVENOUS | Status: DC
  Administered 2019-10-03: 100 mg via INTRAVENOUS
  Filled 2019-10-03: qty 20
  Filled 2019-10-03: qty 100

## 2019-10-03 MED ORDER — DEXAMETHASONE SODIUM PHOSPHATE 10 MG/ML IJ SOLN
6.0000 mg | INTRAMUSCULAR | Status: DC
  Administered 2019-10-03: 6 mg via INTRAVENOUS
  Filled 2019-10-03: qty 1

## 2019-10-03 MED ORDER — ASPIRIN EC 81 MG PO TBEC
81.0000 mg | DELAYED_RELEASE_TABLET | Freq: Every day | ORAL | Status: DC
  Administered 2019-10-04 – 2019-10-20 (×17): 81 mg via ORAL
  Filled 2019-10-03 (×17): qty 1

## 2019-10-03 MED ORDER — TETRAHYDROZOLINE HCL 0.05 % OP SOLN
1.0000 [drp] | Freq: Every day | OPHTHALMIC | Status: DC | PRN
  Filled 2019-10-03: qty 15

## 2019-10-03 MED ORDER — ADULT MULTIVITAMIN W/MINERALS CH
1.0000 | ORAL_TABLET | Freq: Every day | ORAL | Status: DC
  Administered 2019-10-04 – 2019-10-21 (×18): 1 via ORAL
  Filled 2019-10-03 (×18): qty 1

## 2019-10-03 MED ORDER — METOPROLOL TARTRATE 25 MG PO TABS
12.5000 mg | ORAL_TABLET | Freq: Two times a day (BID) | ORAL | Status: DC
  Administered 2019-10-03 – 2019-10-15 (×23): 12.5 mg via ORAL
  Filled 2019-10-03 (×23): qty 1

## 2019-10-03 MED ORDER — ATORVASTATIN CALCIUM 40 MG PO TABS
40.0000 mg | ORAL_TABLET | Freq: Every day | ORAL | Status: DC
  Administered 2019-10-04 – 2019-10-19 (×16): 40 mg via ORAL
  Filled 2019-10-03 (×15): qty 1

## 2019-10-03 MED ORDER — VITAMIN C 500 MG PO TABS
500.0000 mg | ORAL_TABLET | Freq: Every day | ORAL | Status: DC
  Administered 2019-10-04 – 2019-10-23 (×20): 500 mg via ORAL
  Filled 2019-10-03 (×20): qty 1

## 2019-10-03 MED ORDER — ZINC SULFATE 220 (50 ZN) MG PO CAPS
220.0000 mg | ORAL_CAPSULE | Freq: Every day | ORAL | Status: DC
  Administered 2019-10-04 – 2019-10-23 (×20): 220 mg via ORAL
  Filled 2019-10-03 (×20): qty 1

## 2019-10-03 MED ORDER — TICAGRELOR 90 MG PO TABS
90.0000 mg | ORAL_TABLET | Freq: Two times a day (BID) | ORAL | Status: DC
  Administered 2019-10-03 – 2019-10-21 (×36): 90 mg via ORAL
  Filled 2019-10-03 (×39): qty 1

## 2019-10-03 MED ORDER — NITROGLYCERIN 0.4 MG SL SUBL: 0.4000 mg | SUBLINGUAL_TABLET | SUBLINGUAL | Status: DC | PRN

## 2019-10-03 MED ORDER — ENOXAPARIN SODIUM 40 MG/0.4ML ~~LOC~~ SOLN
40.0000 mg | SUBCUTANEOUS | Status: DC
  Administered 2019-10-03: 40 mg via SUBCUTANEOUS
  Filled 2019-10-03: qty 0.4

## 2019-10-03 MED ORDER — SODIUM CHLORIDE 0.9 % IV SOLN
100.0000 mg | Freq: Every day | INTRAVENOUS | Status: AC
  Administered 2019-10-04 – 2019-10-06 (×3): 100 mg via INTRAVENOUS
  Filled 2019-10-03 (×3): qty 100

## 2019-10-03 MED ORDER — SODIUM CHLORIDE 0.9 % IV SOLN
INTRAVENOUS | Status: DC
  Administered 2019-10-03 – 2019-10-04 (×3): via INTRAVENOUS

## 2019-10-03 MED ORDER — VITAMIN B-12 100 MCG PO TABS
100.0000 ug | ORAL_TABLET | Freq: Every day | ORAL | Status: DC
  Administered 2019-10-04 – 2019-10-28 (×25): 100 ug via ORAL
  Filled 2019-10-03 (×26): qty 1

## 2019-10-03 MED ORDER — ONDANSETRON HCL 4 MG/2ML IJ SOLN: 4.0000 mg | Freq: Four times a day (QID) | INTRAMUSCULAR | Status: DC | PRN

## 2019-10-03 MED ORDER — ACETAMINOPHEN 325 MG PO TABS: 650.0000 mg | ORAL_TABLET | Freq: Four times a day (QID) | ORAL | Status: DC | PRN

## 2019-10-03 MED ORDER — IPRATROPIUM-ALBUTEROL 20-100 MCG/ACT IN AERS
1.0000 | INHALATION_SPRAY | Freq: Four times a day (QID) | RESPIRATORY_TRACT | Status: DC
  Administered 2019-10-03 – 2019-10-04 (×3): 1 via RESPIRATORY_TRACT
  Filled 2019-10-03: qty 4

## 2019-10-03 MED ORDER — ONDANSETRON HCL 4 MG PO TABS: 4.0000 mg | ORAL_TABLET | Freq: Four times a day (QID) | ORAL | Status: DC | PRN

## 2019-10-03 MED ORDER — INSULIN DETEMIR 100 UNIT/ML ~~LOC~~ SOLN
5.0000 [IU] | Freq: Two times a day (BID) | SUBCUTANEOUS | Status: DC
  Administered 2019-10-03 – 2019-10-04 (×2): 5 [IU] via SUBCUTANEOUS
  Filled 2019-10-03 (×3): qty 0.05

## 2019-10-03 NOTE — ED Notes (Signed)
[facility] notified of need for transport of pt to [facility].  Per Abbe Amsterdam from [place]- transportation services may take awhile due to amount of patients requiring transport at this time.

## 2019-10-03 NOTE — ED Notes (Signed)
[facility] called to transport patient  

## 2019-10-03 NOTE — ED Notes (Signed)
This RN attempted to wean pt to 6L O2 by nasal cannula.  Pts O2 sats began to decrease to 86% on 6L O2.  Pt replaced on NRB, O2 sats increased to 93% on 15L.

## 2019-10-03 NOTE — ED Notes (Signed)
[facility] at bedside 

## 2019-10-03 NOTE — ED Triage Notes (Signed)
Pt POV- Reports dx with COVID yesterday, was seen yesterday at [facility]- left d/t insurance not paying for visit.  Pt reports feeling SHOB worsening today. Pt O2 sats 55% on room air. EDP made aware.

## 2019-10-03 NOTE — Progress Notes (Signed)
Inpatient Diabetes Program Recommendations  AACE/ADA: New Consensus Statement on Inpatient Glycemic Control (2015)  Target Ranges:  Prepandial:   less than 140 mg/dL      Peak postprandial:   less than 180 mg/dL (1-2 hours)      Critically ill patients:  140 - 180 mg/dL   Lab Results  Component Value Date   GLUCAP 129 (H) 03/27/2014   HGBA1C 8.9 (H) 01/09/2014    Review of Glycemic Control  Diabetes history: DM2 Outpatient Diabetes medications: Jardiance 25 mg QD, Jentadueto 2.03/999 mg 2 tabs QD Current orders for Inpatient glycemic control: None  Need updated HgbA1C   Inpatient Diabetes Program Recommendations:    Add Levemir 10 units bid Novolog 0-15 units tidwc and 0-5 units QHS  Will secure text MD.  Thank you. Lorenda Peck, RD, LDN, CDE Inpatient Diabetes Coordinator [phone]

## 2019-10-03 NOTE — ED Notes (Signed)
Admitting paged to RN per her request 

## 2019-10-03 NOTE — ED Provider Notes (Signed)
[facility] DEPT Provider Note   CSN: [id number] Arrival date & time: 09/27/2019  1324     History   Chief Complaint Chief Complaint  Patient presents with   COVID positive   Shortness of Breath    HPI William [name] is a 63 y.o. male.     The history is provided by the patient and medical records. No language interpreter was used.  Shortness of Breath Severity:  Severe Onset quality:  Gradual Duration:  2 days Timing:  Constant Progression:  Worsening Chronicity:  New Context: URI   Relieved by:  Nothing Worsened by:  Nothing Ineffective treatments:  None tried Associated symptoms: chest pain, cough and fever   Associated symptoms: no abdominal pain, no diaphoresis, no headaches, no neck pain, no rash, no vomiting and no wheezing     Past Medical History:  Diagnosis Date   CAD (coronary artery disease)    a. NSTEMI (2/15):  LHC (01/07/14):  pLAD 75, mLAD 80, mOM1 99, LVEDP 20.  PCI:  Promus (3 x 12 mm) DES to mLAD; Promus (3 x 16 mm) DES to pLAD; Promus (2.5 x 16 mm) DES to mOM1.   Diabetes mellitus 2015   H/O non-ST elevation myocardial infarction (NSTEMI) 12/2013   Hx of echocardiogram    a.  Echo (01/08/14):  Mild LVH, EF 55-60%, no RWMA, mod LAE   Hypertension     Patient Active Problem List   Diagnosis Date Noted   CAP (community acquired pneumonia) 09/30/2019   Pneumonia due to COVID-19 virus 09/25/2019   Old non-ST elevation myocardial infarction (NSTEMI) 01/18/2014   Coronary atherosclerosis of native coronary artery 01/18/2014   Diabetes mellitus ([place])    HTN (hypertension) 01/07/2014    Past Surgical History:  Procedure Laterality Date   CORONARY ANGIOPLASTY WITH STENT PLACEMENT  Feb 2015   proxLAD - 3.0x16DES, midLAD -3.0x12 DES, CFX - 2.5x16 DES   LEFT HEART CATHETERIZATION WITH CORONARY ANGIOGRAM N/A 01/09/2014   Procedure: LEFT HEART CATHETERIZATION WITH CORONARY ANGIOGRAM;  Surgeon: Jettie Booze, MD;  Location[facility] CATH LAB;  Service: Cardiovascular;  Laterality: N/A;        Home Medications    Prior to Admission medications   Medication Sig Start Date End Date Taking? Authorizing Provider  acetaminophen (TYLENOL) 500 MG tablet Take 1,000 mg by mouth every 6 (six) hours as needed for headache (pain).    [provider]  amLODIPine-Valsartan-HCTZ (EXFORGE HCT) 5-160-12.5 MG TABS Take 1 tablet by mouth daily.    [provider]  aspirin EC 81 MG tablet Take 1 tablet (81 mg total) by mouth daily. 01/10/14   Barrett, Evelene Croon, PA-C  atorvastatin (LIPITOR) 40 MG tablet take 1 tablet by mouth once daily AT 6PM Patient not taking: Reported on 09/19/2019 06/04/16   Jerline Pain, MD  BRILINTA 90 MG TABS tablet take 1 tablet by mouth twice a day Patient taking differently: Take 90 mg by mouth daily.  07/24/15   Jerline Pain, MD  Cyanocobalamin (VITAMIN B-12 PO) Take 1 tablet by mouth daily.    [provider]  doxazosin (CARDURA) 4 MG tablet Take 4 mg by mouth daily.    [provider]  empagliflozin (JARDIANCE) 25 MG TABS tablet Take 25 mg by mouth daily.    [provider]  ibuprofen (ADVIL,MOTRIN) 200 MG tablet Take 2 tablets (400 mg total) by mouth every 8 (eight) hours as needed for mild pain. 01/10/14   Barrett,  Evelene Croon, PA-C  linaGLIPtin-metFORMIN HCl (JENTADUETO) 2.03-999 MG TABS Take 2 tablets by mouth daily.    [provider]  metoprolol tartrate (LOPRESSOR) 25 MG tablet Take 1 tablet (25 mg total) by mouth 2 (two) times daily. Patient not taking: Reported on 09/30/2019 06/04/16   Jerline Pain, MD  nitroGLYCERIN (NITROSTAT) 0.4 MG SL tablet Place 1 tablet (0.4 mg total) under the tongue every 5 (five) minutes as needed for chest pain. Patient not taking: Reported on 09/29/2019 01/10/14   Barrett, Evelene Croon, PA-C  spironolactone (ALDACTONE) 25 MG tablet take 1 tablet by mouth once daily Patient taking differently: Take  25 mg by mouth daily.  04/09/15   Jerline Pain, MD  Tetrahydrozoline HCl (VISINE OP) Place 1 drop into both eyes daily as needed (dry eyes/irritation).    [provider]    Family History No family history on file.  Social History Social History   Tobacco Use   Smoking status: Never Smoker  Substance Use Topics   Alcohol use: Yes   Drug use: Not on file     Allergies   Shellfish allergy   Review of Systems Review of Systems  Constitutional: Positive for chills, fatigue and fever. Negative for diaphoresis.  HENT: Positive for congestion.   Eyes: Negative for visual disturbance.  Respiratory: Positive for cough, chest tightness and shortness of breath. Negative for wheezing and stridor.   Cardiovascular: Positive for chest pain. Negative for palpitations and leg swelling.  Gastrointestinal: Negative for abdominal pain, constipation, diarrhea, nausea and vomiting.  Genitourinary: Negative for dysuria.  Musculoskeletal: Negative for back pain, neck pain and neck stiffness.  Skin: Negative for rash and wound.  Neurological: Negative for light-headedness and headaches.  Psychiatric/Behavioral: Negative for agitation.  All other systems reviewed and are negative.    Physical Exam Updated Vital Signs Pulse 84    Temp 98.7 F (37.1 C) (Oral)    Resp (!) 35    SpO2 95%   Physical Exam Vitals signs and nursing note reviewed.  Constitutional:      General: He is not in acute distress.    Appearance: He is well-developed. He is not ill-appearing, toxic-appearing or diaphoretic.  HENT:     Head: Normocephalic and atraumatic.     Mouth/Throat:     Mouth: Mucous membranes are moist.  Eyes:     Conjunctiva/sclera: Conjunctivae normal.     Pupils: Pupils are equal, round, and reactive to light.  Neck:     Musculoskeletal: Normal range of motion and neck supple.  Cardiovascular:     Rate and Rhythm: Normal rate and regular rhythm.     Heart sounds: No murmur.    Pulmonary:     Effort: Pulmonary effort is normal. Tachypnea present. No respiratory distress.     Breath sounds: Rhonchi present. No wheezing.  Chest:     Chest wall: No tenderness.  Abdominal:     Palpations: Abdomen is soft.     Tenderness: There is no abdominal tenderness.  Musculoskeletal:     Right lower leg: He exhibits no tenderness. No edema.     Left lower leg: He exhibits no tenderness. No edema.  Skin:    General: Skin is warm and dry.     Capillary Refill: Capillary refill takes less than 2 seconds.  Neurological:     General: No focal deficit present.     Mental Status: He is alert.  Psychiatric:        Mood and  Affect: Mood normal.      ED Treatments / Results  Labs (all labs ordered are listed, but only abnormal results are displayed) Labs Reviewed  CBC WITH DIFFERENTIAL/PLATELET - Abnormal; Notable for the following components:      Result Value   MCV 79.9 (*)    MCH 25.4 (*)    Neutro Abs 8.2 (*)    Lymphs Abs 0.6 (*)    All other components within normal limits  COMPREHENSIVE METABOLIC PANEL - Abnormal; Notable for the following components:   CO2 17 (*)    Glucose, Bld 314 (*)    BUN 38 (*)    Creatinine, Ser 1.75 (*)    Calcium 7.7 (*)    Albumin 3.2 (*)    GFR calc non Af Amer 41 (*)    GFR calc Af Amer 47 (*)    All other components within normal limits  LACTIC ACID, PLASMA - Abnormal; Notable for the following components:   Lactic Acid, Venous 3.6 (*)    All other components within normal limits  TROPONIN I (HIGH SENSITIVITY) - Abnormal; Notable for the following components:   Troponin I (High Sensitivity) 34 (*)    All other components within normal limits  CULTURE, BLOOD (ROUTINE X 2)  CULTURE, BLOOD (ROUTINE X 2)  LACTIC ACID, PLASMA  HIV ANTIBODY (ROUTINE TESTING W REFLEX)  LACTATE DEHYDROGENASE  CBC  CREATININE, SERUM  C-REACTIVE PROTEIN  D-DIMER, QUANTITATIVE (NOT AT [facility])  FIBRINOGEN  PROCALCITONIN  TROPONIN I (HIGH  SENSITIVITY)    EKG EKG Interpretation  Date/Time:  Monday October 03 2019 13:53:20 EST Ventricular Rate:  83 PR Interval:    QRS Duration: 97 QT Interval:  352 QTC Calculation: 414 R Axis:   41 Text Interpretation: Sinus rhythm Prolonged PR interval Probable left atrial enlargement Borderline ST elevation, anterior leads When compared to prior, faster rate. No STEMI Confirmed by Antony Blackbird [phone]) on 09/20/2019 4:20:25 PM   Radiology Dg Chest Portable 1 View  Result Date: 10/17/2019 CLINICAL DATA:  Hypoxia, shortness of breath. EXAM: PORTABLE CHEST 1 VIEW COMPARISON:  October 02, 2019. FINDINGS: The heart size and mediastinal contours are within normal limits. No pneumothorax or pleural effusion is noted. Stable left perihilar and lingular opacity is noted as well as mild right upper lobe opacity consistent with multifocal pneumonia. The visualized skeletal structures are unremarkable. IMPRESSION: Stable bilateral lung opacities are noted, left greater than right, consistent with multifocal pneumonia. Electronically Signed   By: Marijo Conception M.D.   On: 10/04/2019 14:14   Dg Chest Port 1 View  Result Date: 09/27/2019 CLINICAL DATA:  Pt complains of chest pain x 3 days, non-radiating, worse with inspiration, cough, fever, and generally feeling unwell. Pt was unable to hold deep inspiration for x-ray due to coughing. EXAM: PORTABLE CHEST 1 VIEW COMPARISON:  01/07/2014 FINDINGS: Hazy interstitial and airspace opacities present in the left perihilar region, right upper lobe, and left lower lobe. Low lung volumes are present, causing crowding of the pulmonary vasculature. No blunting of the costophrenic angles. Cardiac and mediastinal margins appear normal. IMPRESSION: 1. Hazy interstitial and airspace opacities in the left perihilar region, right upper lobe, and left lower lobe, suspicious for multilobar pneumonia or atypical pneumonia. 2. Low lung volumes. Electronically Signed   By:  Van Clines M.D.   On: 10/14/2019 18:57    Procedures Procedures (including critical care time)  CRITICAL CARE Performed by: Gwenyth Allegra Caridad Silveira Total critical care time: 35 minutes Critical care  time was exclusive of separately billable procedures and treating other patients. COVID-19 infection with oxygen saturations in the 50s requiring nonrebreather. Critical care was necessary to treat or prevent imminent or life-threatening deterioration. Critical care was time spent personally by me on the following activities: development of treatment plan with patient and/or surrogate as well as nursing, discussions with consultants, evaluation of patient's response to treatment, examination of patient, obtaining history from patient or surrogate, ordering and performing treatments and interventions, ordering and review of laboratory studies, ordering and review of radiographic studies, pulse oximetry and re-evaluation of patient's condition.  William [name] was evaluated in Emergency Department on 10/10/2019 for the symptoms described in the history of present illness. He was evaluated in the context of the global COVID-19 pandemic, which necessitated consideration that the patient might be at risk for infection with the SARS-CoV-2 virus that causes COVID-19. Institutional protocols and algorithms that pertain to the evaluation of patients at risk for COVID-19 are in a state of rapid change based on information released by regulatory bodies including the CDC and federal and state organizations. These policies and algorithms were followed during the patient's care in the ED.   Medications Ordered in ED Medications  0.9 %  sodium chloride infusion (has no administration in time range)  acetaminophen (TYLENOL) tablet 650 mg (has no administration in time range)  remdesivir 100 mg in sodium chloride 0.9 % 250 mL IVPB (has no administration in time range)     Initial Impression / Assessment  and Plan / ED Course  I have reviewed the triage vital signs and the nursing notes.  Pertinent labs & imaging results that were available during my care of the patient were reviewed by me and considered in my medical decision making (see chart for details).        William [name] is a 63 y.o. male with a past medical history significant for CAD, hypertension, diabetes, and recent diagnosis of COVID-19 infection who presents with hypoxia and worsening shortness of breath.  Patient reports that he was seen last night at [facility] and was being admitted for pneumonia and COVID-19 infection.  While awaiting his admission early this morning, he decided wanted to leave [place].  Patient was satting in the 80s at time of leaving AMA.  He reports that after going home he continued of shortness of breath and then presents for further evaluation.  On arrival, oxygen saturations are in the 50% range.  Patient is not having significant chest pain but does feel chest tightness.  He denies nausea, vomiting, urinary symptoms or GI symptoms.   On exam, patient does have hypoxia with oxygen saturations around 50% on arrival.  He was placed on nonrebreather.  Lungs were coarse bilaterally with no wheezing.  Chest nontender.  Abdomen nontender.  Pulses reassuring in extremities.  Oxygen saturations improved into the 90s on the nonrebreather.  As patient was admitted overnight, we will call to readmit him.  We will get some screening labs and repeat chest x-ray to make sure there is no pneumothorax or other new significant abnormality.  He is already COVID-19 positive, will not retest this.  With lack of pain, doubt new cardiac etiology.  Hospital team called and they will admit for further management.  Will defer starting all the antibiotics and other medications to them.  Patient will be admitted for further Covid infection.   Final Clinical Impressions(s) / ED Diagnoses   Final diagnoses:    Hypoxia  SOB (  shortness of breath)  COVID-19    ED Discharge Orders    None      Clinical Impression: 1. Hypoxia   2. SOB (shortness of breath)   3. COVID-19     Disposition: Admit  This note was prepared with assistance of Dragon voice recognition software. Occasional wrong-word or sound-a-like substitutions may have occurred due to the inherent limitations of voice recognition software.     William [name], Gwenyth Allegra, MD 10/07/2019 1622

## 2019-10-03 NOTE — H&P (Addendum)
History and Physical    [name] [id number] DOB: January 21, 1956 DOA: 09/25/2019  PCP: [name], MD Patient coming from: Home, was recently evaluated at [facility] yesterday  I have personally briefly reviewed patient's old medical records in [place]  Chief Complaint: Shortness of breath/known Covid pneumonia  HPI: William [name] is a 63 y.o. male with medical history significant of hypertension, hyperlipidemia, coronary disease, diabetes mellitus, history of DVT, and recent diagnosis of Covid who presents with worsening symptoms.  Patient was in usual state of health through last Wednesday.  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  He never had significant fevers but reports that he been having shortness of breath and cough which have been progressive and worsening over the past several days.  He denies any anosmia, lack of taste, diarrhea.  He denies any known Covid exposures.  He lives at home with his family but reports he has been staying downstairs with a wheezing upstairs.  He denies any history of asthma or COPD.  He does not smoke.  He was evaluated in [facility] emergency room yesterday and plan for admission but left AMA due to concerns about insurance paying for his stay.  In the last couple days he has had worsening dyspnea on exertion becoming winded from walking to the bathroom and back.  Patient was evaluated and admitted yesterday at [facility].  He had been hypoxemic to 80% on room air at the time of his arrival there and subsequently needed 15 L via nonrebreather.  He is presently on nonrebreather.  Patient reports having a remote history of DVT but no current swelling or pain in extremities.  No inspiratory pain.  He does not smoke, very occasional beer, no other drug use  Review of Systems: As per HPI otherwise 10 point review of systems negative.   Past Medical  History:  Diagnosis Date  . CAD (coronary artery disease)    a. NSTEMI (2/15):  LHC (01/07/14):  pLAD 75, mLAD 80, mOM1 99, LVEDP 20.  PCI:  Promus (3 x 12 mm) DES to mLAD; Promus (3 x 16 mm) DES to pLAD; Promus (2.5 x 16 mm) DES to mOM1.  . Diabetes mellitus ([place]) 2015  . H/O non-ST elevation myocardial infarction (NSTEMI) 12/2013  . Hx of echocardiogram    a.  Echo (01/08/14):  Mild LVH, EF 55-60%, no RWMA, mod LAE  . Hypertension     Past Surgical History:  Procedure Laterality Date  . CORONARY ANGIOPLASTY WITH STENT PLACEMENT  Feb 2015   proxLAD - 3.0x16DES, midLAD -3.0x12 DES, CFX - 2.5x16 DES  . LEFT HEART CATHETERIZATION WITH CORONARY ANGIOGRAM N/A 01/09/2014   Procedure: LEFT HEART CATHETERIZATION WITH CORONARY ANGIOGRAM;  Surgeon: Jettie Booze, MD;  Location: [facility] CATH LAB;  Service: Cardiovascular;  Laterality: N/A;     reports that he has never smoked. He has never used smokeless tobacco. He reports current alcohol use. No history on file for drug.  Allergies  Allergen Reactions  . Shellfish Allergy Hives    History reviewed. No pertinent family history.   Prior to Admission medications   Medication Sig Start Date End Date Taking? Authorizing Provider  acetaminophen (TYLENOL) 500 MG tablet Take 1,000 mg by mouth every 6 (six) hours as needed for headache (pain).    [provider]  amLODIPine-Valsartan-HCTZ (EXFORGE HCT) 5-160-12.5 MG TABS Take 1 tablet by mouth daily.  [provider]  aspirin EC 81 MG tablet Take 1 tablet (81 mg total) by mouth daily. 01/10/14   Barrett, Evelene Croon, PA-C  atorvastatin (LIPITOR) 40 MG tablet take 1 tablet by mouth once daily AT 6PM Patient not taking: Reported on 09/28/2019 06/04/16   Jerline Pain, MD  BRILINTA 90 MG TABS tablet take 1 tablet by mouth twice a day Patient taking differently: Take 90 mg by mouth daily.  07/24/15   Jerline Pain, MD  Cyanocobalamin (VITAMIN B-12 PO) Take 1 tablet by mouth daily.     [provider]  doxazosin (CARDURA) 4 MG tablet Take 4 mg by mouth daily.    [provider]  empagliflozin (JARDIANCE) 25 MG TABS tablet Take 25 mg by mouth daily.    [provider]  ibuprofen (ADVIL,MOTRIN) 200 MG tablet Take 2 tablets (400 mg total) by mouth every 8 (eight) hours as needed for mild pain. 01/10/14   Barrett, Evelene Croon, PA-C  linaGLIPtin-metFORMIN HCl (JENTADUETO) 2.03-999 MG TABS Take 2 tablets by mouth daily.    [provider]  metoprolol tartrate (LOPRESSOR) 25 MG tablet Take 1 tablet (25 mg total) by mouth 2 (two) times daily. Patient not taking: Reported on 10/16/2019 06/04/16   Jerline Pain, MD  nitroGLYCERIN (NITROSTAT) 0.4 MG SL tablet Place 1 tablet (0.4 mg total) under the tongue every 5 (five) minutes as needed for chest pain. Patient not taking: Reported on 10/09/2019 01/10/14   Barrett, Evelene Croon, PA-C  spironolactone (ALDACTONE) 25 MG tablet take 1 tablet by mouth once daily Patient taking differently: Take 25 mg by mouth daily.  04/09/15   Jerline Pain, MD  Tetrahydrozoline HCl (VISINE OP) Place 1 drop into both eyes daily as needed (dry eyes/irritation).    [provider]    Physical Exam: Vitals:   10/02/2019 1346 10/01/2019 1347 09/20/2019 1415 10/12/2019 1445  BP:  (!) 124/59 (!) 109/58   Pulse:   81 77  Resp:   (!) 34 (!) 34  Temp:      TempSrc:      SpO2: 94%  96% 97%  Weight:  102.1 kg    Height:  5' 6.5" (1.689 m)      Constitutional: NAD, calm, comfortable Vitals:   09/28/2019 1346 10/07/2019 1347 10/02/2019 1415 10/03/19 1445  BP:  (!) 124/59 (!) 109/58   Pulse:   81 77  Resp:   (!) 34 (!) 34  Temp:      TempSrc:      SpO2: 94%  96% 97%  Weight:  102.1 kg    Height:  5' 6.5" (1.689 m)     General: Wearing nonrebreather but appears comfortable in no distress, conversing without fatigue Eyes: PERRL, lids and conjunctivae normal anicteric sclera ENMT: Mucous membranes are moist. Posterior pharynx clear  of any exudate or lesions.Normal dentition.  Neck: normal, supple, no masses, no thyromegaly Respiratory: Tachypneic, no wheezing appreciated, no crackles appreciated, maintaining conversation and good respiratory effort Cardiovascular: Regular rate and rhythm, no murmurs / rubs / gallops. No extremity edema. 2+ pedal pulses. No carotid bruits.  Abdomen: no tenderness, no masses palpated. No hepatosplenomegaly. Bowel sounds positive.  Musculoskeletal: no clubbing / cyanosis. No joint deformity upper and lower extremities. Good ROM, no contractures. Normal muscle tone.  Skin: no rashes, lesions, ulcers. No induration Neurologic: CN 2-12 grossly intact.  Strength and sensation grossly intact, no focal deficits appreciated on exam Psychiatric: Alert and oriented x3, pleasant mood and  affect  Labs on Admission: I have personally reviewed following labs and imaging studies  CBC: Recent Labs  Lab 10/06/2019 1809 09/25/2019 1340  WBC 5.2 9.6  NEUTROABS 4.5 8.2*  HGB 12.1* 13.0  HCT 37.3* 40.9  MCV 77.7* 79.9*  PLT 257 [id number]   Basic Metabolic Panel: Recent Labs  Lab 09/29/2019 1809 10/13/2019 1340  NA 137 136  K 4.6 4.6  CL 103 104  CO2 21* 17*  GLUCOSE 200* 314*  BUN 25* 38*  CREATININE 1.87* 1.75*  CALCIUM 7.8* 7.7*   GFR: Estimated Creatinine Clearance: 49.4 mL/min (A) (by C-G formula based on SCr of 1.75 mg/dL (H)). Liver Function Tests: Recent Labs  Lab 09/27/2019 1809 09/26/2019 1340  AST 29 32  ALT 19 21  ALKPHOS 32* 38  BILITOT 0.7 0.5  PROT 7.2 7.5  ALBUMIN 3.0* 3.2*   No results for input(s): LIPASE, AMYLASE in the last 168 hours. No results for input(s): AMMONIA in the last 168 hours. Coagulation Profile: No results for input(s): INR, PROTIME in the last 168 hours. Cardiac Enzymes: No results for input(s): CKTOTAL, CKMB, CKMBINDEX, TROPONINI in the last 168 hours. BNP (last 3 results) No results for input(s): PROBNP in the last 8760 hours. HbA1C: No results for  input(s): HGBA1C in the last 72 hours. CBG: No results for input(s): GLUCAP in the last 168 hours. Lipid Profile: No results for input(s): CHOL, HDL, LDLCALC, TRIG, CHOLHDL, LDLDIRECT in the last 72 hours. Thyroid Function Tests: No results for input(s): TSH, T4TOTAL, FREET4, T3FREE, THYROIDAB in the last 72 hours. Anemia Panel: No results for input(s): VITAMINB12, FOLATE, FERRITIN, TIBC, IRON, RETICCTPCT in the last 72 hours. Urine analysis:    Component Value Date/Time   COLORURINE STRAW (A) 10/17/2019 1824   APPEARANCEUR CLEAR 09/22/2019 1824   LABSPEC 1.015 10/15/2019 1824   PHURINE 5.0 09/29/2019 1824   GLUCOSEU >=500 (A) 10/14/2019 1824   HGBUR NEGATIVE 09/26/2019 1824   BILIRUBINUR NEGATIVE 10/17/2019 1824   KETONESUR NEGATIVE 10/01/2019 1824   PROTEINUR NEGATIVE 10/08/2019 1824   NITRITE NEGATIVE 09/18/2019 1824   LEUKOCYTESUR NEGATIVE 09/18/2019 1824    Radiological Exams on Admission: Dg Chest Portable 1 View  Result Date: 10/09/2019 CLINICAL DATA:  Hypoxia, shortness of breath. EXAM: PORTABLE CHEST 1 VIEW COMPARISON:  October 02, 2019. FINDINGS: The heart size and mediastinal contours are within normal limits. No pneumothorax or pleural effusion is noted. Stable left perihilar and lingular opacity is noted as well as mild right upper lobe opacity consistent with multifocal pneumonia. The visualized skeletal structures are unremarkable. IMPRESSION: Stable bilateral lung opacities are noted, left greater than right, consistent with multifocal pneumonia. Electronically Signed   By: Marijo Conception M.D.   On: 09/22/2019 14:14   Dg Chest Port 1 View  Result Date: 10/03/2019 CLINICAL DATA:  Pt complains of chest pain x 3 days, non-radiating, worse with inspiration, cough, fever, and generally feeling unwell. Pt was unable to hold deep inspiration for x-ray due to coughing. EXAM: PORTABLE CHEST 1 VIEW COMPARISON:  01/07/2014 FINDINGS: Hazy interstitial and airspace opacities  present in the left perihilar region, right upper lobe, and left lower lobe. Low lung volumes are present, causing crowding of the pulmonary vasculature. No blunting of the costophrenic angles. Cardiac and mediastinal margins appear normal. IMPRESSION: 1. Hazy interstitial and airspace opacities in the left perihilar region, right upper lobe, and left lower lobe, suspicious for multilobar pneumonia or atypical pneumonia. 2. Low lung volumes. Electronically Signed   By: Thayer Jew  Janeece Fitting M.D.   On: 10/09/2019 18:57    EKG: Independently reviewed.   Assessment/Plan Herny Hewes is a 63 y.o. male with medical history significant of hypertension, hyperlipidemia, coronary disease status post PCI, diabetes mellitus, history of DVT? (Self-reports over a decade ago), and recent diagnosis of Covid who presents with worsening symptoms and continued acute hypoxemic respiratory failure.  #COVID-19 pneumonia #Acute hypoxic respiratory failure -Patient with documented positive test in our system 11/15 and symptoms consistent with pneumonia.  His chest x-ray is consistent with multifocal pneumonia.  I suspect this is all Covid and not secondary bacterial pneumonia but will continue to evaluate and obtain procalcitonin and monitor clinical picture. -Continue dexamethasone and remdesivir per pharmacy -Continue to trend inflammatory markers, ferritin, fibrinogen, LDH, D-dimer, CRP -I suspect the mild elevation in high-sensitivity troponin is secondary to demand, may be related to Covid myopericarditis as well -We will continue IV fluids due to elevated lactate and trend, current lactate is 3.6  #Coronary artery disease, status post history of PCI -Patient seems to have been off of some of these medications including a statin and takes his Brilinta daily as opposed to twice daily -Recommend resumption of aspirin, statin -I have resumed his Brilinta twice daily but he should follow-up with cardiology given his  stents were in 2015 and they may recommend monotherapy of patient cannot comply with twice daily dosing - have ordered the spironolactone but will d/c; resume once AKI/Lactate normalize  #AKI versus CKD -No recent creatinine to compare to and given elevated lactate patient may be dehydrated, thus have held his amlodipine-valsartan-HCTZ combination pill -Continue IV hydration and monitor renal function -Monitor I's and O's and avoid nephrotoxic agents  #Type 2 diabetes mellitus -Most recent A1c is from 2015, 8.9 -Held oral agents, will continue with insulin sliding scale - Reviewed RD recommendations regarding DM coverage, would hesitate to place 10 U BID while awaiting more recent A1c given either CKD or AKI.  He is hyperglycemic but this may simply be steroid effect and in that setting it may be more reasonable to transition coverage to NPH as it peaks with there steroid effect - have added a lower dose of 5U BID of levemir for now as we await A1c  #Hypertension -Resume metoprolol, held other agents  # HLD - resume atorvastatin  DVT prophylaxis: Lovenox Code Status: Full Family Communication: Patient's family is aware Disposition Plan: Admit to [facility]  Admission status: Nicki Reaper MD [facility] Pager [phone]  If 7PM-7AM, please contact night-coverage [email or web address] Password [email or web address]  10/08/2019, 3:05 PM

## 2019-10-03 NOTE — ED Notes (Signed)
Pt and wife wanting to leave AMA, states there is not treatment for COVID 19 any way and it will be better for pt to died at home and no on the hospital. Pt and wife oriented that he will have better outcome getting treatment that at home without treatment. Pt and wife still refusing to be admitted. Dr. Jonelle Sidle to be notified.

## 2019-10-03 NOTE — ED Notes (Signed)
Pt left AMA, pt oriented that he is leaving against medical advice, that his oxygen is on the low 80% on RA and he really will need Oxygen. Pt states " there is no treatment for COVID and I prefer to be dead on my house than in the Hospital"

## 2019-10-04 ENCOUNTER — Inpatient Hospital Stay (HOSPITAL_COMMUNITY): Payer: Commercial Managed Care - PPO

## 2019-10-04 DIAGNOSIS — M7989 Other specified soft tissue disorders: Secondary | ICD-10-CM | POA: Diagnosis not present

## 2019-10-04 DIAGNOSIS — E785 Hyperlipidemia, unspecified: Secondary | ICD-10-CM

## 2019-10-04 DIAGNOSIS — R0602 Shortness of breath: Secondary | ICD-10-CM

## 2019-10-04 DIAGNOSIS — U071 COVID-19: Principal | ICD-10-CM

## 2019-10-04 DIAGNOSIS — I2583 Coronary atherosclerosis due to lipid rich plaque: Secondary | ICD-10-CM

## 2019-10-04 DIAGNOSIS — I2510 Atherosclerotic heart disease of native coronary artery without angina pectoris: Secondary | ICD-10-CM

## 2019-10-04 DIAGNOSIS — J1289 Other viral pneumonia: Secondary | ICD-10-CM | POA: Diagnosis not present

## 2019-10-04 DIAGNOSIS — J80 Acute respiratory distress syndrome: Secondary | ICD-10-CM

## 2019-10-04 LAB — MAGNESIUM: Magnesium: 3.7 mg/dL — ABNORMAL HIGH (ref 1.7–2.4)

## 2019-10-04 LAB — COMPREHENSIVE METABOLIC PANEL
ALT: 17 U/L (ref 0–44)
AST: 26 U/L (ref 15–41)
Albumin: 3 g/dL — ABNORMAL LOW (ref 3.5–5.0)
Alkaline Phosphatase: 44 U/L (ref 38–126)
Anion gap: 13 (ref 5–15)
BUN: 44 mg/dL — ABNORMAL HIGH (ref 8–23)
CO2: 23 mmol/L (ref 22–32)
Calcium: 8.1 mg/dL — ABNORMAL LOW (ref 8.9–10.3)
Chloride: 111 mmol/L (ref 98–111)
Creatinine, Ser: 1.62 mg/dL — ABNORMAL HIGH (ref 0.61–1.24)
GFR calc Af Amer: 52 mL/min — ABNORMAL LOW (ref >60)
GFR calc non Af Amer: 45 mL/min — ABNORMAL LOW (ref >60)
Glucose, Bld: 229 mg/dL — ABNORMAL HIGH (ref 70–99)
Potassium: 5 mmol/L (ref 3.5–5.1)
Sodium: 147 mmol/L — ABNORMAL HIGH (ref 135–145)
Total Bilirubin: 0.4 mg/dL (ref 0.3–1.2)
Total Protein: 7.3 g/dL (ref 6.5–8.1)

## 2019-10-04 LAB — CBC WITH DIFFERENTIAL/PLATELET
Abs Immature Granulocytes: 0.06 10*3/uL (ref 0.00–0.07)
Basophils Absolute: 0 10*3/uL (ref 0.0–0.1)
Basophils Relative: 0 %
Eosinophils Absolute: 0 10*3/uL (ref 0.0–0.5)
Eosinophils Relative: 0 %
HCT: 39.9 % (ref 39.0–52.0)
Hemoglobin: 12.6 g/dL — ABNORMAL LOW (ref 13.0–17.0)
Immature Granulocytes: 1 %
Lymphocytes Relative: 5 %
Lymphs Abs: 0.4 10*3/uL — ABNORMAL LOW (ref 0.7–4.0)
MCH: 25 pg — ABNORMAL LOW (ref 26.0–34.0)
MCHC: 31.6 g/dL (ref 30.0–36.0)
MCV: 79 fL — ABNORMAL LOW (ref 80.0–100.0)
Monocytes Absolute: 0.3 10*3/uL (ref 0.1–1.0)
Monocytes Relative: 4 %
Neutro Abs: 8.9 10*3/uL — ABNORMAL HIGH (ref 1.7–7.7)
Neutrophils Relative %: 90 %
Platelets: 370 10*3/uL (ref 150–400)
RBC: 5.05 MIL/uL (ref 4.22–5.81)
RDW: 14.8 % (ref 11.5–15.5)
WBC: 9.8 10*3/uL (ref 4.0–10.5)
nRBC: 0.2 % (ref 0.0–0.2)

## 2019-10-04 LAB — GLUCOSE, CAPILLARY
Glucose-Capillary: 229 mg/dL — ABNORMAL HIGH (ref 70–99)
Glucose-Capillary: 251 mg/dL — ABNORMAL HIGH (ref 70–99)
Glucose-Capillary: 302 mg/dL — ABNORMAL HIGH (ref 70–99)
Glucose-Capillary: 326 mg/dL — ABNORMAL HIGH (ref 70–99)
Glucose-Capillary: 371 mg/dL — ABNORMAL HIGH (ref 70–99)
Glucose-Capillary: 443 mg/dL — ABNORMAL HIGH (ref 70–99)

## 2019-10-04 LAB — FERRITIN
Ferritin: 444 ng/mL — ABNORMAL HIGH (ref 24–336)
Ferritin: 470 ng/mL — ABNORMAL HIGH (ref 24–336)

## 2019-10-04 LAB — D-DIMER, QUANTITATIVE: D-Dimer, Quant: 1.21 ug/mL-FEU — ABNORMAL HIGH (ref 0.00–0.50)

## 2019-10-04 LAB — PHOSPHORUS: Phosphorus: 4 mg/dL (ref 2.5–4.6)

## 2019-10-04 LAB — HIV ANTIBODY (ROUTINE TESTING W REFLEX): HIV Screen 4th Generation wRfx: NONREACTIVE — AB

## 2019-10-04 LAB — C-REACTIVE PROTEIN: CRP: 16.1 mg/dL — ABNORMAL HIGH (ref <1.0)

## 2019-10-04 LAB — ABO/RH: ABO/RH(D): B POS

## 2019-10-04 LAB — ECHOCARDIOGRAM COMPLETE
Height: 67 in
Weight: 3600 oz

## 2019-10-04 MED ORDER — DEXAMETHASONE SODIUM PHOSPHATE 10 MG/ML IJ SOLN
6.0000 mg | Freq: Two times a day (BID) | INTRAMUSCULAR | Status: DC
  Administered 2019-10-04 – 2019-10-08 (×9): 6 mg via INTRAVENOUS
  Filled 2019-10-04 (×9): qty 1

## 2019-10-04 MED ORDER — LIP MEDEX EX OINT
TOPICAL_OINTMENT | CUTANEOUS | Status: DC | PRN
  Filled 2019-10-04: qty 7

## 2019-10-04 MED ORDER — INSULIN ASPART 100 UNIT/ML ~~LOC~~ SOLN: 3.0000 [IU] | Freq: Three times a day (TID) | SUBCUTANEOUS | Status: DC

## 2019-10-04 MED ORDER — INSULIN ASPART 100 UNIT/ML ~~LOC~~ SOLN
0.0000 [IU] | SUBCUTANEOUS | Status: DC
  Administered 2019-10-04: 16:00:00 15 [IU] via SUBCUTANEOUS
  Administered 2019-10-04: 20 [IU] via SUBCUTANEOUS
  Administered 2019-10-04: 15 [IU] via SUBCUTANEOUS
  Administered 2019-10-05: 7 [IU] via SUBCUTANEOUS
  Administered 2019-10-05: 11:00:00 20 [IU] via SUBCUTANEOUS
  Administered 2019-10-05: 23:00:00 7 [IU] via SUBCUTANEOUS
  Administered 2019-10-05: 15 [IU] via SUBCUTANEOUS
  Administered 2019-10-06: 11 [IU] via SUBCUTANEOUS
  Administered 2019-10-06: 09:00:00 4 [IU] via SUBCUTANEOUS
  Administered 2019-10-06: 15 [IU] via SUBCUTANEOUS
  Administered 2019-10-06: 12:00:00 20 [IU] via SUBCUTANEOUS
  Administered 2019-10-07: 15 [IU] via SUBCUTANEOUS
  Administered 2019-10-07: 4 [IU] via SUBCUTANEOUS

## 2019-10-04 MED ORDER — TOCILIZUMAB 400 MG/20ML IV SOLN
800.0000 mg | Freq: Once | INTRAVENOUS | Status: AC
  Administered 2019-10-04: 11:00:00 800 mg via INTRAVENOUS
  Filled 2019-10-04: qty 40

## 2019-10-04 MED ORDER — ENOXAPARIN SODIUM 60 MG/0.6ML ~~LOC~~ SOLN: 0.5000 mg/kg | SUBCUTANEOUS | Status: DC

## 2019-10-04 MED ORDER — SODIUM CHLORIDE 0.9% IV SOLUTION
Freq: Once | INTRAVENOUS | Status: AC
  Administered 2019-10-04: 12:00:00 via INTRAVENOUS

## 2019-10-04 MED ORDER — INSULIN ASPART 100 UNIT/ML ~~LOC~~ SOLN
0.0000 [IU] | SUBCUTANEOUS | Status: DC
  Administered 2019-10-04: 3 [IU] via SUBCUTANEOUS
  Administered 2019-10-04: 5 [IU] via SUBCUTANEOUS
  Administered 2019-10-04: 9 [IU] via SUBCUTANEOUS

## 2019-10-04 MED ORDER — INSULIN ASPART 100 UNIT/ML ~~LOC~~ SOLN: 0.0000 [IU] | Freq: Every day | SUBCUTANEOUS | Status: DC

## 2019-10-04 MED ORDER — INSULIN DETEMIR 100 UNIT/ML ~~LOC~~ SOLN
10.0000 [IU] | Freq: Once | SUBCUTANEOUS | Status: AC
  Administered 2019-10-04: 10 [IU] via SUBCUTANEOUS
  Filled 2019-10-04: qty 0.1

## 2019-10-04 MED ORDER — ENOXAPARIN SODIUM 60 MG/0.6ML ~~LOC~~ SOLN
0.5000 mg/kg | Freq: Two times a day (BID) | SUBCUTANEOUS | Status: DC
  Administered 2019-10-04 – 2019-10-08 (×8): 50 mg via SUBCUTANEOUS
  Filled 2019-10-04 (×8): qty 0.6

## 2019-10-04 MED ORDER — FUROSEMIDE 10 MG/ML IJ SOLN
80.0000 mg | Freq: Once | INTRAMUSCULAR | Status: AC
  Administered 2019-10-04: 11:00:00 80 mg via INTRAVENOUS
  Filled 2019-10-04: qty 8

## 2019-10-04 MED ORDER — INSULIN DETEMIR 100 UNIT/ML ~~LOC~~ SOLN
15.0000 [IU] | Freq: Two times a day (BID) | SUBCUTANEOUS | Status: DC
  Administered 2019-10-04 – 2019-10-07 (×6): 15 [IU] via SUBCUTANEOUS
  Filled 2019-10-04 (×7): qty 0.15

## 2019-10-04 MED ORDER — TOCILIZUMAB 400 MG/20ML IV SOLN
8.0000 mg/kg | Freq: Once | INTRAVENOUS | Status: DC
  Filled 2019-10-04: qty 40.8

## 2019-10-04 MED ORDER — CHLORHEXIDINE GLUCONATE CLOTH 2 % EX PADS
6.0000 | MEDICATED_PAD | Freq: Every day | CUTANEOUS | Status: DC
  Administered 2019-10-04 – 2019-10-28 (×21): 6 via TOPICAL

## 2019-10-04 MED ORDER — IPRATROPIUM-ALBUTEROL 20-100 MCG/ACT IN AERS
2.0000 | INHALATION_SPRAY | Freq: Four times a day (QID) | RESPIRATORY_TRACT | Status: DC
  Administered 2019-10-04 – 2019-10-20 (×63): 2 via RESPIRATORY_TRACT
  Filled 2019-10-04 (×3): qty 4

## 2019-10-04 MED ORDER — INSULIN ASPART 100 UNIT/ML ~~LOC~~ SOLN: 0.0000 [IU] | Freq: Three times a day (TID) | SUBCUTANEOUS | Status: DC

## 2019-10-04 NOTE — Progress Notes (Signed)
Venous duplex lower ext  has been completed. Refer to [facility] under chart review to view preliminary results.   10/04/2019  3:27 PM Betzy Barbier, Bonnye Fava

## 2019-10-04 NOTE — Progress Notes (Signed)
  Echocardiogram 2D Echocardiogram has been performed.  William [name] 10/04/2019, 1:43 PM

## 2019-10-04 NOTE — Progress Notes (Signed)
   10/04/19 0724  Family/Significant Other Communication  Family/Significant Other Update Called;Updated (spouse)   Spouse called and updated regarding pt transfer to ICU. Increased O2 needs. 15L HFNC + 15L NRB. O2 sats 89-90%. Started actemra. 80mg  IV lasix.

## 2019-10-04 NOTE — Consult Note (Signed)
NAME:  William [name], MRN:  [id number], DOB:  1956-03-20, LOS: 1 ADMISSION DATE:  09/29/2019, CONSULTATION DATE:  10/04/2019 REFERRING MD:  Nile Riggs [place] Pink, CHIEF COMPLAINT:  Acute hypoxemic respiratory failure due to COVID-19   Brief History   63 year old male with PMH of a DVT that is now off anti-coagulation who presents to PCCM with evolving hypoxemic respiratory failure due to COVID-19.  Patient was admitted to the hospital for increased O2 demand and is now being transferred to the ICU for worsening hypoxemia requiring heated high flow O2.  PCCM was consulted for evolving respiratory failure due to hypoxemia from COVID-19.  History of present illness   63 year old male with PMH of a DVT that is now off anti-coagulation who presents to PCCM with evolving hypoxemic respiratory failure due to COVID-19.  Patient was admitted to the hospital for increased O2 demand and is now being transferred to the ICU for worsening hypoxemia requiring heated high flow O2.  PCCM was consulted for evolving respiratory failure due to hypoxemia from COVID-19.  Past Medical History  DVT HTN [place] [facility] Events   11/17 transfer to the ICU for heated high flow  Consults:  PCCM  Procedures:  N/A  Significant Diagnostic Tests:  CXR that I reviewed myself with lower lobes infiltrate noted  Micro Data:  COVID 19 positive  Antimicrobials:  Remdesivir  11/16>>>  Interim history/subjective:  Worsening hypoxemia overnight, transfer to the ICU for heated high flow  Objective   Blood pressure 132/68, pulse 82, temperature 99.2 F (37.3 C), temperature source Oral, resp. rate (!) 22, height 5\' 7"  (1.702 m), weight 102.1 kg, SpO2 96 %.    FiO2 (%):  [100 %] 100 %   Intake/Output Summary (Last 24 hours) at 10/04/2019 1112 Last data filed at 10/04/2019 1036 Gross per 24 hour  Intake 1040.21 ml  Output 2350 ml  Net -1309.79 ml   Filed Weights   10/16/2019 1347  Weight: 102.1 kg    Examination: General: Acutely ill appearing male, mild respiratory distress HENT: [place]/AT, PERRL, EOM-I and MMM, HFNC on Lungs: Diminished diffusely Cardiovascular: RRR, Nl S1/S2 and -M/R/G Abdomen: Soft, NT, ND and +BS Extremities: -edema and -tenderness Neuro: Alert and interactive, moving all ext to command Skin: Intact  I reviewed CXR myself, bibasilar infiltrate noted  Resolved Hospital Problem list   N/A  Assessment & Plan:  63 year old male with COVID-19 PNA induced hypoxemic respiratory failure  Acute hypoxemic respiratory failure: - Titrate O2 for sat of 85-88% - Remdisivir as ordered - Decadron as ordered - CXR to PRN - No need for further ABG - Maintain as dry as able - KVO IVF  CAD: - Lopressor - ASA - Brilinta  HLD: - Lipitor  PCCM will follow  Labs   CBC: Recent Labs  Lab 09/19/2019 1809 10/05/2019 1340 10/04/19 0600  WBC 5.2 9.6 9.8  NEUTROABS 4.5 8.2* 8.9*  HGB 12.1* 13.0 12.6*  HCT 37.3* 40.9 39.9  MCV 77.7* 79.9* 79.0*  PLT 257 276 0000000    Basic Metabolic Panel: Recent Labs  Lab 09/25/2019 1809 09/21/2019 1340 10/04/19 0600  NA 137 136 147*  K 4.6 4.6 5.0  CL 103 104 111  CO2 21* 17* 23  GLUCOSE 200* 314* 229*  BUN 25* 38* 44*  CREATININE 1.87* 1.75* 1.62*  CALCIUM 7.8* 7.7* 8.1*  MG  --   --  3.7*  PHOS  --   --  4.0  GFR: Estimated Creatinine Clearance: 53.8 mL/min (A) (by C-G formula based on SCr of 1.62 mg/dL (H)). Recent Labs  Lab 09/30/2019 1809 10/10/2019 1340 10/16/2019 1645 10/04/19 0600  PROCALCITON  --  0.32  --   --   WBC 5.2 9.6  --  9.8  LATICACIDVEN  --  3.6* 1.8  --     Liver Function Tests: Recent Labs  Lab 10/08/2019 1809 10/01/2019 1340 10/04/19 0600  AST 29 32 26  ALT 19 21 17   ALKPHOS 32* 38 44  BILITOT 0.7 0.5 0.4  PROT 7.2 7.5 7.3  ALBUMIN 3.0* 3.2* 3.0*   No results for input(s): LIPASE, AMYLASE in the last 168 hours. No results for input(s): AMMONIA in the last 168 hours.  ABG No results found  for: PHART, PCO2ART, PO2ART, HCO3, TCO2, ACIDBASEDEF, O2SAT   Coagulation Profile: No results for input(s): INR, PROTIME in the last 168 hours.  Cardiac Enzymes: No results for input(s): CKTOTAL, CKMB, CKMBINDEX, TROPONINI in the last 168 hours.  HbA1C: Hgb A1c MFr Bld  Date/Time Value Ref Range Status  10/17/2019 01:40 PM 8.1 (H) 4.8 - 5.6 % Final    Comment:    REPEATED TO VERIFY (NOTE) Pre diabetes:          5.7%-6.4% Diabetes:              >6.4% Glycemic control for   <7.0% adults with diabetes   01/09/2014 05:10 AM 8.9 (H) <5.7 % Final    Comment:    (NOTE)                                                                       According to the ADA Clinical Practice Recommendations for 2011, when HbA1c is used as a screening test:  >=6.5%   Diagnostic of Diabetes Mellitus           (if abnormal result is confirmed) 5.7-6.4%   Increased risk of developing Diabetes Mellitus References:Diagnosis and Classification of Diabetes Mellitus,Diabetes [id number] 1):S62-S69 and Standards of Medical Care in         Diabetes - 2011,Diabetes [id number] (Suppl 1):S11-S61.    CBG: Recent Labs  Lab 10/03/19 2214 10/04/19 0349 10/04/19 0740  GLUCAP 314* 251* 229*    Review of Systems:   12 point ROS is negative other than above  Past Medical History  He,  has a past medical history of CAD (coronary artery disease), Diabetes mellitus ([place]) (2015), H/O non-ST elevation myocardial infarction (NSTEMI) (12/2013), echocardiogram, and Hypertension.   Surgical History    Past Surgical History:  Procedure Laterality Date  . CORONARY ANGIOPLASTY WITH STENT PLACEMENT  Feb 2015   proxLAD - 3.0x16DES, midLAD -3.0x12 DES, CFX - 2.5x16 DES  . LEFT HEART CATHETERIZATION WITH CORONARY ANGIOGRAM N/A 01/09/2014   Procedure: LEFT HEART CATHETERIZATION WITH CORONARY ANGIOGRAM;  Surgeon: Jettie Booze, MD;  Location: [facility] CATH LAB;  Service: Cardiovascular;  Laterality: N/A;      Social History   reports that he has never smoked. He has never used smokeless tobacco. He reports current alcohol use.   Family History   His family history is not on file.   Allergies Allergies  Allergen Reactions  . Shellfish Allergy Hives  Home Medications  Prior to Admission medications   Medication Sig Start Date End Date Taking? Authorizing Provider  acetaminophen (TYLENOL) 500 MG tablet Take 1,000 mg by mouth every 6 (six) hours as needed for headache (pain).   Yes [provider]  amLODIPine-Valsartan-HCTZ (EXFORGE HCT) 5-160-12.5 MG TABS Take 1 tablet by mouth daily.   Yes [provider]  aspirin EC 81 MG tablet Take 1 tablet (81 mg total) by mouth daily. 01/10/14  Yes Barrett, Evelene Croon, PA-C  BRILINTA 90 MG TABS tablet take 1 tablet by mouth twice a day Patient taking differently: Take 90 mg by mouth daily.  07/24/15  Yes Jerline Pain, MD  Chlorphen-PE-Acetaminophen (NOREL AD PO) Take 1 tablet by mouth every 6 (six) hours as needed (congestion, cough).   Yes [provider]  doxazosin (CARDURA) 4 MG tablet Take 4 mg by mouth at bedtime.    Yes [provider]  empagliflozin (JARDIANCE) 25 MG TABS tablet Take 25 mg by mouth daily.   Yes [provider]  linaGLIPtin-metFORMIN HCl (JENTADUETO) 2.03-999 MG TABS Take 2 tablets by mouth daily.   Yes [provider]  spironolactone (ALDACTONE) 25 MG tablet take 1 tablet by mouth once daily Patient taking differently: Take 25 mg by mouth daily.  04/09/15  Yes Jerline Pain, MD  Tetrahydrozoline HCl (VISINE OP) Place 1 drop into both eyes daily as needed (dry eyes/irritation).   Yes [provider]  atorvastatin (LIPITOR) 40 MG tablet take 1 tablet by mouth once daily AT 6PM Patient not taking: Reported on 09/21/2019 06/04/16   Jerline Pain, MD  metoprolol tartrate (LOPRESSOR) 25 MG tablet Take 1 tablet (25 mg total) by mouth 2 (two) times daily. Patient not taking:  Reported on 10/08/2019 06/04/16   Jerline Pain, MD  nitroGLYCERIN (NITROSTAT) 0.4 MG SL tablet Place 1 tablet (0.4 mg total) under the tongue every 5 (five) minutes as needed for chest pain. Patient not taking: Reported on 10/12/2019 01/10/14   Barrett, Evelene Croon, PA-C    The patient is critically ill with multiple organ systems failure and requires high complexity decision making for assessment and support, frequent evaluation and titration of therapies, application of advanced monitoring technologies and extensive interpretation of multiple databases.   Critical Care Time devoted to patient care services described in this note is  35  Minutes. This time reflects time of care of this signee Dr Jennet Maduro. This critical care time does not reflect procedure time, or teaching time or supervisory time of PA/NP/Med student/Med Resident etc but could involve care discussion time.  Rush Farmer, M.D. [facility] Pulmonary/Critical Care Medicine.

## 2019-10-04 NOTE — Plan of Care (Signed)
  Problem: Education: Goal: Knowledge of risk factors and measures for prevention of condition will improve Outcome: Progressing   Problem: Safety: Goal: Ability to remain free from injury will improve Outcome: Progressing   

## 2019-10-04 NOTE — Progress Notes (Signed)
Results for DELRON, TAIWO (MRN [id number]) as of 10/04/2019 12:45  Ref. Range 09/25/2019 22:14 10/04/2019 03:49 10/04/2019 07:40 10/04/2019 12:24  Glucose-Capillary Latest Ref Range: 70 - 99 mg/dL 314 (H) 251 (H) 229 (H) 371 (H)  Noted that blood sugars continue to be greater than 180 mg/dl.  Recommend increasing Levemir to 10 units BID, increase Novolog correction scale to MODERATE every 4 hours if NPO and TID & HS if eating. Titrate dosages as needed.   Harvel Ricks RN BSN CDE Diabetes Coordinator Pager: [phone]  8am-5pm

## 2019-10-04 NOTE — Progress Notes (Signed)
   10/11/2019 2152  Provider Notification  Provider Name/Title Shanon Brow  Date Provider Notified 09/30/2019  Time Provider Notified 2200  Notification Type Page  Notification Reason Other (Comment) (increase in 02 demand)  Response No new orders  pt on 15L NRB on arrival to unit sp02 low 80%s. With minimal exertion edge of bed, pt sp02 60%s on NRB, requiring additional 15L HFNC to maintain sats upper 80%s. No acute respiratory distress. Pt states respiratory effort remains the same. RT notified and in to see patient. Will continue to monitor.

## 2019-10-04 NOTE — Progress Notes (Signed)
Pt wife [name] called and updated on pt condition, POC, medications. All questions answered, pt wife thankful. Pt remains a&ox4, no acute distress at this time. 92% on 15L NRB and HFNC

## 2019-10-04 NOTE — Progress Notes (Addendum)
PROGRESS NOTE  William [name] [id number] DOB: 1956/01/11 DOA: 10/17/2019  PCP: [name], MD  Brief History/Interval Summary: 63 year old African-American male with a past medical history of essential hypertension, hyperlipidemia, coronary artery disease, diabetes mellitus type 2, remote history of DVT and recent diagnosis of COVID-19 who presented with shortness of breath and cough.  Patient was noted to be hypoxic.  He was hospitalized for further management.  Reason for Visit: Acute respiratory failure with hypoxia.  Pneumonia due to COVID-19.  Consultants: Pulmonology  Procedures: Transthoracic echocardiogram and lower extremity Doppler studies have been ordered.  Antibiotics: Anti-infectives (From admission, onward)   Start     Dose/Rate Route Frequency Ordered Stop   10/04/19 1600  remdesivir 100 mg in sodium chloride 0.9 % 250 mL IVPB     100 mg 500 mL/hr over 30 Minutes Intravenous Daily 10/01/2019 2358 10/07/19 0959   10/01/2019 2200  remdesivir 100 mg in sodium chloride 0.9 % 250 mL IVPB  Status:  Discontinued     100 mg 500 mL/hr over 30 Minutes Intravenous Every 24 hours 10/14/2019 1537 09/30/2019 2358      Subjective/Interval History: Overnight events noted.  Patient became progressively more hypoxic.  He is currently on high flow nasal cannula as well as nonrebreather.  He mentioned that he is feeling short of breath even with minimal exertion.  Denies any chest pain.  Actemra and convalescent plasma was discussed with the patient.  See below.    Assessment/Plan:  Acute Hypoxic Resp. Failure/Pneumonia due to COVID-19  COVID-19 Labs  Recent Labs    09/18/2019 1340 10/14/2019 1700 10/04/19 0600  DDIMER 1.42*  --  1.21*  FERRITIN  --  444* 470*  LDH 445*  --   --   CRP 18.0*  --  16.1*    Lab Results  Component Value Date   SARSCOV2NAA POSITIVE (A) 10/04/2019     Fever: Low-grade fever noted Oxygen requirements: Patient was saturating in the mid 80s  on HFNC 8+ nonrebreather on the floor.  Transferred to the ICU and currently on heated high flow at 30 L/min.  Of 100% FiO2.  Saturating in the 90s. Antibacterials: He was given ceftriaxone and azithromycin in the ED but not continued Remdesivir: Day 3 today Steroids: Dexamethasone 6 mg IV daily.  Will increase to twice a day. Diuretics: 80 mg of Lasix ordered IV x1. Actemra: 1 dose given today 11/17 Vitamin C and Zinc: Continue DVT Prophylaxis: He is on the COVID-19 ICU protocol dose of Lovenox  Patient with a very tenuous respiratory status when I saw him this morning.  He had to be transferred to the intensive care unit for heated high flow.  Pulmonology has been consulted.  Continue incentive spirometry, prone positioning.  Mobilization as much as possible.  He remains on remdesivir.  His dose of steroids will be increased.  CRP was 18 yesterday.  16.1 today.  D-dimer 1.21.  Ferritin 470.  Proceed with lower extremity Dopplers.  Cannot do CT angiogram due to renal failure.  He does mention a remote history of DVT.  Currently on higher dose of Lovenox but not therapeutic.  The treatment plan and use of medications and known side effects were discussed with patient and his wife. Some of the medications used are based on case reports/anecdotal data.  Steroids have shown some benefit in treatment of COVID-19 based on at least one study.  Another medication used is Actemra (Tocilizumab). However randomized control trials involving this drug have not  shown any benefit although the final reports have not been published yet.  Complete risks and long-term side effects are unknown, however in the best clinical judgment they seem to be of some benefit.  Despite lack of benefit noted on preliminary reports from RCT's patient does want this medication knowing that this is considered experimental treatment.  Actemra has been ordered.  Convalescent plasma was also discussed with the patient.  He was given the  consent form and fact sheet for the same.  He is read the consent form.  He was given an opportunity to have his questions answered.  He has signed the form.  I have signed it.  The hardcopy has been placed in his shadow chart.  Convalescent plasma has been ordered.  History of coronary artery disease status post PCI previously Seems to be stable from a cardiac standpoint.  He is on aspirin and Brilinta.  Unclear if the patient has followed up with cardiology recently.  These can be addressed in the outpatient setting.  Continue home medications for now.  Continue statin and beta-blockers.  Echocardiogram has been ordered.  Acute kidney injury Unclear if the patient has chronic kidney disease.  His renal function was normal in 2015.  To be 1.87 at the time of admission.  Patient was given IV fluids with slight improvement in creatinine.  Given Lasix today due to his worsening respiratory status.  Monitor urine output.  Recheck labs tomorrow.  Avoid nephrotoxic agents.  Diabetes mellitus type 2, uncontrolled with hyperglycemia HbA1c 8.1.  Elevated CBGs due to steroids.  He is currently on Levemir twice a day as well as SSI.  Will go up on the dose of Levemir.  Remote history of DVT Unable to give any further details.  Not noted to be on anticoagulation.  Lower extremity Doppler studies ordered.  Essential hypertension Continue with the metoprolol.  Monitor blood pressures closely.  Hyperlipidemia Continue statin.  LFTs are normal.  Mild hyponatremia Recheck labs tomorrow.  Normocytic anemia Monitor hemoglobin closely.  Obesity Estimated body mass index is 35.24 kg/m as calculated from the following:   Height as of this encounter: 5\' 7"  (1.702 m).   Weight as of this encounter: 102.1 kg.  ADDENDUM Patient reevaluated in the afternoon.  He is much more comfortable.  He is on 30 L heated high flow, 100% FiO2 saturating in the early 90s.  He states that he is feeling better.  Denies any  chest pain.  According to nurse he has put out about 4 L of urine after he received Lasix this morning.  We will update his wife as well.  Yet to receive convalescent plasma.  It was ordered this morning.  Continue ICU monitoring for now.  DVT Prophylaxis: On ICU protocol dose of Lovenox PUD Prophylaxis: Pepcid Code Status: Full code Family Communication: Discussed with the patient and his wife Disposition Plan: Transferred to the ICU.   Medications:  Scheduled: . sodium chloride   Intravenous Once  . aspirin EC  81 mg Oral Daily  . atorvastatin  40 mg Oral q1800  . Chlorhexidine Gluconate Cloth  6 each Topical Daily  . dexamethasone (DECADRON) injection  6 mg Intravenous Q24H  . enoxaparin (LOVENOX) injection  0.5 mg/kg Subcutaneous Q12H  . insulin aspart  0-9 Units Subcutaneous Q4H  . insulin detemir  5 Units Subcutaneous BID  . Ipratropium-Albuterol  2 puff Inhalation Q6H  . metoprolol tartrate  12.5 mg Oral BID  . multivitamin with minerals  1 tablet Oral Daily  . ticagrelor  90 mg Oral BID  . vitamin B-12  100 mcg Oral Daily  . vitamin C  500 mg Oral Daily  . zinc sulfate  220 mg Oral Daily   Continuous: . remdesivir 100 mg in NS 250 mL     [id number], lip balm, nitroGLYCERIN, ondansetron **OR** ondansetron (ZOFRAN) IV, tetrahydrozoline   Objective:  Vital Signs  Vitals:   10/04/19 0400 10/04/19 0724 10/04/19 1107 10/04/19 1200  BP: 133/69 132/68  (!) 144/65  Pulse: 73 76 82 85  Resp: (!) 33 (!) 25 (!) 22 (!) 26  Temp:  99.2 F (37.3 C)  100 F (37.8 C)  TempSrc:  Oral  Core  SpO2: 90% 93% 96% 98%  Weight:      Height:        Intake/Output Summary (Last 24 hours) at 10/04/2019 1333 Last data filed at 10/04/2019 1100 Gross per 24 hour  Intake 1280.21 ml  Output 3250 ml  Net -1969.79 ml   Filed Weights   10/02/2019 1347  Weight: 102.1 kg    General appearance: Awake alert.  In no distress.  In some discomfort. Resp: Noted to be tachypneic.  Some  use of accessory muscles noted at times.  Crackles at the bases.  No wheezing or rhonchi. Cardio: S1-S2 is normal regular.  No S3-S4.  No rubs murmurs or bruit GI: Abdomen is soft.  Nontender nondistended.  Bowel sounds are present normal.  No masses organomegaly Extremities: No edema.  Full range of motion of lower extremities. Neurologic: Alert and oriented x3.  No focal neurological deficits.    Lab Results:  Data Reviewed: I have personally reviewed following labs and imaging studies  CBC: Recent Labs  Lab 09/26/2019 1809 09/21/2019 1340 10/04/19 0600  WBC 5.2 9.6 9.8  NEUTROABS 4.5 8.2* 8.9*  HGB 12.1* 13.0 12.6*  HCT 37.3* 40.9 39.9  MCV 77.7* 79.9* 79.0*  PLT 257 276 0000000    Basic Metabolic Panel: Recent Labs  Lab 09/23/2019 1809 09/18/2019 1340 10/04/19 0600  NA 137 136 147*  K 4.6 4.6 5.0  CL 103 104 111  CO2 21* 17* 23  GLUCOSE 200* 314* 229*  BUN 25* 38* 44*  CREATININE 1.87* 1.75* 1.62*  CALCIUM 7.8* 7.7* 8.1*  MG  --   --  3.7*  PHOS  --   --  4.0    GFR: Estimated Creatinine Clearance: 53.8 mL/min (A) (by C-G formula based on SCr of 1.62 mg/dL (H)).  Liver Function Tests: Recent Labs  Lab 09/21/2019 1809 09/24/2019 1340 10/04/19 0600  AST 29 32 26  ALT 19 21 17   ALKPHOS 32* 38 44  BILITOT 0.7 0.5 0.4  PROT 7.2 7.5 7.3  ALBUMIN 3.0* 3.2* 3.0*     HbA1C: Recent Labs    10/12/2019 1340  HGBA1C 8.1*    CBG: Recent Labs  Lab 09/27/2019 2214 10/04/19 0349 10/04/19 0740 10/04/19 1224  GLUCAP 314* 251* 229* 371*    Anemia Panel: Recent Labs    10/15/2019 1700 10/04/19 0600  FERRITIN 444* 470*    Recent Results (from the past 240 hour(s))  SARS CORONAVIRUS 2 (TAT 6-24 HRS) Nasopharyngeal Nasopharyngeal Swab     Status: Abnormal   Collection Time: 09/19/2019  6:09 PM   Specimen: Nasopharyngeal Swab  Result Value Ref Range Status   SARS Coronavirus 2 POSITIVE (A) NEGATIVE Final    Comment: RESULT CALLED TO, READ BACK BY AND VERIFIED WITH:  Herby Abraham,  RN AT [id number] ON 09/20/2019 BY SAINVILUS S (NOTE) SARS-CoV-2 target nucleic acids are DETECTED. The SARS-CoV-2 RNA is generally detectable in upper and lower respiratory specimens during the acute phase of infection. Positive results are indicative of active infection with SARS-CoV-2. Clinical  correlation with patient history and other diagnostic information is necessary to determine patient infection status. Positive results do  not rule out bacterial infection or co-infection with other viruses. The expected result is Negative. Fact Sheet for Patients: SugarRoll.be Fact Sheet for Healthcare Providers: https://www.woods-mathews.com/ This test is not yet approved or cleared by the [place] FDA and  has been authorized for detection and/or diagnosis of SARS-CoV-2 by FDA under an Emergency Use Authorization (EUA). This EUA will remain  in effect (meaning this test can  be used) for the duration of the COVID-19 declaration under Section 564(b)(1) of the Act, 21 U.S.C. section 360bbb-3(b)(1), unless the authorization is terminated or revoked sooner. Performed at [facility], [place] [address] [place], [place] [address]   Blood culture (routine x 2)     Status: None (Preliminary result)   Collection Time: 09/23/2019  1:40 PM   Specimen: BLOOD  Result Value Ref Range Status   Specimen Description   Final    BLOOD RIGHT ANTECUBITAL Performed at [facility], [place] [address], [place], [place] [address]    Special Requests   Final    BOTTLES DRAWN AEROBIC AND ANAEROBIC Blood Culture adequate volume Performed at [facility] [address], [place], [place] [address]    Culture   Final    NO GROWTH < 24 HOURS Performed at [facility] [address] [place], [place] [address]    Report Status PENDING  Incomplete  Blood culture (routine x 2)     Status: None (Preliminary result)   Collection  Time: 10/14/2019  1:45 PM   Specimen: BLOOD RIGHT WRIST  Result Value Ref Range Status   Specimen Description   Final    BLOOD RIGHT WRIST Performed at [facility] [address], [place], [place] [address]    Special Requests   Final    BOTTLES DRAWN AEROBIC AND ANAEROBIC Blood Culture adequate volume Performed at [facility] [address] [place], [place] [address]    Culture   Final    NO GROWTH < 24 HOURS Performed at [facility] [address] [place], [place] [address]    Report Status PENDING  Incomplete      Radiology Studies: Dg Chest Port 1 View  Result Date: 10/04/2019 CLINICAL DATA:  Hypoxia EXAM: PORTABLE CHEST 1 VIEW COMPARISON:  09/21/2019 FINDINGS: Interstitial/patchy opacities in the bilateral upper and lower lobes, unchanged. No definite pleural effusions. No pneumothorax. The heart is top-normal in size. IMPRESSION: Multifocal pneumonia in this patient with known COVID, unchanged. Electronically Signed   By: Julian Hy M.D.   On: 10/04/2019 10:07   Dg Chest Portable 1 View  Result Date: 10/08/2019 CLINICAL DATA:  Hypoxia, shortness of breath. EXAM: PORTABLE CHEST 1 VIEW COMPARISON:  October 02, 2019. FINDINGS: The heart size and mediastinal contours are within normal limits. No pneumothorax or pleural effusion is noted. Stable left perihilar and lingular opacity is noted as well as mild right upper lobe opacity consistent with multifocal pneumonia. The visualized skeletal structures are unremarkable. IMPRESSION: Stable bilateral lung opacities are noted, left greater than right, consistent with multifocal pneumonia. Electronically Signed   By: Jeneen Rinks  Murlean Caller M.D.   On: 09/21/2019 14:14   Dg Chest Port 1 View  Result Date: 10/07/2019 CLINICAL DATA:  Pt complains of chest pain x 3 days, non-radiating, worse with inspiration, cough, fever, and generally feeling unwell. Pt was unable to hold deep inspiration  for x-ray due to coughing. EXAM: PORTABLE CHEST 1 VIEW COMPARISON:  01/07/2014 FINDINGS: Hazy interstitial and airspace opacities present in the left perihilar region, right upper lobe, and left lower lobe. Low lung volumes are present, causing crowding of the pulmonary vasculature. No blunting of the costophrenic angles. Cardiac and mediastinal margins appear normal. IMPRESSION: 1. Hazy interstitial and airspace opacities in the left perihilar region, right upper lobe, and left lower lobe, suspicious for multilobar pneumonia or atypical pneumonia. 2. Low lung volumes. Electronically Signed   By: Van Clines M.D.   On: 09/30/2019 18:57       LOS: 1 day   [facility] Pager on [email or web address]  10/04/2019, 1:33 PM

## 2019-10-05 DIAGNOSIS — R0902 Hypoxemia: Secondary | ICD-10-CM

## 2019-10-05 DIAGNOSIS — I25119 Atherosclerotic heart disease of native coronary artery with unspecified angina pectoris: Secondary | ICD-10-CM

## 2019-10-05 LAB — MAGNESIUM: Magnesium: 3.7 mg/dL — ABNORMAL HIGH (ref 1.7–2.4)

## 2019-10-05 LAB — CBC WITH DIFFERENTIAL/PLATELET
Abs Immature Granulocytes: 0.1 10*3/uL — ABNORMAL HIGH (ref 0.00–0.07)
Basophils Absolute: 0 10*3/uL (ref 0.0–0.1)
Basophils Relative: 0 %
Eosinophils Absolute: 0 10*3/uL (ref 0.0–0.5)
Eosinophils Relative: 0 %
HCT: 39.4 % (ref 39.0–52.0)
Hemoglobin: 12.7 g/dL — ABNORMAL LOW (ref 13.0–17.0)
Immature Granulocytes: 1 %
Lymphocytes Relative: 7 %
Lymphs Abs: 0.8 10*3/uL (ref 0.7–4.0)
MCH: 25 pg — ABNORMAL LOW (ref 26.0–34.0)
MCHC: 32.2 g/dL (ref 30.0–36.0)
MCV: 77.7 fL — ABNORMAL LOW (ref 80.0–100.0)
Monocytes Absolute: 0.7 10*3/uL (ref 0.1–1.0)
Monocytes Relative: 6 %
Neutro Abs: 9.1 10*3/uL — ABNORMAL HIGH (ref 1.7–7.7)
Neutrophils Relative %: 86 %
Platelets: 408 10*3/uL — ABNORMAL HIGH (ref 150–400)
RBC: 5.07 MIL/uL (ref 4.22–5.81)
RDW: 14.6 % (ref 11.5–15.5)
WBC: 10.6 10*3/uL — ABNORMAL HIGH (ref 4.0–10.5)
nRBC: 0.7 % — ABNORMAL HIGH (ref 0.0–0.2)

## 2019-10-05 LAB — BPAM FFP
Blood Product Expiration Date: 202011181318
ISSUE DATE / TIME: 202011171858
Unit Type and Rh: 7300

## 2019-10-05 LAB — FERRITIN: Ferritin: 434 ng/mL — ABNORMAL HIGH (ref 24–336)

## 2019-10-05 LAB — PREPARE FRESH FROZEN PLASMA

## 2019-10-05 LAB — GLUCOSE, CAPILLARY
Glucose-Capillary: 145 mg/dL — ABNORMAL HIGH (ref 70–99)
Glucose-Capillary: 93 mg/dL (ref 70–99)
Glucose-Capillary: 119 mg/dL — ABNORMAL HIGH (ref 70–99)
Glucose-Capillary: 363 mg/dL — ABNORMAL HIGH (ref 70–99)
Glucose-Capillary: 215 mg/dL — ABNORMAL HIGH (ref 70–99)
Glucose-Capillary: 322 mg/dL — ABNORMAL HIGH (ref 70–99)
Glucose-Capillary: 231 mg/dL — ABNORMAL HIGH (ref 70–99)

## 2019-10-05 LAB — COMPREHENSIVE METABOLIC PANEL
ALT: 18 U/L (ref 0–44)
AST: 32 U/L (ref 15–41)
Albumin: 3.2 g/dL — ABNORMAL LOW (ref 3.5–5.0)
Alkaline Phosphatase: 51 U/L (ref 38–126)
Anion gap: 13 (ref 5–15)
BUN: 53 mg/dL — ABNORMAL HIGH (ref 8–23)
CO2: 25 mmol/L (ref 22–32)
Calcium: 8.7 mg/dL — ABNORMAL LOW (ref 8.9–10.3)
Chloride: 107 mmol/L (ref 98–111)
Creatinine, Ser: 1.53 mg/dL — ABNORMAL HIGH (ref 0.61–1.24)
GFR calc Af Amer: 56 mL/min — ABNORMAL LOW (ref >60)
GFR calc non Af Amer: 48 mL/min — ABNORMAL LOW (ref >60)
Glucose, Bld: 72 mg/dL (ref 70–99)
Potassium: 4.5 mmol/L (ref 3.5–5.1)
Sodium: 145 mmol/L (ref 135–145)
Total Bilirubin: 0.4 mg/dL (ref 0.3–1.2)
Total Protein: 7.8 g/dL (ref 6.5–8.1)

## 2019-10-05 LAB — D-DIMER, QUANTITATIVE: D-Dimer, Quant: 0.87 ug/mL-FEU — ABNORMAL HIGH (ref 0.00–0.50)

## 2019-10-05 LAB — MRSA PCR SCREENING: MRSA by PCR: NEGATIVE

## 2019-10-05 LAB — C-REACTIVE PROTEIN: CRP: 13.6 mg/dL — ABNORMAL HIGH (ref <1.0)

## 2019-10-05 MED ORDER — FAMOTIDINE 20 MG PO TABS
20.0000 mg | ORAL_TABLET | Freq: Every day | ORAL | Status: DC
  Administered 2019-10-05 – 2019-10-20 (×16): 20 mg via ORAL
  Filled 2019-10-05 (×16): qty 1

## 2019-10-05 MED ORDER — SALINE SPRAY 0.65 % NA SOLN
1.0000 | NASAL | Status: DC | PRN
  Administered 2019-10-12 – 2019-10-15 (×2): 1 via NASAL
  Filled 2019-10-05: qty 44

## 2019-10-05 NOTE — Progress Notes (Signed)
Spoke to patient's wife, [name] and gave her updates on patient. Answered all of her questions and concerns regarding patient care. She requested a call from Dr. Sloan Leiter. I told her I would ask Dr. Sloan Leiter to call her.

## 2019-10-05 NOTE — Progress Notes (Signed)
Called patient's wife, [name] and provided updates. All questions/ concerns answered at this time.

## 2019-10-05 NOTE — Progress Notes (Signed)
PROGRESS NOTE                                                                                                                                                                                                             Patient Demographics:    William [name], is a 63 y.o. male, DOB - 30-Jan-1956, [id number]  Outpatient Primary MD for the patient is [name], MD   Admit date - 09/23/2019   LOS - 2  Chief Complaint  Patient presents with   COVID positive   Shortness of Breath       Brief Narrative: Patient is a 63 y.o. male with PMHx of HTN, dyslipidemia, DM-2, CAD-who presented with shortness of breath and cough-he was found to have acute hypoxic respiratory failure secondary to COVID-19 pneumonia.   Subjective:    William [name] today feels better-he was transition from humidified heated high flow to regular high flow.  O2 saturations are in the 90s.  Is awake and alert.   Assessment  & Plan :   Acute Hypoxic Resp Failure due to Covid 19 Viral pneumonia: Improved-transitioned from heated high flow to regular high flow-he feels better.  Continue remdesivir and steroids.  Patient is s/p Actemra and plasma on 11/17.  Suspect that if he remains stable on high flow oxygen-he probably could be downgraded to progressive care later today.  Fever: afebrile  O2 requirements:  SpO2: 91 % O2 Flow Rate (L/min): (S) 20 L/min FiO2 (%): (S) 60 %   COVID-19 Labs: Recent Labs    10/12/2019 1340 09/30/2019 1700 10/04/19 0600 10/05/19 0537  DDIMER 1.42*  --  1.21* 0.87*  FERRITIN  --  444* 470* 434*  LDH 445*  --   --   --   CRP 18.0*  --  16.1* 13.6*    Lab Results  Component Value Date   SARSCOV2NAA POSITIVE (A) 10/11/2019     COVID-19 Medications: Steroids:11/16>> Remdesivir:11/15>> Actemra:11/17>>x 1 Convalescent Plasma:11/17>>x1  Other medications: Diuretics:Euvolemic-no need for  lasix Antibiotics:Not needed as no evidence of bacterial infection  Prone/Incentive Spirometry: encouraged patient to lie prone for 3-4 hours at a time for a total of 16 hours a day, and to encourage incentive spirometry use 3-4/hour.  DVT Prophylaxis  :  Lovenox   AKI: Creatinine slowly improving-could have developed underlying CKD-follow and trend to see  way it settles down.  Chronic diastolic heart failure: Compensated.  No need for Lasix today.  DM-2 (A1c 8.1 on 10/06/2019): CBGs stable with Levemir 15 units twice daily, SSI  CBG (last 3)  Recent Labs    10/04/19 2335 10/05/19 0305 10/05/19 0506  GLUCAP 302* 145* 93    HTN: Controlled-continue metoprolol  Dyslipidemia: Continue statin  History of CAD: No anginal symptoms-continue dual antiplatelet agents, statin and metoprolol  Remote history of DVT: Doppler on 11/18 negative-on prophylactic dose of Lovenox  Obesity: Estimated body mass index is 35.24 kg/m as calculated from the following:   Height as of this encounter: 5\' 7"  (1.702 m).   Weight as of this encounter: 102.1 kg.     GI prophylaxis:H2 Blocker  Consults  :  PCCM  Procedures  :  None  ABG: No results found for: PHART, PCO2ART, PO2ART, HCO3, TCO2, ACIDBASEDEF, O2SAT  Vent Settings: N/A  Condition - Guarded  Family Communication  :  Spouse over the phone  Code Status :  Full Code  Diet :  Diet Order            Diet Carb Modified Fluid consistency: Thin; Room service appropriate? Yes  Diet effective now               Disposition Plan  :  Remain hospitalized-suspect can be transferred to PCU later today if he remains stable on regular high flow.  Barriers to discharge: Hypoxia requiring O2 supplementation/complete 5 days of IV Remdesivir  Antimicorbials  :    Anti-infectives (From admission, onward)   Start     Dose/Rate Route Frequency Ordered Stop   10/04/19 1600  remdesivir 100 mg in sodium chloride 0.9 % 250 mL IVPB     100  mg 500 mL/hr over 30 Minutes Intravenous Daily 10/12/2019 2358 10/07/19 0959   09/28/2019 2200  remdesivir 100 mg in sodium chloride 0.9 % 250 mL IVPB  Status:  Discontinued     100 mg 500 mL/hr over 30 Minutes Intravenous Every 24 hours 10/01/2019 1537 09/24/2019 2358      Inpatient Medications  Scheduled Meds:  aspirin EC  81 mg Oral Daily   atorvastatin  40 mg Oral q1800   Chlorhexidine Gluconate Cloth  6 each Topical Daily   dexamethasone (DECADRON) injection  6 mg Intravenous Q12H   enoxaparin (LOVENOX) injection  0.5 mg/kg Subcutaneous Q12H   insulin aspart  0-20 Units Subcutaneous Q4H   insulin detemir  15 Units Subcutaneous BID   Ipratropium-Albuterol  2 puff Inhalation Q6H   metoprolol tartrate  12.5 mg Oral BID   multivitamin with minerals  1 tablet Oral Daily   ticagrelor  90 mg Oral BID   vitamin B-12  100 mcg Oral Daily   vitamin C  500 mg Oral Daily   zinc sulfate  220 mg Oral Daily   Continuous Infusions:  remdesivir 100 mg in NS 250 mL 100 mg (10/04/19 1845)   PRN Meds:.acetaminophen, lip balm, nitroGLYCERIN, ondansetron **OR** ondansetron (ZOFRAN) IV, tetrahydrozoline  Time Spent in minutes  35   See all Orders from today for further details  Oren Binet M.D on 10/05/2019 at 8:32 AM  To page go to [email or web address] - use universal password  [facility] -  Office  [phone]    Objective:   Vitals:   10/05/19 0600 10/05/19 0610 10/05/19 0611 10/05/19 0700  BP: 127/64   (!) 133/58  Pulse: (!) 56 61 (!) 55 60  Resp: 14 18  20 (!) 29  Temp:      TempSrc:      SpO2: 96% 99% 100% 91%  Weight:      Height:        Wt Readings from Last 3 Encounters:  10/02/2019 102.1 kg  09/20/2019 102.1 kg  03/30/14 97.5 kg     Intake/Output Summary (Last 24 hours) at 10/05/2019 [id number] Last data filed at 10/05/2019 0400 Gross per 24 hour  Intake 2027.08 ml  Output 6900 ml  Net -4872.92 ml     Physical Exam Gen Exam:Alert awake-not in any  distress HEENT:atraumatic, normocephalic Chest: B/L clear to auscultation anteriorly CVS:S1S2 regular Abdomen:soft non tender, non distended Extremities:no edema Neurology: Non focal Skin: no rash   Data Review:    CBC Recent Labs  Lab 09/23/2019 1809 10/09/2019 1340 10/04/19 0600 10/05/19 0537  WBC 5.2 9.6 9.8 10.6*  HGB 12.1* 13.0 12.6* 12.7*  HCT 37.3* 40.9 39.9 39.4  PLT 257 276 370 408*  MCV 77.7* 79.9* 79.0* 77.7*  MCH 25.2* 25.4* 25.0* 25.0*  MCHC 32.4 31.8 31.6 32.2  RDW 14.5 14.7 14.8 14.6  LYMPHSABS 0.5* 0.6* 0.4* 0.8  MONOABS 0.2 0.7 0.3 0.7  EOSABS 0.0 0.0 0.0 0.0  BASOSABS 0.0 0.0 0.0 0.0    Chemistries  Recent Labs  Lab 09/30/2019 1809 09/26/2019 1340 10/04/19 0600 10/05/19 0537  NA 137 136 147* 145  K 4.6 4.6 5.0 4.5  CL 103 104 111 107  CO2 21* 17* 23 25  GLUCOSE 200* 314* 229* 72  BUN 25* 38* 44* 53*  CREATININE 1.87* 1.75* 1.62* 1.53*  CALCIUM 7.8* 7.7* 8.1* 8.7*  MG  --   --  3.7* 3.7*  AST 29 32 26 32  ALT 19 21 17 18   ALKPHOS 32* 38 44 51  BILITOT 0.7 0.5 0.4 0.4   ------------------------------------------------------------------------------------------------------------------ No results for input(s): CHOL, HDL, LDLCALC, TRIG, CHOLHDL, LDLDIRECT in the last 72 hours.  Lab Results  Component Value Date   HGBA1C 8.1 (H) 10/15/2019   ------------------------------------------------------------------------------------------------------------------ No results for input(s): TSH, T4TOTAL, T3FREE, THYROIDAB in the last 72 hours.  Invalid input(s): FREET3 ------------------------------------------------------------------------------------------------------------------ Recent Labs    10/04/19 0600 10/05/19 0537  FERRITIN 470* 434*    Coagulation profile No results for input(s): INR, PROTIME in the last 168 hours.  Recent Labs    10/04/19 0600 10/05/19 0537  DDIMER 1.21* 0.87*    Cardiac Enzymes No results for input(s): CKMB,  TROPONINI, MYOGLOBIN in the last 168 hours.  Invalid input(s): CK ------------------------------------------------------------------------------------------------------------------    Component Value Date/Time   BNP 26.7 10/10/2019 1809    Micro Results Recent Results (from the past 240 hour(s))  SARS CORONAVIRUS 2 (TAT 6-24 HRS) Nasopharyngeal Nasopharyngeal Swab     Status: Abnormal   Collection Time: 10/09/2019  6:09 PM   Specimen: Nasopharyngeal Swab  Result Value Ref Range Status   SARS Coronavirus 2 POSITIVE (A) NEGATIVE Final    Comment: RESULT CALLED TO, READ BACK BY AND VERIFIED WITH: Herby Abraham, RN AT 2324 ON 09/23/2019 BY SAINVILUS S (NOTE) SARS-CoV-2 target nucleic acids are DETECTED. The SARS-CoV-2 RNA is generally detectable in upper and lower respiratory specimens during the acute phase of infection. Positive results are indicative of active infection with SARS-CoV-2. Clinical  correlation with patient history and other diagnostic information is necessary to determine patient infection status. Positive results do  not rule out bacterial infection or co-infection with other viruses. The expected result is Negative. Fact Sheet for Patients: SugarRoll.be Fact  Sheet for Healthcare Providers: https://www.woods-mathews.com/ This test is not yet approved or cleared by the [place] FDA and  has been authorized for detection and/or diagnosis of SARS-CoV-2 by FDA under an Emergency Use Authorization (EUA). This EUA will remain  in effect (meaning this test can  be used) for the duration of the COVID-19 declaration under Section 564(b)(1) of the Act, 21 U.S.C. section 360bbb-3(b)(1), unless the authorization is terminated or revoked sooner. Performed at [facility], [place] [address] [place], [place] [address]   Blood culture (routine x 2)     Status: None (Preliminary result)   Collection Time: 09/30/2019  1:40 PM    Specimen: BLOOD  Result Value Ref Range Status   Specimen Description   Final    BLOOD RIGHT ANTECUBITAL Performed at [facility], [place] [address] [place], [place] [address]    Special Requests   Final    BOTTLES DRAWN AEROBIC AND ANAEROBIC Blood Culture adequate volume Performed at [facility] [address], [place], [place] [address]    Culture   Final    NO GROWTH < 24 HOURS Performed at [facility] [address] [place], [place] [address]    Report Status PENDING  Incomplete  Blood culture (routine x 2)     Status: None (Preliminary result)   Collection Time: 10/07/2019  1:45 PM   Specimen: BLOOD RIGHT WRIST  Result Value Ref Range Status   Specimen Description   Final    BLOOD RIGHT WRIST Performed at [facility] [address] [place], [place] [address]    Special Requests   Final    BOTTLES DRAWN AEROBIC AND ANAEROBIC Blood Culture adequate volume Performed at [facility] [address], [place], [place] [address]    Culture   Final    NO GROWTH < 24 HOURS Performed at [facility] [address] [place], [place] [address]    Report Status PENDING  Incomplete    Radiology Reports Dg Chest Port 1 View  Result Date: 10/04/2019 CLINICAL DATA:  Hypoxia EXAM: PORTABLE CHEST 1 VIEW COMPARISON:  09/28/2019 FINDINGS: Interstitial/patchy opacities in the bilateral upper and lower lobes, unchanged. No definite pleural effusions. No pneumothorax. The heart is top-normal in size. IMPRESSION: Multifocal pneumonia in this patient with known COVID, unchanged. Electronically Signed   By: Julian Hy M.D.   On: 10/04/2019 10:07   Dg Chest Portable 1 View  Result Date: 10/14/2019 CLINICAL DATA:  Hypoxia, shortness of breath. EXAM: PORTABLE CHEST 1 VIEW COMPARISON:  October 02, 2019. FINDINGS: The heart size and mediastinal contours are within normal limits. No pneumothorax or pleural  effusion is noted. Stable left perihilar and lingular opacity is noted as well as mild right upper lobe opacity consistent with multifocal pneumonia. The visualized skeletal structures are unremarkable. IMPRESSION: Stable bilateral lung opacities are noted, left greater than right, consistent with multifocal pneumonia. Electronically Signed   By: Marijo Conception M.D.   On: 09/28/2019 14:14   Dg Chest Port 1 View  Result Date: 10/15/2019 CLINICAL DATA:  Pt complains of chest pain x 3 days, non-radiating, worse with inspiration, cough, fever, and generally feeling unwell. Pt was unable to hold deep inspiration for x-ray due to coughing. EXAM: PORTABLE CHEST 1 VIEW COMPARISON:  01/07/2014 FINDINGS: Hazy interstitial and airspace opacities present in the left perihilar region, right upper lobe, and left lower lobe. Low lung volumes are present, causing  crowding of the pulmonary vasculature. No blunting of the costophrenic angles. Cardiac and mediastinal margins appear normal. IMPRESSION: 1. Hazy interstitial and airspace opacities in the left perihilar region, right upper lobe, and left lower lobe, suspicious for multilobar pneumonia or atypical pneumonia. 2. Low lung volumes. Electronically Signed   By: Van Clines M.D.   On: 09/24/2019 18:57   Le Venous  Result Date: 10/04/2019  Lower Venous Study Indications: Covid-19 positive, and Swelling.  Anticoagulation: Lovenox. Comparison Study: No priors Performing Technologist: Velva Harman Sturdivant RDMS, RVT  Examination Guidelines: A complete evaluation includes B-mode imaging, spectral Doppler, color Doppler, and power Doppler as needed of all accessible portions of each vessel. Bilateral testing is considered an integral part of a complete examination. Limited examinations for reoccurring indications may be performed as noted.  +---------+---------------+---------+-----------+----------+--------------+  RIGHT      Compressibility Phasicity Spontaneity Properties Thrombus Aging  +---------+---------------+---------+-----------+----------+--------------+  CFV       Full            Yes       Yes                                    +---------+---------------+---------+-----------+----------+--------------+  SFJ       Full                                                             +---------+---------------+---------+-----------+----------+--------------+  FV Prox   Full                                                             +---------+---------------+---------+-----------+----------+--------------+  FV Mid    Full                                                             +---------+---------------+---------+-----------+----------+--------------+  FV Distal Full                                                             +---------+---------------+---------+-----------+----------+--------------+  PFV       Full                                                             +---------+---------------+---------+-----------+----------+--------------+  POP       Full            No        Yes                                    +---------+---------------+---------+-----------+----------+--------------+  PTV       Full                                                             +---------+---------------+---------+-----------+----------+--------------+  PERO      Full                                                             +---------+---------------+---------+-----------+----------+--------------+   +---------+---------------+---------+-----------+----------+--------------+  LEFT      Compressibility Phasicity Spontaneity Properties Thrombus Aging  +---------+---------------+---------+-----------+----------+--------------+  CFV       Full            Yes       Yes                                    +---------+---------------+---------+-----------+----------+--------------+  SFJ       Full                                                              +---------+---------------+---------+-----------+----------+--------------+  FV Prox   Full                                                             +---------+---------------+---------+-----------+----------+--------------+  FV Mid    Full                                                             +---------+---------------+---------+-----------+----------+--------------+  FV Distal Full                                                             +---------+---------------+---------+-----------+----------+--------------+  PFV       Full                                                             +---------+---------------+---------+-----------+----------+--------------+  POP       Full            Yes       Yes                                    +---------+---------------+---------+-----------+----------+--------------+  PTV       Full                                                             +---------+---------------+---------+-----------+----------+--------------+  PERO      Full                                                             +---------+---------------+---------+-----------+----------+--------------+     Summary: Right: There is no evidence of deep vein thrombosis in the lower extremity. No cystic structure found in the popliteal fossa. Left: There is no evidence of deep vein thrombosis in the lower extremity. No cystic structure found in the popliteal fossa.  *See table(s) above for measurements and observations. Electronically signed by Ruta Hinds MD on 10/04/2019 at 3:31:58 PM.    Final

## 2019-10-05 NOTE — Progress Notes (Signed)
Inpatient Diabetes Program Recommendations  AACE/ADA: New Consensus Statement on Inpatient Glycemic Control (2015)  Target Ranges:  Prepandial:   less than 140 mg/dL      Peak postprandial:   less than 180 mg/dL (1-2 hours)      Critically ill patients:  140 - 180 mg/dL   Lab Results  Component Value Date   GLUCAP 363 (H) 10/05/2019   HGBA1C 8.1 (H) 10/10/2019    Review of Glycemic Control Results for William [name], William [name] (MRN [id number]) as of 10/05/2019 14:50  Ref. Range 10/04/2019 23:35 10/05/2019 03:05 10/05/2019 05:06 10/05/2019 08:36 10/05/2019 11:14  Glucose-Capillary Latest Ref Range: 70 - 99 mg/dL 302 (H) Novolog 15 units 145 (H) 93 119 (H) 363 (H) Novolog 20 units Levemir 15 units   Inpatient Diabetes Program Recommendations:   Postprandial CBGs elevated. Please consider: Novolog 5 units tid meal coverage if eats 50%  Thank you, Bethena Roys E. Dashea Mcmullan, RN, MSN, CDE  Diabetes Coordinator Inpatient Glycemic Control Team Team Pager [phone] (8am-5pm) 10/05/2019 2:56 PM

## 2019-10-05 NOTE — Progress Notes (Signed)
Spoke to patient's wife [name] and gave her updates on patient care. Answered all of Sybil's questions regarding patient care.

## 2019-10-06 LAB — COMPREHENSIVE METABOLIC PANEL
ALT: 30 U/L (ref 0–44)
AST: 53 U/L — ABNORMAL HIGH (ref 15–41)
Albumin: 3 g/dL — ABNORMAL LOW (ref 3.5–5.0)
Alkaline Phosphatase: 63 U/L (ref 38–126)
Anion gap: 12 (ref 5–15)
BUN: 50 mg/dL — ABNORMAL HIGH (ref 8–23)
CO2: 22 mmol/L (ref 22–32)
Calcium: 8.4 mg/dL — ABNORMAL LOW (ref 8.9–10.3)
Chloride: 107 mmol/L (ref 98–111)
Creatinine, Ser: 1.33 mg/dL — ABNORMAL HIGH (ref 0.61–1.24)
GFR calc Af Amer: >60 mL/min (ref >60)
GFR calc non Af Amer: 57 mL/min — ABNORMAL LOW (ref >60)
Glucose, Bld: 109 mg/dL — ABNORMAL HIGH (ref 70–99)
Potassium: 4.5 mmol/L (ref 3.5–5.1)
Sodium: 141 mmol/L (ref 135–145)
Total Bilirubin: 0.4 mg/dL (ref 0.3–1.2)
Total Protein: 7.1 g/dL (ref 6.5–8.1)

## 2019-10-06 LAB — CBC WITH DIFFERENTIAL/PLATELET
Abs Immature Granulocytes: 0.14 10*3/uL — ABNORMAL HIGH (ref 0.00–0.07)
Basophils Absolute: 0 10*3/uL (ref 0.0–0.1)
Basophils Relative: 0 %
Eosinophils Absolute: 0 10*3/uL (ref 0.0–0.5)
Eosinophils Relative: 0 %
HCT: 38.4 % — ABNORMAL LOW (ref 39.0–52.0)
Hemoglobin: 12.5 g/dL — ABNORMAL LOW (ref 13.0–17.0)
Immature Granulocytes: 1 %
Lymphocytes Relative: 5 %
Lymphs Abs: 0.6 10*3/uL — ABNORMAL LOW (ref 0.7–4.0)
MCH: 25.5 pg — ABNORMAL LOW (ref 26.0–34.0)
MCHC: 32.6 g/dL (ref 30.0–36.0)
MCV: 78.4 fL — ABNORMAL LOW (ref 80.0–100.0)
Monocytes Absolute: 0.6 10*3/uL (ref 0.1–1.0)
Monocytes Relative: 5 %
Neutro Abs: 10.2 10*3/uL — ABNORMAL HIGH (ref 1.7–7.7)
Neutrophils Relative %: 89 %
Platelets: 415 10*3/uL — ABNORMAL HIGH (ref 150–400)
RBC: 4.9 MIL/uL (ref 4.22–5.81)
RDW: 14.6 % (ref 11.5–15.5)
WBC: 11.6 10*3/uL — ABNORMAL HIGH (ref 4.0–10.5)
nRBC: 0.8 % — ABNORMAL HIGH (ref 0.0–0.2)

## 2019-10-06 LAB — GLUCOSE, CAPILLARY
Glucose-Capillary: 120 mg/dL — ABNORMAL HIGH (ref 70–99)
Glucose-Capillary: 172 mg/dL — ABNORMAL HIGH (ref 70–99)
Glucose-Capillary: 398 mg/dL — ABNORMAL HIGH (ref 70–99)
Glucose-Capillary: 295 mg/dL — ABNORMAL HIGH (ref 70–99)
Glucose-Capillary: 324 mg/dL — ABNORMAL HIGH (ref 70–99)

## 2019-10-06 LAB — C-REACTIVE PROTEIN: CRP: 7.3 mg/dL — ABNORMAL HIGH (ref <1.0)

## 2019-10-06 LAB — FERRITIN: Ferritin: 344 ng/mL — ABNORMAL HIGH (ref 24–336)

## 2019-10-06 LAB — MAGNESIUM: Magnesium: 3.2 mg/dL — ABNORMAL HIGH (ref 1.7–2.4)

## 2019-10-06 LAB — D-DIMER, QUANTITATIVE: D-Dimer, Quant: 1.15 ug/mL-FEU — ABNORMAL HIGH (ref 0.00–0.50)

## 2019-10-06 MED ORDER — FUROSEMIDE 10 MG/ML IJ SOLN
40.0000 mg | Freq: Once | INTRAMUSCULAR | Status: AC
  Administered 2019-10-06: 40 mg via INTRAVENOUS
  Filled 2019-10-06: qty 4

## 2019-10-06 NOTE — Progress Notes (Signed)
Inpatient Diabetes Program Recommendations  AACE/ADA: New Consensus Statement on Inpatient Glycemic Control (2015)  Target Ranges:  Prepandial:   less than 140 mg/dL      Peak postprandial:   less than 180 mg/dL (1-2 hours)      Critically ill patients:  140 - 180 mg/dL   Lab Results  Component Value Date   GLUCAP 398 (H) 10/06/2019   HGBA1C 8.1 (H) 09/29/2019    Review of Glycemic Control  Results for William, [name] (MRN [id number]) as of 10/06/2019 14:11  Ref. Range 10/05/2019 19:57 10/05/2019 23:09 10/06/2019 02:54 10/06/2019 07:55 10/06/2019 11:49  Glucose-Capillary Latest Ref Range: 70 - 99 mg/dL 322 (H) Novolog 15units 231 (H) Novolog 7units 120 (H) 172 (H) 398 (H) Novolog 20units    Diabetes history: DM2 Outpatient Diabetes medications: Jardiance 25mg  PO QD + Jentadveto 2.5-1000mg  tabs Current orders for Inpatient glycemic control: Levemir 15 units BID + Novolog 0-20 Q4HR + decadron 6mg  BID  Inpatient Diabetes Program Recommendations:  Correction: Please consider Novolog correction 0-20 units TID with meals + 0-5 HS  Meal coverage: Please consider adding Novolog 4 units with meals if eats at least 50%     Thank you, Geoffry Paradise, RN, BSN Diabetes Coordinator Inpatient Diabetes Program [phone] (team pager from 8a-5p)

## 2019-10-06 NOTE — Evaluation (Signed)
Physical Therapy Evaluation Patient Details Name: William [name] MRN: [id number] DOB: [birth year] Today's Date: 10/06/2019   History of Present Illness  Patient is a 63 y.o. male with PMHx of HTN, dyslipidemia, DM-2, CAD-who presented with shortness of breath and cough-he was found to have acute hypoxic respiratory failure secondary to COVID-19 pneumonia.  Clinical Impression  The patient received standing in room,working on breathing. Patient on 8 L HFNC. Patient ambulated in room on 8 L, initially frequent stops to relax and breathe, patient noted to huff and have abored breaths. SPO2 85% at times, mostly low 90's. Patient given flutter device and instructed in it's use. As patient ambulated more, patient used device as well as instructed and performed diaphragmatic breathing during increased dyspnea. Over the course of ambulating and working on breathing techniques, noted decreased labored breaths, patient could talk while ambulating, able to ambulate on 6 L with SPO2> 92%, at times 100%. The right ear was site of SPO2 probe. Patient appeared more relaxed and less anxious. Provided theraband for UE exercises.    Patient remained standing, on 6 L HFNMC and SPO2 >92%. RN aware of decreased O2 to 6 L. Pt admitted with above diagnosis. Pt currently with functional limitations due to the deficits listed below (see PT Problem List). Pt will benefit from skilled PT to increase their independence and safety with mobility to allow discharge to the venue listed below.      Follow Up Recommendations No PT follow up    Equipment Recommendations  None recommended by PT    Recommendations for Other Services       Precautions / Restrictions Precautions Precaution Comments: monitor SPO2, try weaning      Mobility  Bed Mobility               General bed mobility comments: standing at bedside  Transfers Overall transfer level: Independent                   Ambulation/Gait Ambulation/Gait assistance: Supervision Gait Distance (Feet): 300 Feet(in room back and forth) Assistive device: None Gait Pattern/deviations: Step-through pattern;Decreased stride length Gait velocity: decr   General Gait Details: initially cadence very slow, required several stop rext and catch breath breaks. At end of ambulation, increased speed and less dyspnea  Stairs            Wheelchair Mobility    Modified Rankin (Stroke Patients Only)       Balance Overall balance assessment: No apparent balance deficits (not formally assessed)                                           Pertinent Vitals/Pain      Home Living Family/patient expects to be discharged to:: Private residence Living Arrangements: Spouse/significant other;Children Available Help at Discharge: Family Type of Home: House Home Access: Stairs to enter   Technical brewer of Steps: 2 Home Layout: Two level;Able to live on main level with bedroom/bathroom Home Equipment: None      Prior Function Level of Independence: Independent         Comments: works     Journalist, newspaper        Extremity/Trunk Assessment   Upper Extremity Assessment Upper Extremity Assessment: Overall WFL for tasks assessed    Lower Extremity Assessment Lower Extremity Assessment: Overall WFL for tasks assessed    Cervical / Trunk Assessment Cervical /  Trunk Assessment: Normal  Communication   Communication: No difficulties  Cognition Arousal/Alertness: Awake/alert Behavior During Therapy: WFL for tasks assessed/performed Overall Cognitive Status: Within Functional Limits for tasks assessed                                        General Comments      Exercises General Exercises - Upper Extremity Shoulder Extension: AROM;10 reps;Standing;Theraband;Both Theraband Level (Shoulder Extension): Level 2 (Red) Shoulder ABduction: AROM;10  reps;Standing;Both;Theraband Theraband Level (Shoulder Abduction): Level 2 (Red) Shoulder Horizontal ABduction: AROM;Standing;Both;10 reps;Theraband Theraband Level (Shoulder Horizontal Abduction): Level 2 (Red) Elbow Extension: AROM;10 reps;Theraband;Both;Standing Theraband Level (Elbow Extension): Level 2 (Red) General Exercises - Lower Extremity Hip ABduction/ADduction: AROM;Both;5 reps;Standing Hip Flexion/Marching: AROM;Standing;Both;5 reps   Assessment/Plan    PT Assessment Patient needs continued PT services  PT Problem List Decreased activity tolerance;Decreased mobility;Cardiopulmonary status limiting activity;Decreased safety awareness;Decreased knowledge of precautions       PT Treatment Interventions Gait training;Functional mobility training;Therapeutic activities;Therapeutic exercise;Patient/family education    PT Goals (Current goals can be found in the Care Plan section)  Acute Rehab PT Goals Patient Stated Goal: to be able to walk in the park PT Goal Formulation: With patient Time For Goal Achievement: 10/20/19 Potential to Achieve Goals: Good    Frequency Min 3X/week   Barriers to discharge        Co-evaluation               AM-PAC PT "6 Clicks" Mobility  Outcome Measure Help needed turning from your back to your side while in a flat bed without using bedrails?: None Help needed moving from lying on your back to sitting on the side of a flat bed without using bedrails?: None Help needed moving to and from a bed to a chair (including a wheelchair)?: None Help needed standing up from a chair using your arms (e.g., wheelchair or bedside chair)?: None Help needed to walk in hospital room?: A Little Help needed climbing 3-5 steps with a railing? : A Lot 6 Click Score: 21    End of Session Equipment Utilized During Treatment: Oxygen Activity Tolerance: Patient tolerated treatment well Patient left: with nursing/sitter in room(standing at  recliner) Nurse Communication: Mobility status      Time: 1500-1600 PT Time Calculation (min) (ACUTE ONLY): 60 min   Charges:   PT Evaluation $PT Eval Low Complexity: 1 Low PT Treatments $Gait Training: 23-37 mins $Therapeutic Exercise: 8-22 mins        [place]  Office [phone]   Claretha Cooper 10/06/2019, 4:24 PM

## 2019-10-06 NOTE — Progress Notes (Signed)
PROGRESS NOTE                                                                                                                                                                                                             Patient Demographics:    William [name], is a 63 y.o. male, DOB - 1956-02-08, [id number]  Outpatient Primary MD for the patient is [name], MD   Admit date - 09/23/2019   LOS - 3  Chief Complaint  Patient presents with   COVID positive   Shortness of Breath       Brief Narrative: Patient is a 63 y.o. male with PMHx of HTN, dyslipidemia, DM-2, CAD-who presented with shortness of breath and cough-he was found to have acute hypoxic respiratory failure secondary to COVID-19 pneumonia.   Subjective:    William [name] feels much better-on 9-10 HF O2. Sitting up in chair-no Chest pain   Assessment  & Plan :   Acute Hypoxic Resp Failure due to Covid 19 Viral pneumonia: improved-decreased O2 requirement-continue steroids, remdesivir. Ok to transfer to the PCU  Fever: afebrile  O2 requirements:  SpO2: 96 % O2 Flow Rate (L/min): 10 L/min FiO2 (%): 60 %   COVID-19 Labs: Recent Labs    09/28/2019 1340  10/04/19 0600 10/05/19 0537 10/06/19 0442  DDIMER 1.42*  --  1.21* 0.87* 1.15*  FERRITIN  --    < > 470* 434* 344*  LDH 445*  --   --   --   --   CRP 18.0*  --  16.1* 13.6* 7.3*   < > = values in this interval not displayed.    Lab Results  Component Value Date   SARSCOV2NAA POSITIVE (A) 09/22/2019     COVID-19 Medications: Steroids:11/16>> Remdesivir:11/15>> Actemra:11/17>>x 1 Convalescent Plasma:11/17>>x1  Other medications: Diuretics:Euvolemic-Lasix x 1 today to maintain neg balance Antibiotics:Not needed as no evidence of bacterial infection  Prone/Incentive Spirometry: encouraged patient to lie prone for 3-4 hours at a time for a total of 16 hours a day, and to encourage  incentive spirometry use 3-4/hour.  DVT Prophylaxis  :  Lovenox   AKI: Creatinine slowly improving-could have developed underlying CKD-follow and trend to see way it settles down.  Chronic diastolic heart failure: Compensated.   DM-2 (A1c 8.1 on 10/06/2019): CBG's stable-continue Levemir and SSI   CBG (  last 3)  Recent Labs    10/05/19 1957 10/05/19 2309 10/06/19 0254  GLUCAP 322* 231* 120*    HTN: Controlled-continue metoprolol  Dyslipidemia: Continue statin  History of CAD: No anginal symptoms-continue dual antiplatelet agents, statin and metoprolol  Remote history of DVT: Doppler on 11/18 negative-on prophylactic dose of Lovenox  Obesity: Estimated body mass index is 35.24 kg/m as calculated from the following:   Height as of this encounter: 5\' 7"  (1.702 m).   Weight as of this encounter: 102.1 kg.     GI prophylaxis:H2 Blocker  Consults  :  PCCM  Procedures  :  None  ABG: No results found for: PHART, PCO2ART, PO2ART, HCO3, TCO2, ACIDBASEDEF, O2SAT  Vent Settings: N/A  Condition - Guarded  Family Communication  :  Will update spouse later  Code Status :  Full Code  Diet :  Diet Order            Diet Carb Modified Fluid consistency: Thin; Room service appropriate? Yes  Diet effective now               Disposition Plan  :  Remain hospitalized-suspect can be transferred to PCU later today if he remains stable on regular high flow.  Barriers to discharge: Hypoxia requiring O2 supplementation/complete 5 days of IV Remdesivir  Antimicorbials  :    Anti-infectives (From admission, onward)   Start     Dose/Rate Route Frequency Ordered Stop   10/04/19 1600  remdesivir 100 mg in sodium chloride 0.9 % 250 mL IVPB     100 mg 500 mL/hr over 30 Minutes Intravenous Daily 09/18/2019 2358 10/07/19 0959   10/01/2019 2200  remdesivir 100 mg in sodium chloride 0.9 % 250 mL IVPB  Status:  Discontinued     100 mg 500 mL/hr over 30 Minutes Intravenous Every 24 hours  10/01/2019 1537 10/15/2019 2358      Inpatient Medications  Scheduled Meds:  aspirin EC  81 mg Oral Daily   atorvastatin  40 mg Oral q1800   Chlorhexidine Gluconate Cloth  6 each Topical Daily   dexamethasone (DECADRON) injection  6 mg Intravenous Q12H   enoxaparin (LOVENOX) injection  0.5 mg/kg Subcutaneous Q12H   famotidine  20 mg Oral Daily   insulin aspart  0-20 Units Subcutaneous Q4H   insulin detemir  15 Units Subcutaneous BID   Ipratropium-Albuterol  2 puff Inhalation Q6H   metoprolol tartrate  12.5 mg Oral BID   multivitamin with minerals  1 tablet Oral Daily   ticagrelor  90 mg Oral BID   vitamin B-12  100 mcg Oral Daily   vitamin C  500 mg Oral Daily   zinc sulfate  220 mg Oral Daily   Continuous Infusions:  remdesivir 100 mg in NS 250 mL Stopped (10/05/19 1201)   PRN Meds:.acetaminophen, lip balm, nitroGLYCERIN, ondansetron **OR** ondansetron (ZOFRAN) IV, sodium chloride, tetrahydrozoline  Time Spent in minutes  35   See all Orders from today for further details  Oren Binet M.D on 10/06/2019 at 7:47 AM  To page go to [email or web address] - use universal password  [facility] -  Office  [phone]    Objective:   Vitals:   10/06/19 0300 10/06/19 0429 10/06/19 0500 10/06/19 0549  BP: (!) 133/54 (!) 150/77    Pulse: (!) 52 63 62 (!) 50  Resp: (!) 28 (!) 29 (!) 26 (!) 28  Temp:      TempSrc:      SpO2: 100% (!) 86%  92% 96%  Weight:      Height:        Wt Readings from Last 3 Encounters:  10/17/2019 102.1 kg  09/20/2019 102.1 kg  03/30/14 97.5 kg     Intake/Output Summary (Last 24 hours) at 10/06/2019 0747 Last data filed at 10/06/2019 0600 Gross per 24 hour  Intake 249.54 ml  Output 2875 ml  Net -2625.46 ml     Physical Exam Gen Exam:Alert awake-not in any distress HEENT:atraumatic, normocephalic Chest: B/L clear to auscultation anteriorly CVS:S1S2 regular Abdomen:soft non tender, non distended Extremities:no  edema Neurology: Non focal Skin: no rash  Data Review:    CBC Recent Labs  Lab 09/24/2019 1809 10/12/2019 1340 10/04/19 0600 10/05/19 0537 10/06/19 0442  WBC 5.2 9.6 9.8 10.6* 11.6*  HGB 12.1* 13.0 12.6* 12.7* 12.5*  HCT 37.3* 40.9 39.9 39.4 38.4*  PLT 257 276 370 408* 415*  MCV 77.7* 79.9* 79.0* 77.7* 78.4*  MCH 25.2* 25.4* 25.0* 25.0* 25.5*  MCHC 32.4 31.8 31.6 32.2 32.6  RDW 14.5 14.7 14.8 14.6 14.6  LYMPHSABS 0.5* 0.6* 0.4* 0.8 0.6*  MONOABS 0.2 0.7 0.3 0.7 0.6  EOSABS 0.0 0.0 0.0 0.0 0.0  BASOSABS 0.0 0.0 0.0 0.0 0.0    Chemistries  Recent Labs  Lab 10/15/2019 1809 10/15/2019 1340 10/04/19 0600 10/05/19 0537 10/06/19 0442  NA 137 136 147* 145 141  K 4.6 4.6 5.0 4.5 4.5  CL 103 104 111 107 107  CO2 21* 17* 23 25 22   GLUCOSE 200* 314* 229* 72 109*  BUN 25* 38* 44* 53* 50*  CREATININE 1.87* 1.75* 1.62* 1.53* 1.33*  CALCIUM 7.8* 7.7* 8.1* 8.7* 8.4*  MG  --   --  3.7* 3.7* 3.2*  AST 29 32 26 32 53*  ALT 19 21 17 18 30   ALKPHOS 32* 38 44 51 63  BILITOT 0.7 0.5 0.4 0.4 0.4   ------------------------------------------------------------------------------------------------------------------ No results for input(s): CHOL, HDL, LDLCALC, TRIG, CHOLHDL, LDLDIRECT in the last 72 hours.  Lab Results  Component Value Date   HGBA1C 8.1 (H) 09/29/2019   ------------------------------------------------------------------------------------------------------------------ No results for input(s): TSH, T4TOTAL, T3FREE, THYROIDAB in the last 72 hours.  Invalid input(s): FREET3 ------------------------------------------------------------------------------------------------------------------ Recent Labs    10/05/19 0537 10/06/19 0442  FERRITIN 434* 344*    Coagulation profile No results for input(s): INR, PROTIME in the last 168 hours.  Recent Labs    10/05/19 0537 10/06/19 0442  DDIMER 0.87* 1.15*    Cardiac Enzymes No results for input(s): CKMB, TROPONINI, MYOGLOBIN  in the last 168 hours.  Invalid input(s): CK ------------------------------------------------------------------------------------------------------------------    Component Value Date/Time   BNP 26.7 10/12/2019 1809    Micro Results Recent Results (from the past 240 hour(s))  SARS CORONAVIRUS 2 (TAT 6-24 HRS) Nasopharyngeal Nasopharyngeal Swab     Status: Abnormal   Collection Time: 10/08/2019  6:09 PM   Specimen: Nasopharyngeal Swab  Result Value Ref Range Status   SARS Coronavirus 2 POSITIVE (A) NEGATIVE Final    Comment: RESULT CALLED TO, READ BACK BY AND VERIFIED WITH: Herby Abraham, RN AT 2324 ON 10/07/2019 BY SAINVILUS S (NOTE) SARS-CoV-2 target nucleic acids are DETECTED. The SARS-CoV-2 RNA is generally detectable in upper and lower respiratory specimens during the acute phase of infection. Positive results are indicative of active infection with SARS-CoV-2. Clinical  correlation with patient history and other diagnostic information is necessary to determine patient infection status. Positive results do  not rule out bacterial infection or co-infection with other viruses. The expected  result is Negative. Fact Sheet for Patients: SugarRoll.be Fact Sheet for Healthcare Providers: https://www.woods-mathews.com/ This test is not yet approved or cleared by the [place] FDA and  has been authorized for detection and/or diagnosis of SARS-CoV-2 by FDA under an Emergency Use Authorization (EUA). This EUA will remain  in effect (meaning this test can  be used) for the duration of the COVID-19 declaration under Section 564(b)(1) of the Act, 21 U.S.C. section 360bbb-3(b)(1), unless the authorization is terminated or revoked sooner. Performed at [facility], [place] [address], [place], [place] [address]   Blood culture (routine x 2)     Status: None (Preliminary result)   Collection Time: 10/09/2019  1:40 PM   Specimen: BLOOD  Result  Value Ref Range Status   Specimen Description   Final    BLOOD RIGHT ANTECUBITAL Performed at [facility], [place] [address], [place], [place] [address]    Special Requests   Final    BOTTLES DRAWN AEROBIC AND ANAEROBIC Blood Culture adequate volume Performed at [facility] [address] [place], [place] [address]    Culture   Final    NO GROWTH 2 DAYS Performed at [facility] [address] [place], [place] [address]    Report Status PENDING  Incomplete  Blood culture (routine x 2)     Status: None (Preliminary result)   Collection Time: 09/20/2019  1:45 PM   Specimen: BLOOD RIGHT WRIST  Result Value Ref Range Status   Specimen Description   Final    BLOOD RIGHT WRIST Performed at [facility] [address], [place], [place] [address]    Special Requests   Final    BOTTLES DRAWN AEROBIC AND ANAEROBIC Blood Culture adequate volume Performed at [facility] [address], [place], [place] [address]    Culture   Final    NO GROWTH 2 DAYS Performed at [facility] [address], [place], [place] [address]    Report Status PENDING  Incomplete  MRSA PCR Screening     Status: None   Collection Time: 10/05/19  6:39 PM   Specimen: Nasopharyngeal  Result Value Ref Range Status   MRSA by PCR NEGATIVE NEGATIVE Final    Comment:        The GeneXpert MRSA Assay (FDA approved for NASAL specimens only), is one component of a comprehensive MRSA colonization surveillance program. It is not intended to diagnose MRSA infection nor to guide or monitor treatment for MRSA infections. Performed at [facility], [place] [address] [place],  [address]     Radiology Reports Dg Chest Port 1 View  Result Date: 10/04/2019 CLINICAL DATA:  Hypoxia EXAM: PORTABLE CHEST 1 VIEW COMPARISON:  10/05/2019 FINDINGS: Interstitial/patchy opacities in the bilateral upper and lower lobes, unchanged.  No definite pleural effusions. No pneumothorax. The heart is top-normal in size. IMPRESSION: Multifocal pneumonia in this patient with known COVID, unchanged. Electronically Signed   By: Julian Hy M.D.   On: 10/04/2019 10:07   Dg Chest Portable 1 View  Result Date: 09/19/2019 CLINICAL DATA:  Hypoxia, shortness of breath. EXAM: PORTABLE CHEST 1 VIEW COMPARISON:  October 02, 2019. FINDINGS: The heart size and mediastinal contours are within normal limits. No pneumothorax or pleural effusion is noted. Stable left perihilar and lingular opacity is noted as well as mild right upper lobe opacity consistent with multifocal pneumonia. The visualized skeletal structures are unremarkable. IMPRESSION: Stable  bilateral lung opacities are noted, left greater than right, consistent with multifocal pneumonia. Electronically Signed   By: Marijo Conception M.D.   On: 09/25/2019 14:14   Dg Chest Port 1 View  Result Date: 10/17/2019 CLINICAL DATA:  Pt complains of chest pain x 3 days, non-radiating, worse with inspiration, cough, fever, and generally feeling unwell. Pt was unable to hold deep inspiration for x-ray due to coughing. EXAM: PORTABLE CHEST 1 VIEW COMPARISON:  01/07/2014 FINDINGS: Hazy interstitial and airspace opacities present in the left perihilar region, right upper lobe, and left lower lobe. Low lung volumes are present, causing crowding of the pulmonary vasculature. No blunting of the costophrenic angles. Cardiac and mediastinal margins appear normal. IMPRESSION: 1. Hazy interstitial and airspace opacities in the left perihilar region, right upper lobe, and left lower lobe, suspicious for multilobar pneumonia or atypical pneumonia. 2. Low lung volumes. Electronically Signed   By: Van Clines M.D.   On: 10/12/2019 18:57   Le Venous  Result Date: 10/04/2019  Lower Venous Study Indications: Covid-19 positive, and Swelling.  Anticoagulation: Lovenox. Comparison Study: No priors Performing  Technologist: Velva Harman Sturdivant RDMS, RVT  Examination Guidelines: A complete evaluation includes B-mode imaging, spectral Doppler, color Doppler, and power Doppler as needed of all accessible portions of each vessel. Bilateral testing is considered an integral part of a complete examination. Limited examinations for reoccurring indications may be performed as noted.  +---------+---------------+---------+-----------+----------+--------------+  RIGHT     Compressibility Phasicity Spontaneity Properties Thrombus Aging  +---------+---------------+---------+-----------+----------+--------------+  CFV       Full            Yes       Yes                                    +---------+---------------+---------+-----------+----------+--------------+  SFJ       Full                                                             +---------+---------------+---------+-----------+----------+--------------+  FV Prox   Full                                                             +---------+---------------+---------+-----------+----------+--------------+  FV Mid    Full                                                             +---------+---------------+---------+-----------+----------+--------------+  FV Distal Full                                                             +---------+---------------+---------+-----------+----------+--------------+  PFV  Full                                                             +---------+---------------+---------+-----------+----------+--------------+  POP       Full            No        Yes                                    +---------+---------------+---------+-----------+----------+--------------+  PTV       Full                                                             +---------+---------------+---------+-----------+----------+--------------+  PERO      Full                                                              +---------+---------------+---------+-----------+----------+--------------+   +---------+---------------+---------+-----------+----------+--------------+  LEFT      Compressibility Phasicity Spontaneity Properties Thrombus Aging  +---------+---------------+---------+-----------+----------+--------------+  CFV       Full            Yes       Yes                                    +---------+---------------+---------+-----------+----------+--------------+  SFJ       Full                                                             +---------+---------------+---------+-----------+----------+--------------+  FV Prox   Full                                                             +---------+---------------+---------+-----------+----------+--------------+  FV Mid    Full                                                             +---------+---------------+---------+-----------+----------+--------------+  FV Distal Full                                                             +---------+---------------+---------+-----------+----------+--------------+  PFV       Full                                                             +---------+---------------+---------+-----------+----------+--------------+  POP       Full            Yes       Yes                                    +---------+---------------+---------+-----------+----------+--------------+  PTV       Full                                                             +---------+---------------+---------+-----------+----------+--------------+  PERO      Full                                                             +---------+---------------+---------+-----------+----------+--------------+     Summary: Right: There is no evidence of deep vein thrombosis in the lower extremity. No cystic structure found in the popliteal fossa. Left: There is no evidence of deep vein thrombosis in the lower extremity. No cystic structure found in the popliteal fossa.  *See  table(s) above for measurements and observations. Electronically signed by Ruta Hinds MD on 10/04/2019 at 3:31:58 PM.    Final

## 2019-10-06 NOTE — Progress Notes (Signed)
pts wife called patient via his cell phone for update.

## 2019-10-07 LAB — COMPREHENSIVE METABOLIC PANEL
ALT: 35 U/L (ref 0–44)
AST: 40 U/L (ref 15–41)
Albumin: 3.1 g/dL — ABNORMAL LOW (ref 3.5–5.0)
Alkaline Phosphatase: 81 U/L (ref 38–126)
Anion gap: 13 (ref 5–15)
BUN: 51 mg/dL — ABNORMAL HIGH (ref 8–23)
CO2: 23 mmol/L (ref 22–32)
Calcium: 8.6 mg/dL — ABNORMAL LOW (ref 8.9–10.3)
Chloride: 106 mmol/L (ref 98–111)
Creatinine, Ser: 1.42 mg/dL — ABNORMAL HIGH (ref 0.61–1.24)
GFR calc Af Amer: >60 mL/min (ref >60)
GFR calc non Af Amer: 53 mL/min — ABNORMAL LOW (ref >60)
Glucose, Bld: 86 mg/dL (ref 70–99)
Potassium: 4.2 mmol/L (ref 3.5–5.1)
Sodium: 142 mmol/L (ref 135–145)
Total Bilirubin: 0.6 mg/dL (ref 0.3–1.2)
Total Protein: 7.2 g/dL (ref 6.5–8.1)

## 2019-10-07 LAB — CBC WITH DIFFERENTIAL/PLATELET
Abs Immature Granulocytes: 0.16 10*3/uL — ABNORMAL HIGH (ref 0.00–0.07)
Basophils Absolute: 0 10*3/uL (ref 0.0–0.1)
Basophils Relative: 0 %
Eosinophils Absolute: 0 10*3/uL (ref 0.0–0.5)
Eosinophils Relative: 0 %
HCT: 39.8 % (ref 39.0–52.0)
Hemoglobin: 12.8 g/dL — ABNORMAL LOW (ref 13.0–17.0)
Immature Granulocytes: 2 %
Lymphocytes Relative: 6 %
Lymphs Abs: 0.6 10*3/uL — ABNORMAL LOW (ref 0.7–4.0)
MCH: 25.1 pg — ABNORMAL LOW (ref 26.0–34.0)
MCHC: 32.2 g/dL (ref 30.0–36.0)
MCV: 78 fL — ABNORMAL LOW (ref 80.0–100.0)
Monocytes Absolute: 0.5 10*3/uL (ref 0.1–1.0)
Monocytes Relative: 5 %
Neutro Abs: 9.1 10*3/uL — ABNORMAL HIGH (ref 1.7–7.7)
Neutrophils Relative %: 87 %
Platelets: 463 10*3/uL — ABNORMAL HIGH (ref 150–400)
RBC: 5.1 MIL/uL (ref 4.22–5.81)
RDW: 14.2 % (ref 11.5–15.5)
WBC: 10.4 10*3/uL (ref 4.0–10.5)
nRBC: 0.9 % — ABNORMAL HIGH (ref 0.0–0.2)

## 2019-10-07 LAB — GLUCOSE, CAPILLARY
Glucose-Capillary: 155 mg/dL — ABNORMAL HIGH (ref 70–99)
Glucose-Capillary: 318 mg/dL — ABNORMAL HIGH (ref 70–99)
Glucose-Capillary: 392 mg/dL — ABNORMAL HIGH (ref 70–99)
Glucose-Capillary: 58 mg/dL — ABNORMAL LOW (ref 70–99)
Glucose-Capillary: 79 mg/dL (ref 70–99)

## 2019-10-07 LAB — C-REACTIVE PROTEIN: CRP: 4.7 mg/dL — ABNORMAL HIGH (ref <1.0)

## 2019-10-07 LAB — MAGNESIUM: Magnesium: 2.9 mg/dL — ABNORMAL HIGH (ref 1.7–2.4)

## 2019-10-07 LAB — FERRITIN: Ferritin: 336 ng/mL (ref 24–336)

## 2019-10-07 LAB — D-DIMER, QUANTITATIVE: D-Dimer, Quant: 1.82 ug/mL-FEU — ABNORMAL HIGH (ref 0.00–0.50)

## 2019-10-07 MED ORDER — INSULIN ASPART 100 UNIT/ML ~~LOC~~ SOLN
0.0000 [IU] | Freq: Three times a day (TID) | SUBCUTANEOUS | Status: DC
  Administered 2019-10-07: 11 [IU] via SUBCUTANEOUS
  Administered 2019-10-08: 20 [IU] via SUBCUTANEOUS
  Administered 2019-10-08: 5 [IU] via SUBCUTANEOUS

## 2019-10-07 MED ORDER — INSULIN DETEMIR 100 UNIT/ML ~~LOC~~ SOLN
10.0000 [IU] | Freq: Every day | SUBCUTANEOUS | Status: DC
  Administered 2019-10-08: 10 [IU] via SUBCUTANEOUS
  Filled 2019-10-07: qty 0.1

## 2019-10-07 MED ORDER — FUROSEMIDE 10 MG/ML IJ SOLN
60.0000 mg | Freq: Once | INTRAMUSCULAR | Status: AC
  Administered 2019-10-07: 60 mg via INTRAVENOUS
  Filled 2019-10-07: qty 6

## 2019-10-07 MED ORDER — CLONAZEPAM 0.5 MG PO TABS
0.5000 mg | ORAL_TABLET | Freq: Two times a day (BID) | ORAL | Status: DC | PRN
  Administered 2019-10-09 (×2): 0.5 mg via ORAL
  Filled 2019-10-07 (×2): qty 1

## 2019-10-07 NOTE — Progress Notes (Signed)
Inpatient Diabetes Program Recommendations  AACE/ADA: New Consensus Statement on Inpatient Glycemic Control (2015)  Target Ranges:  Prepandial:   less than 140 mg/dL      Peak postprandial:   less than 180 mg/dL (1-2 hours)      Critically ill patients:  140 - 180 mg/dL   Lab Results  Component Value Date   GLUCAP 318 (H) 10/07/2019   HGBA1C 8.1 (H) 10/02/2019    Review of Glycemic Control  Results for CHRISTOP, RUBEY (MRN [id number]) as of 10/07/2019 15:18  Ref. Range 10/07/2019 00:13 10/07/2019 04:41 10/07/2019 08:23 10/07/2019 11:18  Glucose-Capillary Latest Ref Range: 70 - 99 mg/dL 155 (H) 79 58 (L) 318 (H)     Current orders for Inpatient glycemic control: Levemir 15 BID + Novolog 0-20 Q4H + decadron 6mg  Q12  Inpatient Diabetes Program Recommendations:     Note: patient had a low this morning (58mg /dl)  -Please consider changing Novolog 0-20 TID with meals + 0-5 HS  Thank you, Geoffry Paradise, RN, BSN Diabetes Coordinator Inpatient Diabetes Program [phone] (team pager from 8a-5p)

## 2019-10-07 NOTE — Progress Notes (Signed)
Pt going to PCU -#[phone]. Pt cell phone, charger, and glasses are in basin with him.

## 2019-10-07 NOTE — Progress Notes (Signed)
Called and spoke with pt's wife and updated her on the pt's status. I answered her questions and addressed her concerns.

## 2019-10-07 NOTE — Progress Notes (Signed)
PROGRESS NOTE                                                                                                                                                                                                             Patient Demographics:    William [name], is a 63 y.o. male, DOB - 1956-02-24, [id number]  Outpatient Primary MD for the patient is [name], MD   Admit date - 09/26/2019   LOS - 4  Chief Complaint  Patient presents with   COVID positive   Shortness of Breath       Brief Narrative: Patient is a 63 y.o. male with PMHx of HTN, dyslipidemia, DM-2, CAD-who presented with shortness of breath and cough-he was found to have acute hypoxic respiratory failure secondary to COVID-19 pneumonia.  Significant events: 11/16>> admit to [facility] 11/17>> transferred to ICU for worsening hypoxemia requiring heated high flow 11/20>> transferred back to PCU   Subjective:    William [name] feels better-developed coughing spell and required to be placed back on 15 L of oxygen last night.  But feels he is overall better.   Assessment  & Plan :   Acute Hypoxic Resp Failure due to Covid 19 Viral pneumonia: Slowly improving-oxygen requirements are stable-slowly improving-no longer on heated high flow-has been stable on regular high flow oxygen for the past 48 hours.  Continue steroids/remdesivir.    Fever: afebrile  O2 requirements:  SpO2: 94 % O2 Flow Rate (L/min): 15 L/min FiO2 (%): 60 %   COVID-19 Labs: Recent Labs    10/05/19 0537 10/06/19 0442 10/07/19 0327  DDIMER 0.87* 1.15* 1.82*  FERRITIN 434* 344* 336  CRP 13.6* 7.3* 4.7*    Lab Results  Component Value Date   SARSCOV2NAA POSITIVE (A) 09/19/2019     COVID-19 Medications: Steroids:11/16>> Remdesivir:11/15>>11/19 Actemra:11/17>>x 1 Convalescent Plasma:11/17>>x1  Other medications: Diuretics:Euvolemic-repeat Lasix x1 to maintain  negative balance Antibiotics:Not needed as no evidence of bacterial infection  Prone/Incentive Spirometry: encouraged patient to lie prone for 3-4 hours at a time for a total of 16 hours a day, and to encourage incentive spirometry use 3-4/hour.  DVT Prophylaxis  :  Lovenox   AKI: Improved-suspect may have developed some mild CKD stage III at baseline-we will wait to see where creatinine settles down over the next few days.  Follow.  Chronic diastolic heart failure: Compensated-on almost daily dosing of Lasix depending on volume status.  DM-2 (A1c 8.1 on 10/02/2019): Hypoglycemic episode this morning-change Levemir to 10 units daily dosing-change SSI to moderate scale.  Follow and adjust.  CBG (last 3)  Recent Labs    10/07/19 0441 10/07/19 0823 10/07/19 1118  GLUCAP 79 58* 318*    HTN: Controlled-continue metoprolol  Dyslipidemia: Continue statin  History of CAD: No anginal symptoms-continue dual antiplatelet agents, statin and metoprolol  Remote history of DVT: Doppler on 11/18 negative-on prophylactic dose of Lovenox  Obesity: Estimated body mass index is 35.24 kg/m as calculated from the following:   Height as of this encounter: 5\' 7"  (1.702 m).   Weight as of this encounter: 102.1 kg.     GI prophylaxis:H2 Blocker  Consults  :  PCCM  Procedures  :  None  ABG: No results found for: PHART, PCO2ART, PO2ART, HCO3, TCO2, ACIDBASEDEF, O2SAT  Vent Settings: N/A  Condition - Guarded  Family Communication  : spouse on 11/20  Code Status :  Full Code  Diet :  Diet Order            Diet Carb Modified Fluid consistency: Thin; Room service appropriate? Yes  Diet effective now               Disposition Plan  :  Remain hospitalized-suspect can be transferred to PCU later today if he remains stable on regular high flow.  Barriers to discharge: Hypoxia requiring O2 supplementation/complete 5 days of IV Remdesivir  Antimicorbials  :    Anti-infectives (From  admission, onward)   Start     Dose/Rate Route Frequency Ordered Stop   10/04/19 1600  remdesivir 100 mg in sodium chloride 0.9 % 250 mL IVPB     100 mg 500 mL/hr over 30 Minutes Intravenous Daily 09/23/2019 2358 10/06/19 1140   10/04/2019 2200  remdesivir 100 mg in sodium chloride 0.9 % 250 mL IVPB  Status:  Discontinued     100 mg 500 mL/hr over 30 Minutes Intravenous Every 24 hours 09/29/2019 1537 10/04/2019 2358      Inpatient Medications  Scheduled Meds:  aspirin EC  81 mg Oral Daily   atorvastatin  40 mg Oral q1800   Chlorhexidine Gluconate Cloth  6 each Topical Daily   dexamethasone (DECADRON) injection  6 mg Intravenous Q12H   enoxaparin (LOVENOX) injection  0.5 mg/kg Subcutaneous Q12H   famotidine  20 mg Oral Daily   insulin aspart  0-15 Units Subcutaneous TID WC   [START ON 10/08/2019] insulin detemir  10 Units Subcutaneous Daily   Ipratropium-Albuterol  2 puff Inhalation Q6H   metoprolol tartrate  12.5 mg Oral BID   multivitamin with minerals  1 tablet Oral Daily   ticagrelor  90 mg Oral BID   vitamin B-12  100 mcg Oral Daily   vitamin C  500 mg Oral Daily   zinc sulfate  220 mg Oral Daily   Continuous Infusions:  PRN Meds:.acetaminophen, clonazePAM, lip balm, nitroGLYCERIN, ondansetron **OR** ondansetron (ZOFRAN) IV, sodium chloride, tetrahydrozoline  Time Spent in minutes  35   See all Orders from today for further details  Oren Binet M.D on 10/07/2019 at 3:58 PM  To page go to [email or web address] - use universal password  [facility] -  Office  [phone]    Objective:   Vitals:   10/07/19 0900 10/07/19 1100 10/07/19 1200 10/07/19 1521  BP: 130/60 133/61 138/64   Pulse:  60 (!) 58  Resp:  (!) 31 (!) 32   Temp:   98 F (36.7 C)   TempSrc:   Oral   SpO2:  99% 96% 94%  Weight:      Height:        Wt Readings from Last 3 Encounters:  10/02/2019 102.1 kg  09/23/2019 102.1 kg  03/30/14 97.5 kg     Intake/Output Summary (Last 24  hours) at 10/07/2019 1558 Last data filed at 10/07/2019 1500 Gross per 24 hour  Intake 1360 ml  Output 5300 ml  Net -3940 ml     Physical Exam Gen Exam:Alert awake-not in any distress HEENT:atraumatic, normocephalic Chest: B/L clear to auscultation anteriorly CVS:S1S2 regular Abdomen:soft non tender, non distended Extremities:no edema Neurology: Non focal Skin: no rash   Data Review:    CBC Recent Labs  Lab 09/27/2019 1340 10/04/19 0600 10/05/19 0537 10/06/19 0442 10/07/19 0327  WBC 9.6 9.8 10.6* 11.6* 10.4  HGB 13.0 12.6* 12.7* 12.5* 12.8*  HCT 40.9 39.9 39.4 38.4* 39.8  PLT 276 370 408* 415* 463*  MCV 79.9* 79.0* 77.7* 78.4* 78.0*  MCH 25.4* 25.0* 25.0* 25.5* 25.1*  MCHC 31.8 31.6 32.2 32.6 32.2  RDW 14.7 14.8 14.6 14.6 14.2  LYMPHSABS 0.6* 0.4* 0.8 0.6* 0.6*  MONOABS 0.7 0.3 0.7 0.6 0.5  EOSABS 0.0 0.0 0.0 0.0 0.0  BASOSABS 0.0 0.0 0.0 0.0 0.0    Chemistries  Recent Labs  Lab 10/05/2019 1340 10/04/19 0600 10/05/19 0537 10/06/19 0442 10/07/19 0327  NA 136 147* 145 141 142  K 4.6 5.0 4.5 4.5 4.2  CL 104 111 107 107 106  CO2 17* 23 25 22 23   GLUCOSE 314* 229* 72 109* 86  BUN 38* 44* 53* 50* 51*  CREATININE 1.75* 1.62* 1.53* 1.33* 1.42*  CALCIUM 7.7* 8.1* 8.7* 8.4* 8.6*  MG  --  3.7* 3.7* 3.2* 2.9*  AST 32 26 32 53* 40  ALT 21 17 18 30  35  ALKPHOS 38 44 51 63 81  BILITOT 0.5 0.4 0.4 0.4 0.6   ------------------------------------------------------------------------------------------------------------------ No results for input(s): CHOL, HDL, LDLCALC, TRIG, CHOLHDL, LDLDIRECT in the last 72 hours.  Lab Results  Component Value Date   HGBA1C 8.1 (H) 10/15/2019   ------------------------------------------------------------------------------------------------------------------ No results for input(s): TSH, T4TOTAL, T3FREE, THYROIDAB in the last 72 hours.  Invalid input(s):  FREET3 ------------------------------------------------------------------------------------------------------------------ Recent Labs    10/06/19 0442 10/07/19 0327  FERRITIN 344* 336    Coagulation profile No results for input(s): INR, PROTIME in the last 168 hours.  Recent Labs    10/06/19 0442 10/07/19 0327  DDIMER 1.15* 1.82*    Cardiac Enzymes No results for input(s): CKMB, TROPONINI, MYOGLOBIN in the last 168 hours.  Invalid input(s): CK ------------------------------------------------------------------------------------------------------------------    Component Value Date/Time   BNP 26.7 09/21/2019 1809    Micro Results Recent Results (from the past 240 hour(s))  SARS CORONAVIRUS 2 (TAT 6-24 HRS) Nasopharyngeal Nasopharyngeal Swab     Status: Abnormal   Collection Time: 09/30/2019  6:09 PM   Specimen: Nasopharyngeal Swab  Result Value Ref Range Status   SARS Coronavirus 2 POSITIVE (A) NEGATIVE Final    Comment: RESULT CALLED TO, READ BACK BY AND VERIFIED WITH: Herby Abraham, RN AT 2324 ON 09/25/2019 BY SAINVILUS S (NOTE) SARS-CoV-2 target nucleic acids are DETECTED. The SARS-CoV-2 RNA is generally detectable in upper and lower respiratory specimens during the acute phase of infection. Positive results are indicative of active infection with SARS-CoV-2. Clinical  correlation with patient history and other  diagnostic information is necessary to determine patient infection status. Positive results do  not rule out bacterial infection or co-infection with other viruses. The expected result is Negative. Fact Sheet for Patients: SugarRoll.be Fact Sheet for Healthcare Providers: https://www.woods-mathews.com/ This test is not yet approved or cleared by the [place] FDA and  has been authorized for detection and/or diagnosis of SARS-CoV-2 by FDA under an Emergency Use Authorization (EUA). This EUA will remain  in effect  (meaning this test can  be used) for the duration of the COVID-19 declaration under Section 564(b)(1) of the Act, 21 U.S.C. section 360bbb-3(b)(1), unless the authorization is terminated or revoked sooner. Performed at [facility], [place] [address], [place], [place] [address]   Blood culture (routine x 2)     Status: None (Preliminary result)   Collection Time: 09/25/2019  1:40 PM   Specimen: BLOOD  Result Value Ref Range Status   Specimen Description   Final    BLOOD RIGHT ANTECUBITAL Performed at [facility], [place] [address], [place], [place] [address]    Special Requests   Final    BOTTLES DRAWN AEROBIC AND ANAEROBIC Blood Culture adequate volume Performed at [place] [address], [place], [place] [address]    Culture   Final    NO GROWTH 4 DAYS Performed at [facility], [place] [address], [place], [place] [address]    Report Status PENDING  Incomplete  Blood culture (routine x 2)     Status: None (Preliminary result)   Collection Time: 10/02/2019  1:45 PM   Specimen: BLOOD RIGHT WRIST  Result Value Ref Range Status   Specimen Description   Final    BLOOD RIGHT WRIST Performed at [facility] [address] [place], [place] [address]    Special Requests   Final    BOTTLES DRAWN AEROBIC AND ANAEROBIC Blood Culture adequate volume Performed at [place] [address], [place], [place] [address]    Culture   Final    NO GROWTH 4 DAYS Performed at [facility], [place] [address] [place], [place] [address]    Report Status PENDING  Incomplete  MRSA PCR Screening     Status: None   Collection Time: 10/05/19  6:39 PM   Specimen: Nasopharyngeal  Result Value Ref Range Status   MRSA by PCR NEGATIVE NEGATIVE Final    Comment:        The GeneXpert MRSA Assay (FDA approved for NASAL specimens only), is one component of a comprehensive MRSA colonization surveillance program. It is  not intended to diagnose MRSA infection nor to guide or monitor treatment for MRSA infections. Performed at [facility], [place] [address] [place], [place] [address]     Radiology Reports Dg Chest Port 1 View  Result Date: 10/04/2019 CLINICAL DATA:  Hypoxia EXAM: PORTABLE CHEST 1 VIEW COMPARISON:  10/17/2019 FINDINGS: Interstitial/patchy opacities in the bilateral upper and lower lobes, unchanged. No definite pleural effusions. No pneumothorax. The heart is top-normal in size. IMPRESSION: Multifocal pneumonia in this patient with known COVID, unchanged. Electronically Signed   By: Julian Hy M.D.   On: 10/04/2019 10:07   Dg Chest Portable 1 View  Result Date: 10/11/2019 CLINICAL DATA:  Hypoxia, shortness of breath. EXAM: PORTABLE CHEST 1 VIEW COMPARISON:  October 02, 2019. FINDINGS: The heart size and mediastinal contours are within normal limits. No pneumothorax or pleural effusion is noted. Stable left perihilar  and lingular opacity is noted as well as mild right upper lobe opacity consistent with multifocal pneumonia. The visualized skeletal structures are unremarkable. IMPRESSION: Stable bilateral lung opacities are noted, left greater than right, consistent with multifocal pneumonia. Electronically Signed   By: Marijo Conception M.D.   On: 09/22/2019 14:14   Dg Chest Port 1 View  Result Date: 10/05/2019 CLINICAL DATA:  Pt complains of chest pain x 3 days, non-radiating, worse with inspiration, cough, fever, and generally feeling unwell. Pt was unable to hold deep inspiration for x-ray due to coughing. EXAM: PORTABLE CHEST 1 VIEW COMPARISON:  01/07/2014 FINDINGS: Hazy interstitial and airspace opacities present in the left perihilar region, right upper lobe, and left lower lobe. Low lung volumes are present, causing crowding of the pulmonary vasculature. No blunting of the costophrenic angles. Cardiac and mediastinal margins appear normal. IMPRESSION: 1. Hazy  interstitial and airspace opacities in the left perihilar region, right upper lobe, and left lower lobe, suspicious for multilobar pneumonia or atypical pneumonia. 2. Low lung volumes. Electronically Signed   By: Van Clines M.D.   On: 09/20/2019 18:57   Le Venous  Result Date: 10/04/2019  Lower Venous Study Indications: Covid-19 positive, and Swelling.  Anticoagulation: Lovenox. Comparison Study: No priors Performing Technologist: Velva Harman Sturdivant RDMS, RVT  Examination Guidelines: A complete evaluation includes B-mode imaging, spectral Doppler, color Doppler, and power Doppler as needed of all accessible portions of each vessel. Bilateral testing is considered an integral part of a complete examination. Limited examinations for reoccurring indications may be performed as noted.  +---------+---------------+---------+-----------+----------+--------------+  RIGHT     Compressibility Phasicity Spontaneity Properties Thrombus Aging  +---------+---------------+---------+-----------+----------+--------------+  CFV       Full            Yes       Yes                                    +---------+---------------+---------+-----------+----------+--------------+  SFJ       Full                                                             +---------+---------------+---------+-----------+----------+--------------+  FV Prox   Full                                                             +---------+---------------+---------+-----------+----------+--------------+  FV Mid    Full                                                             +---------+---------------+---------+-----------+----------+--------------+  FV Distal Full                                                             +---------+---------------+---------+-----------+----------+--------------+  PFV       Full                                                             +---------+---------------+---------+-----------+----------+--------------+  POP        Full            No        Yes                                    +---------+---------------+---------+-----------+----------+--------------+  PTV       Full                                                             +---------+---------------+---------+-----------+----------+--------------+  PERO      Full                                                             +---------+---------------+---------+-----------+----------+--------------+   +---------+---------------+---------+-----------+----------+--------------+  LEFT      Compressibility Phasicity Spontaneity Properties Thrombus Aging  +---------+---------------+---------+-----------+----------+--------------+  CFV       Full            Yes       Yes                                    +---------+---------------+---------+-----------+----------+--------------+  SFJ       Full                                                             +---------+---------------+---------+-----------+----------+--------------+  FV Prox   Full                                                             +---------+---------------+---------+-----------+----------+--------------+  FV Mid    Full                                                             +---------+---------------+---------+-----------+----------+--------------+  FV Distal Full                                                             +---------+---------------+---------+-----------+----------+--------------+  PFV       Full                                                             +---------+---------------+---------+-----------+----------+--------------+  POP       Full            Yes       Yes                                    +---------+---------------+---------+-----------+----------+--------------+  PTV       Full                                                             +---------+---------------+---------+-----------+----------+--------------+  PERO      Full                                                              +---------+---------------+---------+-----------+----------+--------------+     Summary: Right: There is no evidence of deep vein thrombosis in the lower extremity. No cystic structure found in the popliteal fossa. Left: There is no evidence of deep vein thrombosis in the lower extremity. No cystic structure found in the popliteal fossa.  *See table(s) above for measurements and observations. Electronically signed by Ruta Hinds MD on 10/04/2019 at 3:31:58 PM.    Final

## 2019-10-07 NOTE — Progress Notes (Signed)
Spoke with [name]- pt wife to give update. She is apprciative of our care here at [facility].

## 2019-10-08 LAB — COMPREHENSIVE METABOLIC PANEL
ALT: 30 U/L (ref 0–44)
AST: 29 U/L (ref 15–41)
Albumin: 3.2 g/dL — ABNORMAL LOW (ref 3.5–5.0)
Alkaline Phosphatase: 99 U/L (ref 38–126)
Anion gap: 12 (ref 5–15)
BUN: 53 mg/dL — ABNORMAL HIGH (ref 8–23)
CO2: 23 mmol/L (ref 22–32)
Calcium: 8.5 mg/dL — ABNORMAL LOW (ref 8.9–10.3)
Chloride: 102 mmol/L (ref 98–111)
Creatinine, Ser: 1.52 mg/dL — ABNORMAL HIGH (ref 0.61–1.24)
GFR calc Af Amer: 56 mL/min — ABNORMAL LOW (ref >60)
GFR calc non Af Amer: 48 mL/min — ABNORMAL LOW (ref >60)
Glucose, Bld: 312 mg/dL — ABNORMAL HIGH (ref 70–99)
Potassium: 4.6 mmol/L (ref 3.5–5.1)
Sodium: 137 mmol/L (ref 135–145)
Total Bilirubin: 0.9 mg/dL (ref 0.3–1.2)
Total Protein: 6.9 g/dL (ref 6.5–8.1)

## 2019-10-08 LAB — CBC WITH DIFFERENTIAL/PLATELET
Abs Immature Granulocytes: 0.16 10*3/uL — ABNORMAL HIGH (ref 0.00–0.07)
Basophils Absolute: 0 10*3/uL (ref 0.0–0.1)
Basophils Relative: 0 %
Eosinophils Absolute: 0 10*3/uL (ref 0.0–0.5)
Eosinophils Relative: 0 %
HCT: 39.2 % (ref 39.0–52.0)
Hemoglobin: 12.6 g/dL — ABNORMAL LOW (ref 13.0–17.0)
Immature Granulocytes: 2 %
Lymphocytes Relative: 4 %
Lymphs Abs: 0.5 10*3/uL — ABNORMAL LOW (ref 0.7–4.0)
MCH: 25 pg — ABNORMAL LOW (ref 26.0–34.0)
MCHC: 32.1 g/dL (ref 30.0–36.0)
MCV: 77.8 fL — ABNORMAL LOW (ref 80.0–100.0)
Monocytes Absolute: 0.3 10*3/uL (ref 0.1–1.0)
Monocytes Relative: 2 %
Neutro Abs: 10 10*3/uL — ABNORMAL HIGH (ref 1.7–7.7)
Neutrophils Relative %: 92 %
Platelets: 456 10*3/uL — ABNORMAL HIGH (ref 150–400)
RBC: 5.04 MIL/uL (ref 4.22–5.81)
RDW: 14.2 % (ref 11.5–15.5)
WBC: 11 10*3/uL — ABNORMAL HIGH (ref 4.0–10.5)
nRBC: 0.7 % — ABNORMAL HIGH (ref 0.0–0.2)

## 2019-10-08 LAB — C-REACTIVE PROTEIN: CRP: 2.7 mg/dL — ABNORMAL HIGH (ref <1.0)

## 2019-10-08 LAB — GLUCOSE, CAPILLARY
Glucose-Capillary: 250 mg/dL — ABNORMAL HIGH (ref 70–99)
Glucose-Capillary: 453 mg/dL — ABNORMAL HIGH (ref 70–99)
Glucose-Capillary: 391 mg/dL — ABNORMAL HIGH (ref 70–99)
Glucose-Capillary: 282 mg/dL — ABNORMAL HIGH (ref 70–99)

## 2019-10-08 LAB — CULTURE, BLOOD (ROUTINE X 2)
Culture: NO GROWTH
Culture: NO GROWTH
Special Requests: ADEQUATE
Special Requests: ADEQUATE

## 2019-10-08 LAB — MAGNESIUM: Magnesium: 2.8 mg/dL — ABNORMAL HIGH (ref 1.7–2.4)

## 2019-10-08 LAB — FERRITIN: Ferritin: 314 ng/mL (ref 24–336)

## 2019-10-08 LAB — D-DIMER, QUANTITATIVE: D-Dimer, Quant: 2.49 ug/mL-FEU — ABNORMAL HIGH (ref 0.00–0.50)

## 2019-10-08 MED ORDER — ENOXAPARIN SODIUM 60 MG/0.6ML ~~LOC~~ SOLN
50.0000 mg | SUBCUTANEOUS | Status: DC
  Administered 2019-10-08: 50 mg via SUBCUTANEOUS
  Filled 2019-10-08: qty 0.6

## 2019-10-08 MED ORDER — INSULIN DETEMIR 100 UNIT/ML ~~LOC~~ SOLN
15.0000 [IU] | Freq: Two times a day (BID) | SUBCUTANEOUS | Status: DC
  Administered 2019-10-08 – 2019-10-09 (×3): 15 [IU] via SUBCUTANEOUS
  Filled 2019-10-08 (×4): qty 0.15

## 2019-10-08 MED ORDER — DEXAMETHASONE SODIUM PHOSPHATE 10 MG/ML IJ SOLN
6.0000 mg | INTRAMUSCULAR | Status: DC
  Administered 2019-10-09 – 2019-10-10 (×2): 6 mg via INTRAVENOUS
  Filled 2019-10-08 (×2): qty 1

## 2019-10-08 MED ORDER — INSULIN ASPART 100 UNIT/ML ~~LOC~~ SOLN
0.0000 [IU] | Freq: Three times a day (TID) | SUBCUTANEOUS | Status: DC
  Administered 2019-10-08 – 2019-10-09 (×3): 20 [IU] via SUBCUTANEOUS

## 2019-10-08 MED ORDER — INSULIN ASPART 100 UNIT/ML ~~LOC~~ SOLN
4.0000 [IU] | Freq: Three times a day (TID) | SUBCUTANEOUS | Status: DC
  Administered 2019-10-08 – 2019-10-09 (×4): 4 [IU] via SUBCUTANEOUS

## 2019-10-08 NOTE — Progress Notes (Signed)
PROGRESS NOTE                                                                                                                                                                                                             Patient Demographics:    William [name], is a 63 y.o. male, DOB - May 20, 1956, [id number]  Outpatient Primary MD for the patient is [name], MD   Admit date - 10/07/2019   LOS - 5  Chief Complaint  Patient presents with   COVID positive   Shortness of Breath       Brief Narrative: Patient is a 63 y.o. male with PMHx of HTN, dyslipidemia, DM-2, CAD-who presented with shortness of breath and cough-he was found to have acute hypoxic respiratory failure secondary to COVID-19 pneumonia.  Significant events: 11/16>> admit to [facility] 11/17>> transferred to ICU for worsening hypoxemia requiring heated high flow 11/20>> transferred back to PCU  COVID-19 Medications: Steroids:11/16>> Remdesivir:11/15>>11/19 Actemra:11/17>>x 1 Convalescent Plasma:11/17>>x1   Subjective:    [name] feels better-he is down to 6 L of high flow O2 today.   Assessment  & Plan :   Acute Hypoxic Resp Failure due to Covid 19 Viral pneumonia: Slow clinical improvement continues-change Decadron to daily dosing, has finished a course of remdesivir.    Fever: afebrile  O2 requirements:  SpO2: 95 % O2 Flow Rate (L/min): 6 L/min FiO2 (%): 60 %   COVID-19 Labs: Recent Labs    10/06/19 0442 10/07/19 0327 10/08/19 0140  DDIMER 1.15* 1.82* 2.49*  FERRITIN 344* 336 314  CRP 7.3* 4.7* 2.7*    Lab Results  Component Value Date   SARSCOV2NAA POSITIVE (A) 09/26/2019     Other medications: Diuretics:Euvolemic-hold Lasix today (-14 L so far) Antibiotics:Not needed as no evidence of bacterial infection  Prone/Incentive Spirometry: encouraged patient to lie prone for 3-4 hours at a time for a total of 16  hours a day, and to encourage incentive spirometry use 3-4/hour.  DVT Prophylaxis  :  Lovenox   AKI: Improved-suspect may have developed some mild CKD stage III at baseline-we will wait to see where creatinine settles down over the next few days.  Follow.    Chronic diastolic heart failure: Compensated-holding Lasix today-has received as needed doses of Lasix over the past few days.  DM-2 (A1c 8.1 on  09/30/2019): Hypoglycemic episode on 11/20-but now  CBGs are uncontrolled-change Levemir to 15 units twice daily, change SSI to resistant scale-add 4 units of NovoLog with meals.  Follow and adjust.    CBG (last 3)  Recent Labs    10/07/19 2010 10/08/19 0751 10/08/19 1216  GLUCAP 392* 250* 453*    HTN: Controlled-continue metoprolol  Dyslipidemia: Continue statin  History of CAD: No anginal symptoms-continue dual antiplatelet agents, statin and metoprolol  Remote history of DVT: Doppler on 11/18 negative-on prophylactic dose of Lovenox  Obesity: Estimated body mass index is 35.24 kg/m as calculated from the following:   Height as of this encounter: 5\' 7"  (1.702 m).   Weight as of this encounter: 102.1 kg.     GI prophylaxis:H2 Blocker  Consults  :  PCCM  Procedures  :  None  ABG: No results found for: PHART, PCO2ART, PO2ART, HCO3, TCO2, ACIDBASEDEF, O2SAT  Vent Settings: N/A  Condition - Guarded  Family Communication  : spouse on 11/21  Code Status :  Full Code  Diet :  Diet Order            Diet Carb Modified Fluid consistency: Thin; Room service appropriate? Yes  Diet effective now               Disposition Plan  :  Remain hospitalized-suspect can be transferred to PCU later today if he remains stable on regular high flow.  Barriers to discharge: Hypoxia requiring O2 supplementation/complete 5 days of IV Remdesivir  Antimicorbials  :    Anti-infectives (From admission, onward)   Start     Dose/Rate Route Frequency Ordered Stop   10/04/19 1600   remdesivir 100 mg in sodium chloride 0.9 % 250 mL IVPB     100 mg 500 mL/hr over 30 Minutes Intravenous Daily 10/06/2019 2358 10/06/19 1140   10/05/2019 2200  remdesivir 100 mg in sodium chloride 0.9 % 250 mL IVPB  Status:  Discontinued     100 mg 500 mL/hr over 30 Minutes Intravenous Every 24 hours 10/08/2019 1537 09/18/2019 2358      Inpatient Medications  Scheduled Meds:  aspirin EC  81 mg Oral Daily   atorvastatin  40 mg Oral q1800   Chlorhexidine Gluconate Cloth  6 each Topical Daily   dexamethasone (DECADRON) injection  6 mg Intravenous Q12H   enoxaparin (LOVENOX) injection  50 mg Subcutaneous Q24H   famotidine  20 mg Oral Daily   insulin aspart  0-20 Units Subcutaneous TID WC   insulin aspart  4 Units Subcutaneous TID WC   insulin detemir  15 Units Subcutaneous BID   Ipratropium-Albuterol  2 puff Inhalation Q6H   metoprolol tartrate  12.5 mg Oral BID   multivitamin with minerals  1 tablet Oral Daily   ticagrelor  90 mg Oral BID   vitamin B-12  100 mcg Oral Daily   vitamin C  500 mg Oral Daily   zinc sulfate  220 mg Oral Daily   Continuous Infusions:  PRN Meds:.acetaminophen, clonazePAM, lip balm, nitroGLYCERIN, ondansetron **OR** ondansetron (ZOFRAN) IV, sodium chloride, tetrahydrozoline  Time Spent in minutes  25   See all Orders from today for further details  Oren Binet M.D on 10/08/2019 at 1:05 PM  To page go to [email or web address] - use universal password  [facility] -  Office  [phone]    Objective:   Vitals:   10/08/19 0800 10/08/19 1000 10/08/19 1051 10/08/19 1221  BP:    (!) 145/51  Pulse: Marland Kitchen)  58 77 69 69  Resp: (!) 22 20 (!) 25 (!) 25  Temp:    (!) 97.1 F (36.2 C)  TempSrc:    Oral  SpO2: 94% 98% 100% 95%  Weight:      Height:        Wt Readings from Last 3 Encounters:  10/02/2019 102.1 kg  09/22/2019 102.1 kg  03/30/14 97.5 kg     Intake/Output Summary (Last 24 hours) at 10/08/2019 1305 Last data filed at 10/08/2019  1222 Gross per 24 hour  Intake 960 ml  Output 3150 ml  Net -2190 ml     Physical Exam Gen Exam:Alert awake-not in any distress HEENT:atraumatic, normocephalic Chest: B/L clear to auscultation anteriorly CVS:S1S2 regular Abdomen:soft non tender, non distended Extremities:no edema Neurology: Non focal Skin: no rash   Data Review:    CBC Recent Labs  Lab 10/04/19 0600 10/05/19 0537 10/06/19 0442 10/07/19 0327 10/08/19 0140  WBC 9.8 10.6* 11.6* 10.4 11.0*  HGB 12.6* 12.7* 12.5* 12.8* 12.6*  HCT 39.9 39.4 38.4* 39.8 39.2  PLT 370 408* 415* 463* 456*  MCV 79.0* 77.7* 78.4* 78.0* 77.8*  MCH 25.0* 25.0* 25.5* 25.1* 25.0*  MCHC 31.6 32.2 32.6 32.2 32.1  RDW 14.8 14.6 14.6 14.2 14.2  LYMPHSABS 0.4* 0.8 0.6* 0.6* 0.5*  MONOABS 0.3 0.7 0.6 0.5 0.3  EOSABS 0.0 0.0 0.0 0.0 0.0  BASOSABS 0.0 0.0 0.0 0.0 0.0    Chemistries  Recent Labs  Lab 10/04/19 0600 10/05/19 0537 10/06/19 0442 10/07/19 0327 10/08/19 0140  NA 147* 145 141 142 137  K 5.0 4.5 4.5 4.2 4.6  CL 111 107 107 106 102  CO2 23 25 22 23 23   GLUCOSE 229* 72 109* 86 312*  BUN 44* 53* 50* 51* 53*  CREATININE 1.62* 1.53* 1.33* 1.42* 1.52*  CALCIUM 8.1* 8.7* 8.4* 8.6* 8.5*  MG 3.7* 3.7* 3.2* 2.9* 2.8*  AST 26 32 53* 40 29  ALT 17 18 30  35 30  ALKPHOS 44 51 63 81 99  BILITOT 0.4 0.4 0.4 0.6 0.9   ------------------------------------------------------------------------------------------------------------------ No results for input(s): CHOL, HDL, LDLCALC, TRIG, CHOLHDL, LDLDIRECT in the last 72 hours.  Lab Results  Component Value Date   HGBA1C 8.1 (H) 10/10/2019   ------------------------------------------------------------------------------------------------------------------ No results for input(s): TSH, T4TOTAL, T3FREE, THYROIDAB in the last 72 hours.  Invalid input(s): FREET3 ------------------------------------------------------------------------------------------------------------------ Recent  Labs    10/07/19 0327 10/08/19 0140  FERRITIN 336 314    Coagulation profile No results for input(s): INR, PROTIME in the last 168 hours.  Recent Labs    10/07/19 0327 10/08/19 0140  DDIMER 1.82* 2.49*    Cardiac Enzymes No results for input(s): CKMB, TROPONINI, MYOGLOBIN in the last 168 hours.  Invalid input(s): CK ------------------------------------------------------------------------------------------------------------------    Component Value Date/Time   BNP 26.7 09/28/2019 1809    Micro Results Recent Results (from the past 240 hour(s))  SARS CORONAVIRUS 2 (TAT 6-24 HRS) Nasopharyngeal Nasopharyngeal Swab     Status: Abnormal   Collection Time: 10/10/2019  6:09 PM   Specimen: Nasopharyngeal Swab  Result Value Ref Range Status   SARS Coronavirus 2 POSITIVE (A) NEGATIVE Final    Comment: RESULT CALLED TO, READ BACK BY AND VERIFIED WITH: Herby Abraham, RN AT 2324 ON 09/26/2019 BY SAINVILUS S (NOTE) SARS-CoV-2 target nucleic acids are DETECTED. The SARS-CoV-2 RNA is generally detectable in upper and lower respiratory specimens during the acute phase of infection. Positive results are indicative of active infection with SARS-CoV-2. Clinical  correlation  with patient history and other diagnostic information is necessary to determine patient infection status. Positive results do  not rule out bacterial infection or co-infection with other viruses. The expected result is Negative. Fact Sheet for Patients: SugarRoll.be Fact Sheet for Healthcare Providers: https://www.woods-mathews.com/ This test is not yet approved or cleared by the [place] FDA and  has been authorized for detection and/or diagnosis of SARS-CoV-2 by FDA under an Emergency Use Authorization (EUA). This EUA will remain  in effect (meaning this test can  be used) for the duration of the COVID-19 declaration under Section 564(b)(1) of the Act, 21 U.S.C. section  360bbb-3(b)(1), unless the authorization is terminated or revoked sooner. Performed at [facility], [place] [address] [place], [place] [address]   Blood culture (routine x 2)     Status: None   Collection Time: 10/05/2019  1:40 PM   Specimen: BLOOD  Result Value Ref Range Status   Specimen Description   Final    BLOOD RIGHT ANTECUBITAL Performed at [facility], [place] [address] [place], [place] [address]    Special Requests   Final    BOTTLES DRAWN AEROBIC AND ANAEROBIC Blood Culture adequate volume Performed at [facility] [address], [place], [place] [address]    Culture   Final    NO GROWTH 5 DAYS Performed at [facility], [place] [address], [place], [place] [address]    Report Status 10/08/2019 FINAL  Final  Blood culture (routine x 2)     Status: None   Collection Time: 10/02/2019  1:45 PM   Specimen: BLOOD RIGHT WRIST  Result Value Ref Range Status   Specimen Description   Final    BLOOD RIGHT WRIST Performed at [facility] [address] [place], [place] [address]    Special Requests   Final    BOTTLES DRAWN AEROBIC AND ANAEROBIC Blood Culture adequate volume Performed at [facility] [address], [place], [place] [address]    Culture   Final    NO GROWTH 5 DAYS Performed at [facility], [place] [address], [place], [place] [address]    Report Status 10/08/2019 FINAL  Final  MRSA PCR Screening     Status: None   Collection Time: 10/05/19  6:39 PM   Specimen: Nasopharyngeal  Result Value Ref Range Status   MRSA by PCR NEGATIVE NEGATIVE Final    Comment:        The GeneXpert MRSA Assay (FDA approved for NASAL specimens only), is one component of a comprehensive MRSA colonization surveillance program. It is not intended to diagnose MRSA infection nor to guide or monitor treatment for MRSA infections. Performed at [facility], [place] [address],  [place], [place] [address]     Radiology Reports Dg Chest Port 1 View  Result Date: 10/04/2019 CLINICAL DATA:  Hypoxia EXAM: PORTABLE CHEST 1 VIEW COMPARISON:  10/12/2019 FINDINGS: Interstitial/patchy opacities in the bilateral upper and lower lobes, unchanged. No definite pleural effusions. No pneumothorax. The heart is top-normal in size. IMPRESSION: Multifocal pneumonia in this patient with known COVID, unchanged. Electronically Signed   By: Julian Hy M.D.   On: 10/04/2019 10:07   Dg Chest Portable 1 View  Result Date: 10/11/2019 CLINICAL DATA:  Hypoxia, shortness of breath. EXAM: PORTABLE CHEST 1 VIEW COMPARISON:  October 02, 2019. FINDINGS: The heart size and mediastinal contours are within normal limits. No pneumothorax or pleural effusion is noted.  Stable left perihilar and lingular opacity is noted as well as mild right upper lobe opacity consistent with multifocal pneumonia. The visualized skeletal structures are unremarkable. IMPRESSION: Stable bilateral lung opacities are noted, left greater than right, consistent with multifocal pneumonia. Electronically Signed   By: Marijo Conception M.D.   On: 09/20/2019 14:14   Dg Chest Port 1 View  Result Date: 10/10/2019 CLINICAL DATA:  Pt complains of chest pain x 3 days, non-radiating, worse with inspiration, cough, fever, and generally feeling unwell. Pt was unable to hold deep inspiration for x-ray due to coughing. EXAM: PORTABLE CHEST 1 VIEW COMPARISON:  01/07/2014 FINDINGS: Hazy interstitial and airspace opacities present in the left perihilar region, right upper lobe, and left lower lobe. Low lung volumes are present, causing crowding of the pulmonary vasculature. No blunting of the costophrenic angles. Cardiac and mediastinal margins appear normal. IMPRESSION: 1. Hazy interstitial and airspace opacities in the left perihilar region, right upper lobe, and left lower lobe, suspicious for multilobar pneumonia or atypical pneumonia. 2. Low  lung volumes. Electronically Signed   By: Van Clines M.D.   On: 10/03/2019 18:57   Le Venous  Result Date: 10/04/2019  Lower Venous Study Indications: Covid-19 positive, and Swelling.  Anticoagulation: Lovenox. Comparison Study: No priors Performing Technologist: Velva Harman Sturdivant RDMS, RVT  Examination Guidelines: A complete evaluation includes B-mode imaging, spectral Doppler, color Doppler, and power Doppler as needed of all accessible portions of each vessel. Bilateral testing is considered an integral part of a complete examination. Limited examinations for reoccurring indications may be performed as noted.  +---------+---------------+---------+-----------+----------+--------------+  RIGHT     Compressibility Phasicity Spontaneity Properties Thrombus Aging  +---------+---------------+---------+-----------+----------+--------------+  CFV       Full            Yes       Yes                                    +---------+---------------+---------+-----------+----------+--------------+  SFJ       Full                                                             +---------+---------------+---------+-----------+----------+--------------+  FV Prox   Full                                                             +---------+---------------+---------+-----------+----------+--------------+  FV Mid    Full                                                             +---------+---------------+---------+-----------+----------+--------------+  FV Distal Full                                                             +---------+---------------+---------+-----------+----------+--------------+  PFV       Full                                                             +---------+---------------+---------+-----------+----------+--------------+  POP       Full            No        Yes                                    +---------+---------------+---------+-----------+----------+--------------+  PTV       Full                                                              +---------+---------------+---------+-----------+----------+--------------+  PERO      Full                                                             +---------+---------------+---------+-----------+----------+--------------+   +---------+---------------+---------+-----------+----------+--------------+  LEFT      Compressibility Phasicity Spontaneity Properties Thrombus Aging  +---------+---------------+---------+-----------+----------+--------------+  CFV       Full            Yes       Yes                                    +---------+---------------+---------+-----------+----------+--------------+  SFJ       Full                                                             +---------+---------------+---------+-----------+----------+--------------+  FV Prox   Full                                                             +---------+---------------+---------+-----------+----------+--------------+  FV Mid    Full                                                             +---------+---------------+---------+-----------+----------+--------------+  FV Distal Full                                                             +---------+---------------+---------+-----------+----------+--------------+  PFV       Full                                                             +---------+---------------+---------+-----------+----------+--------------+  POP       Full            Yes       Yes                                    +---------+---------------+---------+-----------+----------+--------------+  PTV       Full                                                             +---------+---------------+---------+-----------+----------+--------------+  PERO      Full                                                             +---------+---------------+---------+-----------+----------+--------------+     Summary: Right: There is no evidence of deep vein thrombosis in the  lower extremity. No cystic structure found in the popliteal fossa. Left: There is no evidence of deep vein thrombosis in the lower extremity. No cystic structure found in the popliteal fossa.  *See table(s) above for measurements and observations. Electronically signed by Ruta Hinds MD on 10/04/2019 at 3:31:58 PM.    Final

## 2019-10-09 ENCOUNTER — Inpatient Hospital Stay (HOSPITAL_COMMUNITY): Payer: Commercial Managed Care - PPO

## 2019-10-09 DIAGNOSIS — R7989 Other specified abnormal findings of blood chemistry: Secondary | ICD-10-CM | POA: Diagnosis not present

## 2019-10-09 LAB — COMPREHENSIVE METABOLIC PANEL
ALT: 38 U/L (ref 0–44)
AST: 46 U/L — ABNORMAL HIGH (ref 15–41)
Albumin: 3 g/dL — ABNORMAL LOW (ref 3.5–5.0)
Alkaline Phosphatase: 114 U/L (ref 38–126)
Anion gap: 11 (ref 5–15)
BUN: 42 mg/dL — ABNORMAL HIGH (ref 8–23)
CO2: 24 mmol/L (ref 22–32)
Calcium: 9.1 mg/dL (ref 8.9–10.3)
Chloride: 110 mmol/L (ref 98–111)
Creatinine, Ser: 1.32 mg/dL — ABNORMAL HIGH (ref 0.61–1.24)
GFR calc Af Amer: >60 mL/min (ref >60)
GFR calc non Af Amer: 57 mL/min — ABNORMAL LOW (ref >60)
Glucose, Bld: 102 mg/dL — ABNORMAL HIGH (ref 70–99)
Potassium: 4.2 mmol/L (ref 3.5–5.1)
Sodium: 145 mmol/L (ref 135–145)
Total Bilirubin: 0.4 mg/dL (ref 0.3–1.2)
Total Protein: 7.1 g/dL (ref 6.5–8.1)

## 2019-10-09 LAB — CBC
HCT: 39.6 % (ref 39.0–52.0)
Hemoglobin: 12.7 g/dL — ABNORMAL LOW (ref 13.0–17.0)
MCH: 25 pg — ABNORMAL LOW (ref 26.0–34.0)
MCHC: 32.1 g/dL (ref 30.0–36.0)
MCV: 78.1 fL — ABNORMAL LOW (ref 80.0–100.0)
Platelets: 477 10*3/uL — ABNORMAL HIGH (ref 150–400)
RBC: 5.07 MIL/uL (ref 4.22–5.81)
RDW: 14.2 % (ref 11.5–15.5)
WBC: 9.3 10*3/uL (ref 4.0–10.5)
nRBC: 0.8 % — ABNORMAL HIGH (ref 0.0–0.2)

## 2019-10-09 LAB — GLUCOSE, CAPILLARY
Glucose-Capillary: 120 mg/dL — ABNORMAL HIGH (ref 70–99)
Glucose-Capillary: 379 mg/dL — ABNORMAL HIGH (ref 70–99)
Glucose-Capillary: 354 mg/dL — ABNORMAL HIGH (ref 70–99)
Glucose-Capillary: 222 mg/dL — ABNORMAL HIGH (ref 70–99)

## 2019-10-09 LAB — FERRITIN: Ferritin: 325 ng/mL (ref 24–336)

## 2019-10-09 LAB — C-REACTIVE PROTEIN: CRP: 1.4 mg/dL — ABNORMAL HIGH (ref <1.0)

## 2019-10-09 LAB — D-DIMER, QUANTITATIVE: D-Dimer, Quant: >20 ug/mL-FEU — ABNORMAL HIGH (ref 0.00–0.50)

## 2019-10-09 MED ORDER — ENOXAPARIN SODIUM 100 MG/ML ~~LOC~~ SOLN
100.0000 mg | Freq: Two times a day (BID) | SUBCUTANEOUS | Status: DC
  Administered 2019-10-09 – 2019-10-16 (×15): 100 mg via SUBCUTANEOUS
  Filled 2019-10-09 (×18): qty 1

## 2019-10-09 MED ORDER — IOHEXOL 350 MG/ML SOLN
75.0000 mL | Freq: Once | INTRAVENOUS | Status: AC | PRN
  Administered 2019-10-09: 75 mL via INTRAVENOUS

## 2019-10-09 MED ORDER — ALPRAZOLAM 0.5 MG PO TABS
0.5000 mg | ORAL_TABLET | Freq: Three times a day (TID) | ORAL | Status: DC | PRN
  Administered 2019-10-09 – 2019-10-12 (×5): 0.5 mg via ORAL
  Filled 2019-10-09 (×5): qty 1

## 2019-10-09 NOTE — Plan of Care (Signed)

## 2019-10-09 NOTE — Progress Notes (Signed)
[place] for Lovenox Indication: VTE treatment  Allergies  Allergen Reactions  . Shellfish Allergy Hives    Patient Measurements: Height: 5\' 7"  (170.2 cm) Weight: 225 lb (102.1 kg) IBW/kg (Calculated) : 66.1 Heparin Dosing Weight:   Vital Signs: Temp: 98.8 F (37.1 C) (11/22 0300) Temp Source: Oral (11/22 0300) BP: 117/67 (11/22 0744) Pulse Rate: 80 (11/22 0744)  Labs: Recent Labs    10/07/19 0327 10/08/19 0140 10/09/19 0054  HGB 12.8* 12.6* 12.7*  HCT 39.8 39.2 39.6  PLT 463* 456* 477*  CREATININE 1.42* 1.52* 1.32*    Estimated Creatinine Clearance: 66.1 mL/min (A) (by C-G formula based on SCr of 1.32 mg/dL (H)).  Medications:  Scheduled:  . aspirin EC  81 mg Oral Daily  . atorvastatin  40 mg Oral q1800  . Chlorhexidine Gluconate Cloth  6 each Topical Daily  . dexamethasone (DECADRON) injection  6 mg Intravenous Q24H  . enoxaparin (LOVENOX) injection  100 mg Subcutaneous Q12H  . famotidine  20 mg Oral Daily  . insulin aspart  0-20 Units Subcutaneous TID WC  . insulin aspart  4 Units Subcutaneous TID WC  . insulin detemir  15 Units Subcutaneous BID  . Ipratropium-Albuterol  2 puff Inhalation Q6H  . metoprolol tartrate  12.5 mg Oral BID  . multivitamin with minerals  1 tablet Oral Daily  . ticagrelor  90 mg Oral BID  . vitamin B-12  100 mcg Oral Daily  . vitamin C  500 mg Oral Daily  . zinc sulfate  220 mg Oral Daily   Infusions:    Assessment: 58 yoM admitted on 11/16 with COVID-19 pneumonia.  He has been on Lovenox for VTE prophylaxis (BID dosing while in ICU from 11/17-11/21). 11/17 Dopplers negative for DVT Pharmacy is now consulted to dose Lovenox treatment dose for VTE.  SCr 1.32 with CrCl ~ 66 ml/min CBC stable: Hgb 12.7, Plt 477 No bleeding or complications reported D-dimer increasing:  0.87 > 1.15 > 1.82 > 2.49 > 20   Goal of Therapy:  Anti-Xa level 0.6-1 units/ml 4hrs after LMWH dose given Monitor  platelets by anticoagulation protocol: Yes   Plan:  Lovenox 1 mg/kg (100mg ) Ava q12h Follow up renal function, CBC, diagnostics.  Gretta Arab PharmD, BCPS Clinical pharmacist phone 7am- 5pm: [phone] 10/09/2019 8:03 AM

## 2019-10-09 NOTE — Progress Notes (Signed)
PROGRESS NOTE                                                                                                                                                                                                             Patient Demographics:    William [name], is a 63 y.o. male, DOB - Sep 14, 1956, [id number]  Outpatient Primary MD for the patient is [name], MD   Admit date - 09/21/2019   LOS - 6  Chief Complaint  Patient presents with   COVID positive   Shortness of Breath       Brief Narrative: Patient is a 63 y.o. male with PMHx of HTN, dyslipidemia, DM-2, CAD-who presented with shortness of breath and cough-he was found to have acute hypoxic respiratory failure secondary to COVID-19 pneumonia.  Significant events: 11/16>> admit to [facility] 11/17>> transferred to ICU for worsening hypoxemia requiring heated high flow 11/20>> transferred back to PCU  COVID-19 Medications: Steroids:11/16>> Remdesivir:11/15>>11/19 Actemra:11/17>>x 1 Convalescent Plasma:11/17>>x1   Subjective:    William [name] today sitting up in chair shortness of breath is improved, denies any headache chest or abdominal pain.  No swelling in his legs.   Assessment  & Plan :   Acute Hypoxic Resp Failure due to Covid 19 Viral pneumonia: has severe pulmonary disease, was treated with steroids and remdesivir, he was treated in ICU and stabilized and transferred to my care on 10/09/2019.  He is still extremely hypoxic requiring high flow nasal cannula oxygen between 11 to 15 L.  CTA rules out PE on 10/09/2019.  Continue supportive care.  Was encouraged to sit up in chair in the daytime use I-S and flutter valve for pulmonary toiletry and then prone at bed at night.   O2 requirements:  SpO2: 90 % O2 Flow Rate (L/min): (S) 11 L/min FiO2 (%): 60 %   COVID-19 Labs: Recent Labs    10/07/19 0327 10/08/19 0140 10/09/19 0054  DDIMER  1.82* 2.49* >20.00*  FERRITIN 336 314 325  CRP 4.7* 2.7* 1.4*    Lab Results  Component Value Date   SARSCOV2NAA POSITIVE (A) 09/19/2019     AKI: Improved-suspect may have developed some mild CKD stage III , renal function seems to have stabilized with a creatinine of around 1.4.  Chronic diastolic heart failure: Compensated-holding Lasix today-overall net negative about 12 L.  HTN: Controlled-continue metoprolol  Dyslipidemia: Continue statin  History of CAD: No anginal symptoms-continue dual antiplatelet agents, statin and metoprolol  Remote history of DVT: D-dimer jumped to above 20 on 10/09/2019, CTA negative, repeat lower extremity duplex, full dose Lovenox till we rule out clot and D-dimer starts to trend down.  Repeat D-dimer in the morning.  Obesity: BMI 35.  Follow with PCP for weight loss.   DM-2 (A1c 8.1 on 10/10/2019): Poor outpatient control due to hyperglycemia.  On Lantus sliding scale and premeal NovoLog.  Monitor and adjust.  CBG (last 3)  Recent Labs    10/08/19 1617 10/08/19 2113 10/09/19 0816  GLUCAP 391* 282* 120*     GI prophylaxis:H2 Blocker  Consults  :  PCCM  Procedures  :  None    Condition - Guarded  Family Communication  : William [name] on 11/21  Code Status :  Full Code  Diet :  Diet Order            Diet Carb Modified Fluid consistency: Thin; Room service appropriate? Yes  Diet effective now               Disposition Plan  :  Remain hospitalized-suspect can be transferred to PCU later today if he remains stable on regular high flow.  Barriers to discharge: Hypoxia requiring O2 supplementation/complete 5 days of IV Remdesivir  Antimicorbials  :    Anti-infectives (From admission, onward)   Start     Dose/Rate Route Frequency Ordered Stop   10/04/19 1600  remdesivir 100 mg in sodium chloride 0.9 % 250 mL IVPB     100 mg 500 mL/hr over 30 Minutes Intravenous Daily 10/16/2019 2358 10/06/19 1140   10/17/2019 2200  remdesivir 100 mg  in sodium chloride 0.9 % 250 mL IVPB  Status:  Discontinued     100 mg 500 mL/hr over 30 Minutes Intravenous Every 24 hours 09/22/2019 1537 10/02/2019 2358     DVT Prophylaxis  :  Lovenox    Inpatient Medications  Scheduled Meds:  aspirin EC  81 mg Oral Daily   atorvastatin  40 mg Oral q1800   Chlorhexidine Gluconate Cloth  6 each Topical Daily   dexamethasone (DECADRON) injection  6 mg Intravenous Q24H   enoxaparin (LOVENOX) injection  100 mg Subcutaneous Q12H   famotidine  20 mg Oral Daily   insulin aspart  0-20 Units Subcutaneous TID WC   insulin aspart  4 Units Subcutaneous TID WC   insulin detemir  15 Units Subcutaneous BID   Ipratropium-Albuterol  2 puff Inhalation Q6H   metoprolol tartrate  12.5 mg Oral BID   multivitamin with minerals  1 tablet Oral Daily   ticagrelor  90 mg Oral BID   vitamin B-12  100 mcg Oral Daily   vitamin C  500 mg Oral Daily   zinc sulfate  220 mg Oral Daily   Continuous Infusions:  PRN Meds:.acetaminophen, clonazePAM, lip balm, nitroGLYCERIN, ondansetron **OR** ondansetron (ZOFRAN) IV, sodium chloride, tetrahydrozoline  Time Spent in minutes  25   See all Orders from today for further details  Lala Lund M.D on 10/09/2019 at 11:10 AM  To page go to [email or web address] - use universal password  [facility] -  Office  [phone]    Objective:   Vitals:   10/09/19 0732 10/09/19 0744 10/09/19 0800 10/09/19 1048  BP:  117/67    Pulse: 73 80 72 87  Resp: 19 18 (!) 23 (!) 22  Temp:  TempSrc:      SpO2: (!) 87% (!) 88% 90% 90%  Weight:      Height:        Wt Readings from Last 3 Encounters:  10/17/2019 102.1 kg  09/24/2019 102.1 kg  03/30/14 97.5 kg     Intake/Output Summary (Last 24 hours) at 10/09/2019 1110 Last data filed at 10/09/2019 0600 Gross per 24 hour  Intake 1720 ml  Output 2200 ml  Net -480 ml     Physical Exam  Awake Alert, Oriented X 3, No new F.N deficits, Normal  affect .AT,PERRAL Supple Neck,No JVD, No cervical lymphadenopathy appriciated.  Symmetrical Chest wall movement, Good air movement bilaterally, CTAB RRR,No Gallops, Rubs or new Murmurs, No Parasternal Heave +ve B.Sounds, Abd Soft, No tenderness, No organomegaly appriciated, No rebound - guarding or rigidity. No Cyanosis, Clubbing or edema, No new Rash or bruise    Data Review:    CBC Recent Labs  Lab 10/04/19 0600 10/05/19 0537 10/06/19 0442 10/07/19 0327 10/08/19 0140 10/09/19 0054  WBC 9.8 10.6* 11.6* 10.4 11.0* 9.3  HGB 12.6* 12.7* 12.5* 12.8* 12.6* 12.7*  HCT 39.9 39.4 38.4* 39.8 39.2 39.6  PLT 370 408* 415* 463* 456* 477*  MCV 79.0* 77.7* 78.4* 78.0* 77.8* 78.1*  MCH 25.0* 25.0* 25.5* 25.1* 25.0* 25.0*  MCHC 31.6 32.2 32.6 32.2 32.1 32.1  RDW 14.8 14.6 14.6 14.2 14.2 14.2  LYMPHSABS 0.4* 0.8 0.6* 0.6* 0.5*  --   MONOABS 0.3 0.7 0.6 0.5 0.3  --   EOSABS 0.0 0.0 0.0 0.0 0.0  --   BASOSABS 0.0 0.0 0.0 0.0 0.0  --     Chemistries  Recent Labs  Lab 10/04/19 0600 10/05/19 0537 10/06/19 0442 10/07/19 0327 10/08/19 0140 10/09/19 0054  NA 147* 145 141 142 137 145  K 5.0 4.5 4.5 4.2 4.6 4.2  CL 111 107 107 106 102 110  CO2 23 25 22 23 23 24   GLUCOSE 229* 72 109* 86 312* 102*  BUN 44* 53* 50* 51* 53* 42*  CREATININE 1.62* 1.53* 1.33* 1.42* 1.52* 1.32*  CALCIUM 8.1* 8.7* 8.4* 8.6* 8.5* 9.1  MG 3.7* 3.7* 3.2* 2.9* 2.8*  --   AST 26 32 53* 40 29 46*  ALT 17 18 30  35 30 38  ALKPHOS 44 51 63 81 99 114  BILITOT 0.4 0.4 0.4 0.6 0.9 0.4   ------------------------------------------------------------------------------------------------------------------ No results for input(s): CHOL, HDL, LDLCALC, TRIG, CHOLHDL, LDLDIRECT in the last 72 hours.  Lab Results  Component Value Date   HGBA1C 8.1 (H) 09/19/2019   ------------------------------------------------------------------------------------------------------------------ No results for input(s): TSH, T4TOTAL,  T3FREE, THYROIDAB in the last 72 hours.  Invalid input(s): FREET3 ------------------------------------------------------------------------------------------------------------------ Recent Labs    10/08/19 0140 10/09/19 0054  FERRITIN 314 325    Coagulation profile No results for input(s): INR, PROTIME in the last 168 hours.  Recent Labs    10/08/19 0140 10/09/19 0054  DDIMER 2.49* >20.00*    Cardiac Enzymes No results for input(s): CKMB, TROPONINI, MYOGLOBIN in the last 168 hours.  Invalid input(s): CK ------------------------------------------------------------------------------------------------------------------    Component Value Date/Time   BNP 26.7 10/04/2019 1809    Micro Results Recent Results (from the past 240 hour(s))  SARS CORONAVIRUS 2 (TAT 6-24 HRS) Nasopharyngeal Nasopharyngeal Swab     Status: Abnormal   Collection Time: 09/26/2019  6:09 PM   Specimen: Nasopharyngeal Swab  Result Value Ref Range Status   SARS Coronavirus 2 POSITIVE (A) NEGATIVE Final    Comment: RESULT CALLED TO,  READ BACK BY AND VERIFIED WITH: Herby Abraham, RN AT 2324 ON 10/01/2019 BY SAINVILUS S (NOTE) SARS-CoV-2 target nucleic acids are DETECTED. The SARS-CoV-2 RNA is generally detectable in upper and lower respiratory specimens during the acute phase of infection. Positive results are indicative of active infection with SARS-CoV-2. Clinical  correlation with patient history and other diagnostic information is necessary to determine patient infection status. Positive results do  not rule out bacterial infection or co-infection with other viruses. The expected result is Negative. Fact Sheet for Patients: SugarRoll.be Fact Sheet for Healthcare Providers: https://www.woods-mathews.com/ This test is not yet approved or cleared by the [place] FDA and  has been authorized for detection and/or diagnosis of SARS-CoV-2 by FDA under an Emergency  Use Authorization (EUA). This EUA will remain  in effect (meaning this test can  be used) for the duration of the COVID-19 declaration under Section 564(b)(1) of the Act, 21 U.S.C. section 360bbb-3(b)(1), unless the authorization is terminated or revoked sooner. Performed at [facility], [place] [address] [place], [place] [address]   Blood culture (routine x 2)     Status: None   Collection Time: 09/23/2019  1:40 PM   Specimen: BLOOD  Result Value Ref Range Status   Specimen Description   Final    BLOOD RIGHT ANTECUBITAL Performed at [facility], [place] [address] [place], [place] [address]    Special Requests   Final    BOTTLES DRAWN AEROBIC AND ANAEROBIC Blood Culture adequate volume Performed at [place] [address], [place], [place] [address]    Culture   Final    NO GROWTH 5 DAYS Performed at [facility], [place] [address] [place], [place] [address]    Report Status 10/08/2019 FINAL  Final  Blood culture (routine x 2)     Status: None   Collection Time: 10/13/2019  1:45 PM   Specimen: BLOOD RIGHT WRIST  Result Value Ref Range Status   Specimen Description   Final    BLOOD RIGHT WRIST Performed at [place] [address] [place], [place] [address]    Special Requests   Final    BOTTLES DRAWN AEROBIC AND ANAEROBIC Blood Culture adequate volume Performed at [place] [address] [place], [place] [address]    Culture   Final    NO GROWTH 5 DAYS Performed at [facility], [place] [address], [place],  [address]    Report Status 10/08/2019 FINAL  Final  MRSA PCR Screening     Status: None   Collection Time: 10/05/19  6:39 PM   Specimen: Nasopharyngeal  Result Value Ref Range Status   MRSA by PCR NEGATIVE NEGATIVE Final    Comment:        The GeneXpert MRSA Assay (FDA approved for NASAL specimens only), is one component of a comprehensive MRSA colonization surveillance  program. It is not intended to diagnose MRSA infection nor to guide or monitor treatment for MRSA infections. Performed at [facility], [place] [address], [place],  [address]     Radiology Reports [place]  Result Date: 10/09/2019 CLINICAL DATA:  COVID pneumonia, shortness of breath, concern for PE EXAM: CT ANGIOGRAPHY CHEST WITH CONTRAST TECHNIQUE: Multidetector CT imaging of the chest was performed using the standard protocol during bolus administration of intravenous contrast. Multiplanar CT image reconstructions and MIPs were obtained to evaluate the vascular anatomy. CONTRAST:  19mL OMNIPAQUE IOHEXOL 350  MG/ML SOLN COMPARISON:  None. FINDINGS: Cardiovascular: No significant filling defect or pulmonary embolus demonstrated by CTA. Pulmonary arteries appear patent. Limited assessment of the lower lobes with motion artifact from breathing. Minor aortic atherosclerosis. Negative for aneurysm or dissection. Patent two-vessel arch anatomy. No mediastinal hemorrhage or hematoma. Native coronary atherosclerosis noted. Heart size without pericardial effusion Mediastinum/Nodes: Prominent thyroid noted extending substernal. Trachea and esophagus unremarkable. Esophagus is nondilated. Mildly prominent prevascular, hilar and subcarinal lymph nodes, suspect reactive/inflammatory. No bulky adenopathy. Lungs/Pleura: Extensive diffuse and patchy ground-glass opacities throughout all lobes of both lungs with some sparing of the lung apices. There is associated bibasilar consolidation/atelectasis with central air bronchograms. Appearance compatible with atypical pneumonia/viral pneumonia as seen with COVID-19. No significant effusion. No other pleural abnormality or pneumothorax. Trachea and central airways remain patent. No mucous plugging or obstruction. Upper Abdomen: Fatty infiltration of the liver suspected. No acute finding in the upper abdomen. Musculoskeletal: No chest wall abnormality.  No acute or significant osseous findings. Review of the MIP images confirms the above findings. IMPRESSION: 1. Negative for significant acute pulmonary embolus by CTA. 2. Extensive diffuse and patchy ground-glass opacities throughout all lobes of both lungs with some sparing of the lung apices. Findings compatible with atypical pneumonia/viral pneumonia as seen with COVID-19. Aortic Atherosclerosis (ICD10-I70.0). Electronically Signed   By: Jerilynn Mages.  Shick M.D.   On: 10/09/2019 09:23   Dg Chest Port 1 View  Result Date: 10/04/2019 CLINICAL DATA:  Hypoxia EXAM: PORTABLE CHEST 1 VIEW COMPARISON:  09/22/2019 FINDINGS: Interstitial/patchy opacities in the bilateral upper and lower lobes, unchanged. No definite pleural effusions. No pneumothorax. The heart is top-normal in size. IMPRESSION: Multifocal pneumonia in this patient with known COVID, unchanged. Electronically Signed   By: Julian Hy M.D.   On: 10/04/2019 10:07   Dg Chest Portable 1 View  Result Date: 10/15/2019 CLINICAL DATA:  Hypoxia, shortness of breath. EXAM: PORTABLE CHEST 1 VIEW COMPARISON:  October 02, 2019. FINDINGS: The heart size and mediastinal contours are within normal limits. No pneumothorax or pleural effusion is noted. Stable left perihilar and lingular opacity is noted as well as mild right upper lobe opacity consistent with multifocal pneumonia. The visualized skeletal structures are unremarkable. IMPRESSION: Stable bilateral lung opacities are noted, left greater than right, consistent with multifocal pneumonia. Electronically Signed   By: Marijo Conception M.D.   On: 10/02/2019 14:14   Dg Chest Port 1 View  Result Date: 10/14/2019 CLINICAL DATA:  Pt complains of chest pain x 3 days, non-radiating, worse with inspiration, cough, fever, and generally feeling unwell. Pt was unable to hold deep inspiration for x-ray due to coughing. EXAM: PORTABLE CHEST 1 VIEW COMPARISON:  01/07/2014 FINDINGS: Hazy interstitial and airspace  opacities present in the left perihilar region, right upper lobe, and left lower lobe. Low lung volumes are present, causing crowding of the pulmonary vasculature. No blunting of the costophrenic angles. Cardiac and mediastinal margins appear normal. IMPRESSION: 1. Hazy interstitial and airspace opacities in the left perihilar region, right upper lobe, and left lower lobe, suspicious for multilobar pneumonia or atypical pneumonia. 2. Low lung volumes. Electronically Signed   By: Van Clines M.D.   On: 10/16/2019 18:57   Le Venous  Result Date: 10/04/2019  Lower Venous Study Indications: Covid-19 positive, and Swelling.  Anticoagulation: Lovenox. Comparison Study: No priors Performing Technologist: Velva Harman Sturdivant RDMS, RVT  Examination Guidelines: A complete evaluation includes B-mode imaging, spectral Doppler, color Doppler, and power Doppler as needed of all accessible portions of  each vessel. Bilateral testing is considered an integral part of a complete examination. Limited examinations for reoccurring indications may be performed as noted.  +---------+---------------+---------+-----------+----------+--------------+  RIGHT     Compressibility Phasicity Spontaneity Properties Thrombus Aging  +---------+---------------+---------+-----------+----------+--------------+  CFV       Full            Yes       Yes                                    +---------+---------------+---------+-----------+----------+--------------+  SFJ       Full                                                             +---------+---------------+---------+-----------+----------+--------------+  FV Prox   Full                                                             +---------+---------------+---------+-----------+----------+--------------+  FV Mid    Full                                                             +---------+---------------+---------+-----------+----------+--------------+  FV Distal Full                                                              +---------+---------------+---------+-----------+----------+--------------+  PFV       Full                                                             +---------+---------------+---------+-----------+----------+--------------+  POP       Full            No        Yes                                    +---------+---------------+---------+-----------+----------+--------------+  PTV       Full                                                             +---------+---------------+---------+-----------+----------+--------------+  PERO      Full                                                             +---------+---------------+---------+-----------+----------+--------------+   +---------+---------------+---------+-----------+----------+--------------+  LEFT      Compressibility Phasicity Spontaneity Properties Thrombus Aging  +---------+---------------+---------+-----------+----------+--------------+  CFV       Full            Yes       Yes                                    +---------+---------------+---------+-----------+----------+--------------+  SFJ       Full                                                             +---------+---------------+---------+-----------+----------+--------------+  FV Prox   Full                                                             +---------+---------------+---------+-----------+----------+--------------+  FV Mid    Full                                                             +---------+---------------+---------+-----------+----------+--------------+  FV Distal Full                                                             +---------+---------------+---------+-----------+----------+--------------+  PFV       Full                                                             +---------+---------------+---------+-----------+----------+--------------+  POP       Full            Yes       Yes                                     +---------+---------------+---------+-----------+----------+--------------+  PTV       Full                                                             +---------+---------------+---------+-----------+----------+--------------+  PERO      Full                                                             +---------+---------------+---------+-----------+----------+--------------+  Summary: Right: There is no evidence of deep vein thrombosis in the lower extremity. No cystic structure found in the popliteal fossa. Left: There is no evidence of deep vein thrombosis in the lower extremity. No cystic structure found in the popliteal fossa.  *See table(s) above for measurements and observations. Electronically signed by Ruta Hinds MD on 10/04/2019 at 3:31:58 PM.    Final

## 2019-10-09 NOTE — Progress Notes (Signed)
Upon initial  patient assessment, pt found in bed on HFNC@15  and non-rebreather mask with labored breathing. Pt repositioned and non-rebreather removed by RN. Pt placed in seated position on the side of the bed an breathing exercises with flutter valve and IS preformed. Scheduled Combivent  Also given. Pt then transferred to chair with RN assistance. O2 probe changed to ear. Pt then instructed to preform purse lipped breathing exercise with the use of pillow. O2 sat increased to 87-89%. Pt monitored and O2 decreased to 13L/HFNC by RN. Pt currently resting in chair with no s/s of distress noted. O2 89-90%. Will continue to monitor.

## 2019-10-10 LAB — CBC WITH DIFFERENTIAL/PLATELET
Abs Immature Granulocytes: 0.08 10*3/uL — ABNORMAL HIGH (ref 0.00–0.07)
Basophils Absolute: 0 10*3/uL (ref 0.0–0.1)
Basophils Relative: 0 %
Eosinophils Absolute: 0.2 10*3/uL (ref 0.0–0.5)
Eosinophils Relative: 2 %
HCT: 40 % (ref 39.0–52.0)
Hemoglobin: 12.6 g/dL — ABNORMAL LOW (ref 13.0–17.0)
Immature Granulocytes: 1 %
Lymphocytes Relative: 6 %
Lymphs Abs: 0.5 10*3/uL — ABNORMAL LOW (ref 0.7–4.0)
MCH: 24.8 pg — ABNORMAL LOW (ref 26.0–34.0)
MCHC: 31.5 g/dL (ref 30.0–36.0)
MCV: 78.7 fL — ABNORMAL LOW (ref 80.0–100.0)
Monocytes Absolute: 0.1 10*3/uL (ref 0.1–1.0)
Monocytes Relative: 2 %
Neutro Abs: 8 10*3/uL — ABNORMAL HIGH (ref 1.7–7.7)
Neutrophils Relative %: 89 %
Platelets: 431 10*3/uL — ABNORMAL HIGH (ref 150–400)
RBC: 5.08 MIL/uL (ref 4.22–5.81)
RDW: 14.5 % (ref 11.5–15.5)
WBC: 8.9 10*3/uL (ref 4.0–10.5)
nRBC: 0.6 % — ABNORMAL HIGH (ref 0.0–0.2)

## 2019-10-10 LAB — GLUCOSE, CAPILLARY
Glucose-Capillary: 346 mg/dL — ABNORMAL HIGH (ref 70–99)
Glucose-Capillary: 76 mg/dL (ref 70–99)
Glucose-Capillary: 414 mg/dL — ABNORMAL HIGH (ref 70–99)
Glucose-Capillary: 417 mg/dL — ABNORMAL HIGH (ref 70–99)
Glucose-Capillary: 295 mg/dL — ABNORMAL HIGH (ref 70–99)

## 2019-10-10 LAB — COMPREHENSIVE METABOLIC PANEL
ALT: 48 U/L — ABNORMAL HIGH (ref 0–44)
AST: 64 U/L — ABNORMAL HIGH (ref 15–41)
Albumin: 3 g/dL — ABNORMAL LOW (ref 3.5–5.0)
Alkaline Phosphatase: 136 U/L — ABNORMAL HIGH (ref 38–126)
Anion gap: 9 (ref 5–15)
BUN: 43 mg/dL — ABNORMAL HIGH (ref 8–23)
CO2: 25 mmol/L (ref 22–32)
Calcium: 8.7 mg/dL — ABNORMAL LOW (ref 8.9–10.3)
Chloride: 108 mmol/L (ref 98–111)
Creatinine, Ser: 1.56 mg/dL — ABNORMAL HIGH (ref 0.61–1.24)
GFR calc Af Amer: 54 mL/min — ABNORMAL LOW (ref >60)
GFR calc non Af Amer: 47 mL/min — ABNORMAL LOW (ref >60)
Glucose, Bld: 40 mg/dL — CL (ref 70–99)
Potassium: 4.4 mmol/L (ref 3.5–5.1)
Sodium: 142 mmol/L (ref 135–145)
Total Bilirubin: 0.6 mg/dL (ref 0.3–1.2)
Total Protein: 6.9 g/dL (ref 6.5–8.1)

## 2019-10-10 LAB — C-REACTIVE PROTEIN: CRP: 3.2 mg/dL — ABNORMAL HIGH (ref <1.0)

## 2019-10-10 LAB — BRAIN NATRIURETIC PEPTIDE: B Natriuretic Peptide: 63.4 pg/mL (ref 0.0–100.0)

## 2019-10-10 LAB — MAGNESIUM: Magnesium: 2.6 mg/dL — ABNORMAL HIGH (ref 1.7–2.4)

## 2019-10-10 LAB — D-DIMER, QUANTITATIVE: D-Dimer, Quant: >20 ug/mL-FEU — ABNORMAL HIGH (ref 0.00–0.50)

## 2019-10-10 MED ORDER — FUROSEMIDE 10 MG/ML IJ SOLN
60.0000 mg | Freq: Once | INTRAMUSCULAR | Status: AC
  Administered 2019-10-10: 09:00:00 60 mg via INTRAVENOUS
  Filled 2019-10-10: qty 6

## 2019-10-10 MED ORDER — INSULIN DETEMIR 100 UNIT/ML ~~LOC~~ SOLN
10.0000 [IU] | Freq: Two times a day (BID) | SUBCUTANEOUS | Status: DC
  Administered 2019-10-10 – 2019-10-11 (×3): 10 [IU] via SUBCUTANEOUS
  Filled 2019-10-10 (×3): qty 0.1

## 2019-10-10 MED ORDER — INSULIN ASPART 100 UNIT/ML ~~LOC~~ SOLN
30.0000 [IU] | Freq: Once | SUBCUTANEOUS | Status: AC
  Administered 2019-10-10: 30 [IU] via SUBCUTANEOUS

## 2019-10-10 MED ORDER — DEXTROSE 50 % IV SOLN
25.0000 mL | Freq: Once | INTRAVENOUS | Status: DC
  Filled 2019-10-10: qty 50

## 2019-10-10 MED ORDER — METHYLPREDNISOLONE SODIUM SUCC 125 MG IJ SOLR
60.0000 mg | Freq: Two times a day (BID) | INTRAMUSCULAR | Status: DC
  Administered 2019-10-10 – 2019-10-14 (×10): 60 mg via INTRAVENOUS
  Filled 2019-10-10 (×10): qty 2

## 2019-10-10 MED ORDER — INSULIN ASPART 100 UNIT/ML ~~LOC~~ SOLN
0.0000 [IU] | Freq: Every day | SUBCUTANEOUS | Status: DC
  Administered 2019-10-10: 3 [IU] via SUBCUTANEOUS
  Administered 2019-10-11: 2 [IU] via SUBCUTANEOUS

## 2019-10-10 MED ORDER — INSULIN ASPART 100 UNIT/ML ~~LOC~~ SOLN
0.0000 [IU] | Freq: Three times a day (TID) | SUBCUTANEOUS | Status: DC
  Administered 2019-10-10: 15 [IU] via SUBCUTANEOUS
  Administered 2019-10-11: 5 [IU] via SUBCUTANEOUS
  Administered 2019-10-11: 15 [IU] via SUBCUTANEOUS
  Administered 2019-10-11: 11 [IU] via SUBCUTANEOUS
  Administered 2019-10-12: 5 [IU] via SUBCUTANEOUS
  Administered 2019-10-12: 8 [IU] via SUBCUTANEOUS
  Administered 2019-10-12: 13:00:00 5 [IU] via SUBCUTANEOUS

## 2019-10-10 NOTE — Progress Notes (Addendum)
PROGRESS NOTE                                                                                                                                                                                                             Patient Demographics:    William [name], is a 63 y.o. male, DOB - 1956-11-01, [id number]  Outpatient Primary MD for the patient is [name], MD   Admit date - 10/14/2019   LOS - 7  Chief Complaint  Patient presents with   COVID positive   Shortness of Breath       Brief Narrative: Patient is a 63 y.o. male with PMHx of HTN, dyslipidemia, DM-2, CAD-who presented with shortness of breath and cough-he was found to have acute hypoxic respiratory failure secondary to COVID-19 pneumonia.  Significant events: 11/16>> admit to [facility] 11/17>> transferred to ICU for worsening hypoxemia requiring heated high flow 11/20>> transferred back to PCU  COVID-19 Medications: Steroids:11/16>> Remdesivir:11/15>>11/19 Actemra:11/17>>x 1 Convalescent Plasma:11/17>>x1   Subjective:   Patient in bed, appears comfortable, denies any headache, no fever, no chest pain or pressure, +++ shortness of breath , no abdominal pain. No focal weakness.   Assessment  & Plan :   Acute Hypoxic Resp Failure due to Covid 19 Viral pneumonia: has severe pulmonary disease, he has been treated so far with steroids, remdesivir, convalescent plasma along with Actemra, he was treated in ICU and stabilized and transferred to my care on 10/09/2019.  He is still extremely hypoxic requiring both high flow oxygen and nonrebreather mask.  His inflammatory markers are rising again hence I will switch him from Decadron to high-dose Solu-Medrol, he continues to remain very tenuous he might require getting transferred back to ICU if he does not turn around.  We will also challenge him with Lasix.  Updated wife about his poor prognosis.  Was  encouraged to sit up in chair in the daytime use I-S and flutter valve for pulmonary toiletry and then prone at bed at night.  Talk to the patient again at 5:35 PM over the phone, he was alert awake, answered all my questions appropriately and said he was feeling comfortable.  He was wearing a nonrebreather mask and on the monitor his pulse ox was around 91%.  He said he is hungry and would like to eat.  Discussed the  plan with the nurse.  Will try to keep him on high flow nasal cannula oxygen with or without nonrebreather mask as needed.  He appears to be in no distress and mentating well.  Also discussed with charge nurse Estill Bamberg.,  He will be seen by the charge nurse for the hospital during the night as well.  If in any distress he will be moved to ICU.   COVID-19 Labs: Recent Labs    10/08/19 0140 10/09/19 0054 10/10/19 0255  DDIMER 2.49* >20.00* >20.00*  FERRITIN 314 325  --   CRP 2.7* 1.4* 3.2*    Lab Results  Component Value Date   SARSCOV2NAA POSITIVE (A) 09/23/2019     AKI: Improved-suspect may have developed some mild CKD stage III , renal function seems to have stabilized with a creatinine of around 1.4.  Chronic diastolic heart failure: Compensated- repeat IV Lasix - 10/10/19.  HTN: Controlled-continue metoprolol  Dyslipidemia: Continue statin  History of CAD: No anginal symptoms-continue dual antiplatelet agents, statin and metoprolol.  Remote history of DVT: ++ D dimer , negative CT and lower extremity venous duplex.  Will continue full dose Lovenox for now.  Obesity: BMI 35.  Follow with PCP for weight loss.   DM-2 (A1c 8.1 on 10/14/2019): Poor outpatient control due to hyperglycemia.  On Lantus sliding scale and premeal NovoLog.  Monitor and adjust.  CBG (last 3)  Recent Labs    10/09/19 2128 10/10/19 0719 10/10/19 1107  GLUCAP 222* 76 414*       GI prophylaxis: H2 Blocker  Consults  :  PCCM  Procedures  :    CTA and Leg [place] - No Clots  TTE  -    1. Left ventricular ejection fraction, by visual estimation, is 60 to 65%. The left ventricle has normal function. There is moderately increased left ventricular hypertrophy.  2. Left ventricular diastolic parameters are consistent with Grade I diastolic dysfunction (impaired relaxation).  3. Global right ventricle has normal systolic function.The right ventricular size is normal.  4. Right atrial size was normal.  5. The mitral valve is normal in structure. No evidence of mitral valve regurgitation. No evidence of mitral stenosis.  6. The tricuspid valve is normal in structure. Tricuspid valve regurgitation is trivial.  7. The aortic valve is normal in structure. Aortic valve regurgitation is not visualized. No evidence of aortic valve sclerosis or stenosis.  8. The pulmonic valve was normal in structure. Pulmonic valve regurgitation is not visualized.  9. Mildly elevated pulmonary artery systolic pressure. 10. The inferior vena cava is normal in size with greater than 50% respiratory variability, suggesting right atrial pressure of 3 mmHg. 11. Normal LV systolic function; moderate LVH; grade 1 diastolic dysfunction.   Condition - Extremely Guarded  Family Communication  : spouse on 11/21, 10/10/19  Code Status :  Full Code  Diet :   Diet Order            Diet Carb Modified Fluid consistency: Thin; Room service appropriate? Yes  Diet effective now               Disposition Plan  :  Remain hospitalized-suspect can be transferred to PCU later today if he remains stable on regular high flow.  Barriers to discharge: Hypoxia requiring O2 supplementation/complete 5 days of IV Remdesivir  Antimicorbials  :    Anti-infectives (From admission, onward)   Start     Dose/Rate Route Frequency Ordered Stop   10/04/19 1600  remdesivir 100  mg in sodium chloride 0.9 % 250 mL IVPB     100 mg 500 mL/hr over 30 Minutes Intravenous Daily 09/30/2019 2358 10/06/19 1140   10/02/2019 2200   remdesivir 100 mg in sodium chloride 0.9 % 250 mL IVPB  Status:  Discontinued     100 mg 500 mL/hr over 30 Minutes Intravenous Every 24 hours 10/01/2019 1537 10/04/2019 2358     DVT Prophylaxis  :  Lovenox    Inpatient Medications  Scheduled Meds:  aspirin EC  81 mg Oral Daily   atorvastatin  40 mg Oral q1800   Chlorhexidine Gluconate Cloth  6 each Topical Daily   dexamethasone (DECADRON) injection  6 mg Intravenous Q24H   dextrose  25 mL Intravenous Once   enoxaparin (LOVENOX) injection  100 mg Subcutaneous Q12H   famotidine  20 mg Oral Daily   insulin aspart  0-20 Units Subcutaneous TID WC   insulin detemir  10 Units Subcutaneous BID   Ipratropium-Albuterol  2 puff Inhalation Q6H   metoprolol tartrate  12.5 mg Oral BID   multivitamin with minerals  1 tablet Oral Daily   ticagrelor  90 mg Oral BID   vitamin B-12  100 mcg Oral Daily   vitamin C  500 mg Oral Daily   zinc sulfate  220 mg Oral Daily   Continuous Infusions:  PRN Meds:.acetaminophen, ALPRAZolam, lip balm, nitroGLYCERIN, ondansetron **OR** ondansetron (ZOFRAN) IV, sodium chloride, tetrahydrozoline  Time Spent in minutes  25   See all Orders from today for further details  Lala Lund M.D on 10/10/2019 at 11:41 AM  To page go to [email or web address] - use universal password  [facility] -  Office  [phone]    Objective:   Vitals:   10/10/19 0110 10/10/19 0316 10/10/19 0420 10/10/19 0747  BP:   118/61 (!) 130/58  Pulse:  68 80 76  Resp: (!) 28 (!) 27 (!) 23 (!) 24  Temp:   98.9 F (37.2 C) 98.3 F (36.8 C)  TempSrc:   Axillary Oral  SpO2: 94% 95% 99% 91%  Weight:      Height:        Wt Readings from Last 3 Encounters:  09/23/2019 102.1 kg  10/09/2019 102.1 kg  03/30/14 97.5 kg     Intake/Output Summary (Last 24 hours) at 10/10/2019 1141 Last data filed at 10/10/2019 0300 Gross per 24 hour  Intake 480 ml  Output 1150 ml  Net -670 ml     Physical Exam  Awake Alert,  Oriented X 3, No new F.N deficits, Normal affect [place].AT,PERRAL Supple Neck,No JVD, No cervical lymphadenopathy appriciated.  Symmetrical Chest wall movement, Mod air movement bilaterally RRR,No Gallops, Rubs or new Murmurs, No Parasternal Heave +ve B.Sounds, Abd Soft, No tenderness, No organomegaly appriciated, No rebound - guarding or rigidity. No Cyanosis, Clubbing or edema, No new Rash or bruise   Data Review:    CBC Recent Labs  Lab 10/05/19 0537 10/06/19 0442 10/07/19 0327 10/08/19 0140 10/09/19 0054 10/10/19 0255  WBC 10.6* 11.6* 10.4 11.0* 9.3 8.9  HGB 12.7* 12.5* 12.8* 12.6* 12.7* 12.6*  HCT 39.4 38.4* 39.8 39.2 39.6 40.0  PLT 408* 415* 463* 456* 477* 431*  MCV 77.7* 78.4* 78.0* 77.8* 78.1* 78.7*  MCH 25.0* 25.5* 25.1* 25.0* 25.0* 24.8*  MCHC 32.2 32.6 32.2 32.1 32.1 31.5  RDW 14.6 14.6 14.2 14.2 14.2 14.5  LYMPHSABS 0.8 0.6* 0.6* 0.5*  --  0.5*  MONOABS 0.7 0.6 0.5 0.3  --  0.1  EOSABS 0.0 0.0 0.0 0.0  --  0.2  BASOSABS 0.0 0.0 0.0 0.0  --  0.0    Chemistries  Recent Labs  Lab 10/05/19 0537 10/06/19 0442 10/07/19 0327 10/08/19 0140 10/09/19 0054 10/10/19 0255  NA 145 141 142 137 145 142  K 4.5 4.5 4.2 4.6 4.2 4.4  CL 107 107 106 102 110 108  CO2 25 22 23 23 24 25   GLUCOSE 72 109* 86 312* 102* 40*  BUN 53* 50* 51* 53* 42* 43*  CREATININE 1.53* 1.33* 1.42* 1.52* 1.32* 1.56*  CALCIUM 8.7* 8.4* 8.6* 8.5* 9.1 8.7*  MG 3.7* 3.2* 2.9* 2.8*  --  2.6*  AST 32 53* 40 29 46* 64*  ALT 18 30 35 30 38 48*  ALKPHOS 51 63 81 99 114 136*  BILITOT 0.4 0.4 0.6 0.9 0.4 0.6   ------------------------------------------------------------------------------------------------------------------ No results for input(s): CHOL, HDL, LDLCALC, TRIG, CHOLHDL, LDLDIRECT in the last 72 hours.  Lab Results  Component Value Date   HGBA1C 8.1 (H) 10/16/2019   ------------------------------------------------------------------------------------------------------------------ No  results for input(s): TSH, T4TOTAL, T3FREE, THYROIDAB in the last 72 hours.  Invalid input(s): FREET3 ------------------------------------------------------------------------------------------------------------------ Recent Labs    10/08/19 0140 10/09/19 0054  FERRITIN 314 325    Coagulation profile No results for input(s): INR, PROTIME in the last 168 hours.  Recent Labs    10/09/19 0054 10/10/19 0255  DDIMER >20.00* >20.00*    Cardiac Enzymes No results for input(s): CKMB, TROPONINI, MYOGLOBIN in the last 168 hours.  Invalid input(s): CK ------------------------------------------------------------------------------------------------------------------    Component Value Date/Time   BNP 63.4 10/10/2019 0255    Micro Results Recent Results (from the past 240 hour(s))  SARS CORONAVIRUS 2 (TAT 6-24 HRS) Nasopharyngeal Nasopharyngeal Swab     Status: Abnormal   Collection Time: 10/12/2019  6:09 PM   Specimen: Nasopharyngeal Swab  Result Value Ref Range Status   SARS Coronavirus 2 POSITIVE (A) NEGATIVE Final    Comment: RESULT CALLED TO, READ BACK BY AND VERIFIED WITH: Herby Abraham, RN AT 2324 ON 09/21/2019 BY SAINVILUS S (NOTE) SARS-CoV-2 target nucleic acids are DETECTED. The SARS-CoV-2 RNA is generally detectable in upper and lower respiratory specimens during the acute phase of infection. Positive results are indicative of active infection with SARS-CoV-2. Clinical  correlation with patient history and other diagnostic information is necessary to determine patient infection status. Positive results do  not rule out bacterial infection or co-infection with other viruses. The expected result is Negative. Fact Sheet for Patients: SugarRoll.be Fact Sheet for Healthcare Providers: https://www.woods-mathews.com/ This test is not yet approved or cleared by the [place] FDA and  has been authorized for detection and/or diagnosis of  SARS-CoV-2 by FDA under an Emergency Use Authorization (EUA). This EUA will remain  in effect (meaning this test can  be used) for the duration of the COVID-19 declaration under Section 564(b)(1) of the Act, 21 U.S.C. section 360bbb-3(b)(1), unless the authorization is terminated or revoked sooner. Performed at [facility], [place] [address], [place], [place] [address]   Blood culture (routine x 2)     Status: None   Collection Time: 10/08/2019  1:40 PM   Specimen: BLOOD  Result Value Ref Range Status   Specimen Description   Final    BLOOD RIGHT ANTECUBITAL Performed at [facility], [place] [address], [place], [place] [address]    Special Requests   Final    BOTTLES DRAWN AEROBIC AND ANAEROBIC Blood Culture adequate volume Performed at  [facility], [place] [address], [place], [place] [address]    Culture   Final    NO GROWTH 5 DAYS Performed at [facility], [place] [address] [place], [place] [address]    Report Status 10/08/2019 FINAL  Final  Blood culture (routine x 2)     Status: None   Collection Time: 09/26/2019  1:45 PM   Specimen: BLOOD RIGHT WRIST  Result Value Ref Range Status   Specimen Description   Final    BLOOD RIGHT WRIST Performed at [place] [address], [place], [place] [address]    Special Requests   Final    BOTTLES DRAWN AEROBIC AND ANAEROBIC Blood Culture adequate volume Performed at [place] [address] [place], [place] [address]    Culture   Final    NO GROWTH 5 DAYS Performed at [facility], [place] [address], [place], [place] [address]    Report Status 10/08/2019 FINAL  Final  MRSA PCR Screening     Status: None   Collection Time: 10/05/19  6:39 PM   Specimen: Nasopharyngeal  Result Value Ref Range Status   MRSA by PCR NEGATIVE NEGATIVE Final    Comment:        The GeneXpert MRSA Assay (FDA approved for NASAL specimens only), is one component of  a comprehensive MRSA colonization surveillance program. It is not intended to diagnose MRSA infection nor to guide or monitor treatment for MRSA infections. Performed at [facility], [place] [address], [place], [place] [address]     Radiology Reports [place]  Result Date: 10/09/2019 CLINICAL DATA:  COVID pneumonia, shortness of breath, concern for PE EXAM: CT ANGIOGRAPHY CHEST WITH CONTRAST TECHNIQUE: Multidetector CT imaging of the chest was performed using the standard protocol during bolus administration of intravenous contrast. Multiplanar CT image reconstructions and MIPs were obtained to evaluate the vascular anatomy. CONTRAST:  3mL OMNIPAQUE IOHEXOL 350 MG/ML SOLN COMPARISON:  None. FINDINGS: Cardiovascular: No significant filling defect or pulmonary embolus demonstrated by CTA. Pulmonary arteries appear patent. Limited assessment of the lower lobes with motion artifact from breathing. Minor aortic atherosclerosis. Negative for aneurysm or dissection. Patent two-vessel arch anatomy. No mediastinal hemorrhage or hematoma. Native coronary atherosclerosis noted. Heart size without pericardial effusion Mediastinum/Nodes: Prominent thyroid noted extending substernal. Trachea and esophagus unremarkable. Esophagus is nondilated. Mildly prominent prevascular, hilar and subcarinal lymph nodes, suspect reactive/inflammatory. No bulky adenopathy. Lungs/Pleura: Extensive diffuse and patchy ground-glass opacities throughout all lobes of both lungs with some sparing of the lung apices. There is associated bibasilar consolidation/atelectasis with central air bronchograms. Appearance compatible with atypical pneumonia/viral pneumonia as seen with COVID-19. No significant effusion. No other pleural abnormality or pneumothorax. Trachea and central airways remain patent. No mucous plugging or obstruction. Upper Abdomen: Fatty infiltration of the liver suspected. No acute finding in the upper  abdomen. Musculoskeletal: No chest wall abnormality. No acute or significant osseous findings. Review of the MIP images confirms the above findings. IMPRESSION: 1. Negative for significant acute pulmonary embolus by CTA. 2. Extensive diffuse and patchy ground-glass opacities throughout all lobes of both lungs with some sparing of the lung apices. Findings compatible with atypical pneumonia/viral pneumonia as seen with COVID-19. Aortic Atherosclerosis (ICD10-I70.0). Electronically Signed   By: Jerilynn Mages.  Shick M.D.   On: 10/09/2019 09:23   Dg Chest Port 1 View  Result Date: 10/04/2019 CLINICAL DATA:  Hypoxia EXAM: PORTABLE CHEST 1 VIEW COMPARISON:  09/29/2019 FINDINGS: Interstitial/patchy  opacities in the bilateral upper and lower lobes, unchanged. No definite pleural effusions. No pneumothorax. The heart is top-normal in size. IMPRESSION: Multifocal pneumonia in this patient with known COVID, unchanged. Electronically Signed   By: Julian Hy M.D.   On: 10/04/2019 10:07   Dg Chest Portable 1 View  Result Date: 10/04/2019 CLINICAL DATA:  Hypoxia, shortness of breath. EXAM: PORTABLE CHEST 1 VIEW COMPARISON:  October 02, 2019. FINDINGS: The heart size and mediastinal contours are within normal limits. No pneumothorax or pleural effusion is noted. Stable left perihilar and lingular opacity is noted as well as mild right upper lobe opacity consistent with multifocal pneumonia. The visualized skeletal structures are unremarkable. IMPRESSION: Stable bilateral lung opacities are noted, left greater than right, consistent with multifocal pneumonia. Electronically Signed   By: Marijo Conception M.D.   On: 09/22/2019 14:14   Dg Chest Port 1 View  Result Date: 10/13/2019 CLINICAL DATA:  Pt complains of chest pain x 3 days, non-radiating, worse with inspiration, cough, fever, and generally feeling unwell. Pt was unable to hold deep inspiration for x-ray due to coughing. EXAM: PORTABLE CHEST 1 VIEW COMPARISON:   01/07/2014 FINDINGS: Hazy interstitial and airspace opacities present in the left perihilar region, right upper lobe, and left lower lobe. Low lung volumes are present, causing crowding of the pulmonary vasculature. No blunting of the costophrenic angles. Cardiac and mediastinal margins appear normal. IMPRESSION: 1. Hazy interstitial and airspace opacities in the left perihilar region, right upper lobe, and left lower lobe, suspicious for multilobar pneumonia or atypical pneumonia. 2. Low lung volumes. Electronically Signed   By: Van Clines M.D.   On: 09/23/2019 18:57   Leg [place]  Result Date: 10/09/2019  Lower Venous Study Indications: Covid positive, elevated D-Dimer.  Comparison Study: Prior negative Bilateral LEV done 10/04/19 Performing Technologist: Sharion Dove RVS  Examination Guidelines: A complete evaluation includes B-mode imaging, spectral Doppler, color Doppler, and power Doppler as needed of all accessible portions of each vessel. Bilateral testing is considered an integral part of a complete examination. Limited examinations for reoccurring indications may be performed as noted.  +---------+---------------+---------+-----------+----------+--------------+  RIGHT     Compressibility Phasicity Spontaneity Properties Thrombus Aging  +---------+---------------+---------+-----------+----------+--------------+  CFV       Full            Yes       Yes                                    +---------+---------------+---------+-----------+----------+--------------+  SFJ       Full                                                             +---------+---------------+---------+-----------+----------+--------------+  FV Prox   Full                                                             +---------+---------------+---------+-----------+----------+--------------+  FV Mid    Full                                                              +---------+---------------+---------+-----------+----------+--------------+  FV Distal Full                                                             +---------+---------------+---------+-----------+----------+--------------+  PFV       Full                                                             +---------+---------------+---------+-----------+----------+--------------+  POP       Full            Yes       Yes                                    +---------+---------------+---------+-----------+----------+--------------+  PTV       Full                                                             +---------+---------------+---------+-----------+----------+--------------+  PERO      Full                                                             +---------+---------------+---------+-----------+----------+--------------+  Gastroc   None                                             Acute           +---------+---------------+---------+-----------+----------+--------------+   +---------+---------------+---------+-----------+----------+--------------+  LEFT      Compressibility Phasicity Spontaneity Properties Thrombus Aging  +---------+---------------+---------+-----------+----------+--------------+  CFV       Full            Yes       Yes                                    +---------+---------------+---------+-----------+----------+--------------+  SFJ       Full                                                             +---------+---------------+---------+-----------+----------+--------------+  FV Prox   Full                                                             +---------+---------------+---------+-----------+----------+--------------+  FV Mid    Full                                                             +---------+---------------+---------+-----------+----------+--------------+  FV Distal Full                                                              +---------+---------------+---------+-----------+----------+--------------+  PFV       Full                                                             +---------+---------------+---------+-----------+----------+--------------+  POP       Full            Yes       Yes                                    +---------+---------------+---------+-----------+----------+--------------+  PTV       Full                                                             +---------+---------------+---------+-----------+----------+--------------+  PERO      Full                                                             +---------+---------------+---------+-----------+----------+--------------+  Gastroc   None                                             Acute           +---------+---------------+---------+-----------+----------+--------------+     Summary: Right: Findings consistent with acute deep vein thrombosis involving the right gastrocnemius veins. Left: Findings consistent with acute deep vein thrombosis involving the left gastrocnemius veins.  *See table(s) above for measurements and observations.    Preliminary    Le Venous  Result Date: 10/04/2019  Lower Venous Study Indications: Covid-19 positive, and Swelling.  Anticoagulation: Lovenox. Comparison Study: No priors Performing Technologist: Velva Harman Sturdivant RDMS, RVT  Examination Guidelines: A complete evaluation includes B-mode imaging, spectral Doppler, color Doppler, and power Doppler as needed of all accessible portions of each vessel. Bilateral testing is considered an integral part of a complete examination. Limited examinations for reoccurring indications may be performed as noted.  +---------+---------------+---------+-----------+----------+--------------+  RIGHT     Compressibility Phasicity Spontaneity Properties Thrombus  Aging  +---------+---------------+---------+-----------+----------+--------------+  CFV       Full            Yes       Yes                                     +---------+---------------+---------+-----------+----------+--------------+  SFJ       Full                                                             +---------+---------------+---------+-----------+----------+--------------+  FV Prox   Full                                                             +---------+---------------+---------+-----------+----------+--------------+  FV Mid    Full                                                             +---------+---------------+---------+-----------+----------+--------------+  FV Distal Full                                                             +---------+---------------+---------+-----------+----------+--------------+  PFV       Full                                                             +---------+---------------+---------+-----------+----------+--------------+  POP       Full            No        Yes                                    +---------+---------------+---------+-----------+----------+--------------+  PTV       Full                                                             +---------+---------------+---------+-----------+----------+--------------+  PERO      Full                                                             +---------+---------------+---------+-----------+----------+--------------+   +---------+---------------+---------+-----------+----------+--------------+  LEFT      Compressibility Phasicity Spontaneity Properties Thrombus Aging  +---------+---------------+---------+-----------+----------+--------------+  CFV       Full            Yes       Yes                                    +---------+---------------+---------+-----------+----------+--------------+  SFJ       Full                                                             +---------+---------------+---------+-----------+----------+--------------+  FV Prox   Full                                                              +---------+---------------+---------+-----------+----------+--------------+  FV Mid    Full                                                             +---------+---------------+---------+-----------+----------+--------------+  FV Distal Full                                                             +---------+---------------+---------+-----------+----------+--------------+  PFV       Full                                                             +---------+---------------+---------+-----------+----------+--------------+  POP       Full            Yes       Yes                                    +---------+---------------+---------+-----------+----------+--------------+  PTV       Full                                                             +---------+---------------+---------+-----------+----------+--------------+  PERO      Full                                                             +---------+---------------+---------+-----------+----------+--------------+  Summary: Right: There is no evidence of deep vein thrombosis in the lower extremity. No cystic structure found in the popliteal fossa. Left: There is no evidence of deep vein thrombosis in the lower extremity. No cystic structure found in the popliteal fossa.  *See table(s) above for measurements and observations. Electronically signed by Ruta Hinds MD on 10/04/2019 at 3:31:58 PM.    Final

## 2019-10-10 NOTE — Progress Notes (Signed)
Pt's O2 sat dropped to the 70s after using the urinal at beside.  Pt was encouraged to take deep breathes but sats remained around 83% on 15L non-rebreather.  Pt put on 15L HFNC and non -rebreather to have O2 above 94%.  ICU charge RN requested to come assess pt and Dr. Shanon Brow paged about pt's increased O2 requirement.  Pt maintains mentation and is breathing comfortably.  Will continue to monitor pt's condition.

## 2019-10-10 NOTE — Evaluation (Signed)
Occupational Therapy Evaluation Patient Details Name: William [name] MRN: [id number] DOB: 1956-07-08 Today's Date: 10/10/2019    History of Present Illness Patient is a 63 y.o. male with PMHx of HTN, dyslipidemia, DM-2, CAD-who presented with shortness of breath and cough-he was found to have acute hypoxic respiratory failure secondary to COVID-19 pneumonia.   Clinical Impression   This 63 y/o male presents with the above. PTA pt was independent with ADL, iADL and functional mobility. Pt mostly limited due to decreased respiratory status at this time. Pt on 15L HFNC and NRB with activity today and overall able to maintain VSS throughout activity (lowest O2 sat in the mid 80s). Pt performing functional transfers at supervision level, requiring minguard-minA for LB ADL. Pt requires rest breaks with minimal activity but is very motivated to return to his PLOF. He will benefit from continued acute OT services, currently recommend follow up [facility] services after discharge (may progress to no follow up). Will follow.     Follow Up Recommendations  Home health OT;Supervision/Assistance - 24 hour(pending progress, may progress to no follow up)    Equipment Recommendations  Other (comment)(may benefit from shower seat for energy conservation)           Precautions / Restrictions Precautions Precaution Comments: monitor SPO2, try weaning Restrictions Weight Bearing Restrictions: No      Mobility Bed Mobility               General bed mobility comments: pt seated EOB upon arrival  Transfers Overall transfer level: Needs assistance Equipment used: None Transfers: Sit to/from Stand;Stand Pivot Transfers Sit to Stand: Supervision Stand pivot transfers: Supervision       General transfer comment: for lines and safety, no LOB noted    Balance Overall balance assessment: No apparent balance deficits (not formally assessed)                                          ADL either performed or assessed with clinical judgement   ADL Overall ADL's : Needs assistance/impaired Eating/Feeding: Modified independent;Sitting   Grooming: Set up;Sitting;Wash/dry face   Upper Body Bathing: Set up;Sitting   Lower Body Bathing: Min guard;Sit to/from stand   Upper Body Dressing : Set up;Sitting   Lower Body Dressing: Min guard;Minimal assistance;Sit to/from stand   Toilet Transfer: Supervision/safety;Stand-pivot Armed forces technical officer Details (indicate cue type and reason): simulated via transfer to [place] and Hygiene: Min guard;Sit to/from stand Toileting - Clothing Manipulation Details (indicate cue type and reason): pt has been using urinal PRN     Functional mobility during ADLs: Supervision/safety General ADL Comments: pt mostly limited due to impaired respiratory status at this time, requires increased time for all activities due to poor endurance     Vision         Perception     Praxis      Pertinent Vitals/Pain Pain Assessment: No/denies pain     Hand Dominance     Extremity/Trunk Assessment Upper Extremity Assessment Upper Extremity Assessment: Overall WFL for tasks assessed(mild weakness (compared to baseline))   Lower Extremity Assessment Lower Extremity Assessment: Defer to PT evaluation       Communication Communication Communication: No difficulties   Cognition Arousal/Alertness: Awake/alert Behavior During Therapy: WFL for tasks assessed/performed Overall Cognitive Status: Within Functional Limits for tasks assessed  General Comments: very motivated   General Comments       Exercises Exercises: General Upper Extremity;General Lower Extremity;Other exercises General Exercises - Upper Extremity Shoulder Flexion: AROM;5 reps;Theraband Theraband Level (Shoulder Flexion): Level 2 (Red) Shoulder Horizontal ABduction: AROM;Both;5  reps;Seated;Theraband Theraband Level (Shoulder Horizontal Abduction): Level 2 (Red) Shoulder Horizontal ADduction: AROM;5 reps;Both;Seated;Theraband Theraband Level (Shoulder Horizontal Adduction): Level 2 (Red) Elbow Flexion: AROM;Both;5 reps;Theraband Theraband Level (Elbow Flexion): Level 2 (Red) General Exercises - Lower Extremity Long Arc Quad: AROM;Both;5 reps;Seated   Shoulder Instructions      Home Living Family/patient expects to be discharged to:: Private residence Living Arrangements: Spouse/significant other;Children Available Help at Discharge: Family Type of Home: House Home Access: Stairs to enter [employer] of Steps: 2   Home Layout: Two level;Able to live on main level with bedroom/bathroom     Bathroom Shower/Tub: Occupational psychologist: Standard     Home Equipment: None          Prior Functioning/Environment Level of Independence: Independent        Comments: works        OT Problem List: Decreased strength;Decreased activity tolerance;Cardiopulmonary status limiting activity;Decreased knowledge of use of DME or AE      OT Treatment/Interventions: Self-care/ADL training;Therapeutic exercise;Energy conservation;DME and/or AE instruction;Therapeutic activities;Patient/family education;Balance training    OT Goals(Current goals can be found in the care plan section) Acute Rehab OT Goals Patient Stated Goal: to be able to walk in the park OT Goal Formulation: With patient Time For Goal Achievement: 10/24/19 Potential to Achieve Goals: Good  OT Frequency: Min 2X/week   Barriers to D/C:            Co-evaluation              AM-PAC OT "6 Clicks" Daily Activity     Outcome Measure Help from another person eating meals?: None Help from another person taking care of personal grooming?: None Help from another person toileting, which includes using toliet, bedpan, or urinal?: A Little Help from another person bathing  (including washing, rinsing, drying)?: A Little Help from another person to put on and taking off regular upper body clothing?: None Help from another person to put on and taking off regular lower body clothing?: A Little 6 Click Score: 21   End of Session Equipment Utilized During Treatment: Oxygen Nurse Communication: Mobility status  Activity Tolerance: Patient tolerated treatment well Patient left: in chair;with call bell/phone within reach  OT Visit Diagnosis: Muscle weakness (generalized) (M62.81);Other (comment)(decreased activity tolerance)                Time: [id number] OT Time Calculation (min): 34 min Charges:  OT General Charges $OT Visit: 1 Visit OT Evaluation $OT Eval Moderate Complexity: 1 Mod OT Treatments $Self Care/Home Management : 8-22 mins  Lou Cal, OT Supplemental Rehabilitation Services Pager [phone] Office [phone]   [name] 10/10/2019, 2:59 PM

## 2019-10-10 NOTE — Progress Notes (Signed)
Inpatient Diabetes Program Recommendations  AACE/ADA: New Consensus Statement on Inpatient Glycemic Control (2015)  Target Ranges:  Prepandial:   less than 140 mg/dL      Peak postprandial:   less than 180 mg/dL (1-2 hours)      Critically ill patients:  140 - 180 mg/dL   Lab Results  Component Value Date   GLUCAP 414 (H) 10/10/2019   HGBA1C 8.1 (H) 09/30/2019    Review of Glycemic Control Results for William [name], William [name] (MRN [id number]) as of 10/10/2019 12:44  Ref. Range 10/09/2019 16:26 10/09/2019 21:28 10/10/2019 07:19 10/10/2019 11:07  Glucose-Capillary Latest Ref Range: 70 - 99 mg/dL 354 (H) 222 (H) 76 414 (H)   Diabetes history: Type 2 Dm Outpatient Diabetes medications: Jardiance 25 mg QD, Jentadueto 2.03-999 mg BID Current orders for Inpatient glycemic control: Levemir 10 units BID, Novolog 0-15 units TID, Novolog 0-5 units QHS Decadron 6 mg QD  Inpatient Diabetes Program Recommendations:    Noted hypoglycemia this AM of 40 mg/dL. Lunchtime CBG was 414 mg/dL, unclear of total consumed carbohydrates.  Anticipate in the setting of steroids, patient will need a portion of meal coverage. Changes made due to hypoglycemia.  Consider adding Novolog 4 units TID (assuming patient is consuming >50% of meal).  Secure chat sent to MD.   Thanks, Bronson Curb, MSN, RNC-OB Diabetes Coordinator [phone] (8a-5p)

## 2019-10-10 NOTE — Progress Notes (Signed)
Lab reported critical lab with glucose of  40. POC Bg taken with off going RN, BG 76. Pt given oj. Pt currently sitting at beside, asymptomatic, HFNC and non-rebreather in place. Attempt made to remove non-rebreather and patient desated into the 70's. Non-rebreather replaced and pt recovered to baseline. Will continue to monitor.

## 2019-10-10 NOTE — Progress Notes (Signed)
patient transferred self from chair to bed and using urinal and NRB mask on patient's forehead.  Sats in 60's, patient SOB.  Placed NRB correctly and encouraged patient to get in bed in prone position.  Patient able to tolerate proning, sats increased.

## 2019-10-11 LAB — GLUCOSE, CAPILLARY
Glucose-Capillary: 275 mg/dL — ABNORMAL HIGH (ref 70–99)
Glucose-Capillary: 377 mg/dL — ABNORMAL HIGH (ref 70–99)
Glucose-Capillary: 312 mg/dL — ABNORMAL HIGH (ref 70–99)
Glucose-Capillary: 221 mg/dL — ABNORMAL HIGH (ref 70–99)
Glucose-Capillary: 202 mg/dL — ABNORMAL HIGH (ref 70–99)

## 2019-10-11 LAB — CBC WITH DIFFERENTIAL/PLATELET
Abs Immature Granulocytes: 0.07 10*3/uL (ref 0.00–0.07)
Basophils Absolute: 0 10*3/uL (ref 0.0–0.1)
Basophils Relative: 0 %
Eosinophils Absolute: 0 10*3/uL (ref 0.0–0.5)
Eosinophils Relative: 0 %
HCT: 37.4 % — ABNORMAL LOW (ref 39.0–52.0)
Hemoglobin: 11.9 g/dL — ABNORMAL LOW (ref 13.0–17.0)
Immature Granulocytes: 1 %
Lymphocytes Relative: 2 %
Lymphs Abs: 0.3 10*3/uL — ABNORMAL LOW (ref 0.7–4.0)
MCH: 24.8 pg — ABNORMAL LOW (ref 26.0–34.0)
MCHC: 31.8 g/dL (ref 30.0–36.0)
MCV: 77.9 fL — ABNORMAL LOW (ref 80.0–100.0)
Monocytes Absolute: 0.1 10*3/uL (ref 0.1–1.0)
Monocytes Relative: 1 %
Neutro Abs: 10.5 10*3/uL — ABNORMAL HIGH (ref 1.7–7.7)
Neutrophils Relative %: 96 %
Platelets: 441 10*3/uL — ABNORMAL HIGH (ref 150–400)
RBC: 4.8 MIL/uL (ref 4.22–5.81)
RDW: 14.4 % (ref 11.5–15.5)
WBC: 11 10*3/uL — ABNORMAL HIGH (ref 4.0–10.5)
nRBC: 0.4 % — ABNORMAL HIGH (ref 0.0–0.2)

## 2019-10-11 LAB — COMPREHENSIVE METABOLIC PANEL
ALT: 47 U/L — ABNORMAL HIGH (ref 0–44)
AST: 49 U/L — ABNORMAL HIGH (ref 15–41)
Albumin: 3 g/dL — ABNORMAL LOW (ref 3.5–5.0)
Alkaline Phosphatase: 156 U/L — ABNORMAL HIGH (ref 38–126)
Anion gap: 10 (ref 5–15)
BUN: 51 mg/dL — ABNORMAL HIGH (ref 8–23)
CO2: 22 mmol/L (ref 22–32)
Calcium: 8.5 mg/dL — ABNORMAL LOW (ref 8.9–10.3)
Chloride: 104 mmol/L (ref 98–111)
Creatinine, Ser: 1.6 mg/dL — ABNORMAL HIGH (ref 0.61–1.24)
GFR calc Af Amer: 53 mL/min — ABNORMAL LOW (ref >60)
GFR calc non Af Amer: 45 mL/min — ABNORMAL LOW (ref >60)
Glucose, Bld: 264 mg/dL — ABNORMAL HIGH (ref 70–99)
Potassium: 5 mmol/L (ref 3.5–5.1)
Sodium: 136 mmol/L (ref 135–145)
Total Bilirubin: 0.8 mg/dL (ref 0.3–1.2)
Total Protein: 6.9 g/dL (ref 6.5–8.1)

## 2019-10-11 LAB — OSMOLALITY: Osmolality: 313 mOsm/kg — ABNORMAL HIGH (ref 275–295)

## 2019-10-11 LAB — C-REACTIVE PROTEIN: CRP: 4.3 mg/dL — ABNORMAL HIGH (ref <1.0)

## 2019-10-11 LAB — D-DIMER, QUANTITATIVE: D-Dimer, Quant: >20 ug/mL-FEU — ABNORMAL HIGH (ref 0.00–0.50)

## 2019-10-11 LAB — MAGNESIUM: Magnesium: 2.7 mg/dL — ABNORMAL HIGH (ref 1.7–2.4)

## 2019-10-11 LAB — BRAIN NATRIURETIC PEPTIDE: B Natriuretic Peptide: 68.6 pg/mL (ref 0.0–100.0)

## 2019-10-11 MED ORDER — INSULIN ASPART 100 UNIT/ML ~~LOC~~ SOLN
3.0000 [IU] | Freq: Three times a day (TID) | SUBCUTANEOUS | Status: DC
  Administered 2019-10-11 – 2019-10-13 (×7): 3 [IU] via SUBCUTANEOUS

## 2019-10-11 MED ORDER — INSULIN DETEMIR 100 UNIT/ML ~~LOC~~ SOLN
15.0000 [IU] | Freq: Two times a day (BID) | SUBCUTANEOUS | Status: DC
  Administered 2019-10-11 – 2019-10-14 (×7): 15 [IU] via SUBCUTANEOUS
  Filled 2019-10-11 (×8): qty 0.15

## 2019-10-11 NOTE — Progress Notes (Signed)
PROGRESS NOTE                                                                                                                                                                                                             Patient Demographics:    William [name], is a 63 y.o. male, DOB - 02/09/1956, [id number]  Outpatient Primary MD for the patient is [name], MD   Admit date - 10/11/2019   LOS - 8  Chief Complaint  Patient presents with   COVID positive   Shortness of Breath       Brief Narrative: Patient is a 63 y.o. male with PMHx of HTN, dyslipidemia, DM-2, CAD-who presented with shortness of breath and cough-he was found to have acute hypoxic respiratory failure secondary to COVID-19 pneumonia.  Significant events: 11/16>> admit to [facility] 11/17>> transferred to ICU for worsening hypoxemia requiring heated high flow 11/20>> transferred back to PCU  COVID-19 Medications: Steroids:11/16>> Remdesivir:11/15>>11/19 Actemra:11/17>>x 1 Convalescent Plasma:11/17>>x1   Subjective:   Patient in bed, appears comfortable, denies any headache, no fever, no chest pain or pressure, +ve shortness of breath , no abdominal pain. No focal weakness.    Assessment  & Plan :   Acute Hypoxic Resp Failure due to Covid 19 Viral pneumonia: has severe pulmonary disease, he has been treated so far with steroids, remdesivir, convalescent plasma along with Actemra, he was treated in ICU and stabilized and transferred to my care on 10/09/2019 on combination of high flow oxygen and nonrebreather mask.    He is still extremely hypoxic and still requiring both high flow oxygen and nonrebreather mask.  His inflammatory markers are rising again hence he was switched from Decadron to high-dose Solu-Medrol on the 23rd, he continues to remain very tenuous he might require getting transferred back to ICU if he does not turn around.  Updated  wife again on the 24th about his poor prognosis.  Case also discussed with pulmonary physician Dr. Lake Bells on 24th.  If he gets any worse he might require being transferred back to ICU.  Was encouraged to sit up in chair in the daytime use I-S and flutter valve for pulmonary toiletry and then prone at bed at night.  SpO2: 94 % O2 Flow Rate (L/min): 15 L/min FiO2 (%): 60 %   COVID-19 Labs: Recent Labs  10/09/19 0054 10/10/19 0255 10/11/19 0256  DDIMER >20.00* >20.00* >20.00*  FERRITIN 325  --   --   CRP 1.4* 3.2* 4.3*    Lab Results  Component Value Date   SARSCOV2NAA POSITIVE (A) 10/16/2019     AKI: Improved-suspect may have developed some mild CKD stage III , renal function seems to have stabilized with a creatinine of around 1.4.  Chronic diastolic heart failure: Compensated- received IV Lasix on the 23rd, dosing as needed.  HTN: Controlled-continue metoprolol  Dyslipidemia: Continue statin  History of CAD: No anginal symptoms-continue dual antiplatelet agents, statin and metoprolol.  Remote history of DVT: ++ D dimer , negative CT and lower extremity venous duplex.  Will continue full dose Lovenox for now till D-dimer starts to trend down.  Obesity: BMI 35.  Follow with PCP for weight loss.   DM-2 (A1c 8.1 on 09/20/2019): Poor outpatient control due to hyperglycemia.  On Lantus sliding scale and premeal NovoLog.  Monitor and adjust.  Have increased Premeal NovoLog on the 24th.  CBG (last 3)  Recent Labs    10/10/19 2018 10/11/19 0750 10/11/19 0942  GLUCAP 295* 275* 377*    GI prophylaxis: H2 Blocker  Consults  :  PCCM  Procedures  :    CTA and Leg [place] - No Clots  TTE -   1. Left ventricular ejection fraction, by visual estimation, is 60 to 65%. The left ventricle has normal function. There is moderately increased left ventricular hypertrophy.  2. Left ventricular diastolic parameters are consistent with Grade I diastolic dysfunction (impaired  relaxation).  3. Global right ventricle has normal systolic function.The right ventricular size is normal.  4. Right atrial size was normal.  5. The mitral valve is normal in structure. No evidence of mitral valve regurgitation. No evidence of mitral stenosis.  6. The tricuspid valve is normal in structure. Tricuspid valve regurgitation is trivial.  7. The aortic valve is normal in structure. Aortic valve regurgitation is not visualized. No evidence of aortic valve sclerosis or stenosis.  8. The pulmonic valve was normal in structure. Pulmonic valve regurgitation is not visualized.  9. Mildly elevated pulmonary artery systolic pressure. 10. The inferior vena cava is normal in size with greater than 50% respiratory variability, suggesting right atrial pressure of 3 mmHg. 11. Normal LV systolic function; moderate LVH; grade 1 diastolic dysfunction.   Condition - Extremely Guarded  Family Communication  : spouse on 11/21, 10/10/19  Code Status :  Full Code  Diet :   Diet Order            Diet Carb Modified Fluid consistency: Thin; Room service appropriate? Yes  Diet effective now               Disposition Plan  :  Remain hospitalized-suspect can be transferred to PCU later today if he remains stable on regular high flow.  Barriers to discharge: Hypoxia requiring O2 supplementation/complete 5 days of IV Remdesivir  Antimicorbials  :    Anti-infectives (From admission, onward)   Start     Dose/Rate Route Frequency Ordered Stop   10/04/19 1600  remdesivir 100 mg in sodium chloride 0.9 % 250 mL IVPB     100 mg 500 mL/hr over 30 Minutes Intravenous Daily 10/09/2019 2358 10/06/19 1140   10/08/2019 2200  remdesivir 100 mg in sodium chloride 0.9 % 250 mL IVPB  Status:  Discontinued     100 mg 500 mL/hr over 30 Minutes Intravenous Every 24 hours 09/19/2019  1537 09/21/2019 2358     DVT Prophylaxis  :  Lovenox    Inpatient Medications  Scheduled Meds:  aspirin EC  81 mg Oral Daily    atorvastatin  40 mg Oral q1800   Chlorhexidine Gluconate Cloth  6 each Topical Daily   dextrose  25 mL Intravenous Once   enoxaparin (LOVENOX) injection  100 mg Subcutaneous Q12H   famotidine  20 mg Oral Daily   insulin aspart  0-15 Units Subcutaneous TID WC   insulin aspart  0-5 Units Subcutaneous QHS   insulin detemir  10 Units Subcutaneous BID   Ipratropium-Albuterol  2 puff Inhalation Q6H   methylPREDNISolone (SOLU-MEDROL) injection  60 mg Intravenous Q12H   metoprolol tartrate  12.5 mg Oral BID   multivitamin with minerals  1 tablet Oral Daily   ticagrelor  90 mg Oral BID   vitamin B-12  100 mcg Oral Daily   vitamin C  500 mg Oral Daily   zinc sulfate  220 mg Oral Daily   Continuous Infusions:  PRN Meds:.acetaminophen, ALPRAZolam, lip balm, nitroGLYCERIN, ondansetron **OR** ondansetron (ZOFRAN) IV, sodium chloride, tetrahydrozoline  Time Spent in minutes  25   See all Orders from today for further details  Lala Lund M.D on 10/11/2019 at 11:01 AM  To page go to [email or web address] - use universal password  [facility] -  Office  [phone]    Objective:   Vitals:   10/11/19 0208 10/11/19 0515 10/11/19 0800 10/11/19 0947  BP: 129/71 106/75  126/74  Pulse: 67 66  62  Resp: 20 18    Temp: 97.7 F (36.5 C) 97.9 F (36.6 C) 99.1 F (37.3 C)   TempSrc: Axillary Oral Oral   SpO2: 96% 97%  94%  Weight:      Height:        Wt Readings from Last 3 Encounters:  09/22/2019 102.1 kg  09/20/2019 102.1 kg  03/30/14 97.5 kg     Intake/Output Summary (Last 24 hours) at 10/11/2019 1101 Last data filed at 10/11/2019 0518 Gross per 24 hour  Intake --  Output 1825 ml  Net -1825 ml     Physical Exam  Awake , mildly confused, No new F.N deficits, Normal affect [place].AT,PERRAL Supple Neck,No JVD, No cervical lymphadenopathy appriciated.  Symmetrical Chest wall movement, Good air movement bilaterally, mild rales RRR,No Gallops, Rubs or new Murmurs, No  Parasternal Heave +ve B.Sounds, Abd Soft, No tenderness, No organomegaly appriciated, No rebound - guarding or rigidity. No Cyanosis, Clubbing or edema, No new Rash or bruise    Data Review:    CBC  Recent Labs  Lab 10/06/19 0442 10/07/19 0327 10/08/19 0140 10/09/19 0054 10/10/19 0255 10/11/19 0256  WBC 11.6* 10.4 11.0* 9.3 8.9 11.0*  HGB 12.5* 12.8* 12.6* 12.7* 12.6* 11.9*  HCT 38.4* 39.8 39.2 39.6 40.0 37.4*  PLT 415* 463* 456* 477* 431* 441*  MCV 78.4* 78.0* 77.8* 78.1* 78.7* 77.9*  MCH 25.5* 25.1* 25.0* 25.0* 24.8* 24.8*  MCHC 32.6 32.2 32.1 32.1 31.5 31.8  RDW 14.6 14.2 14.2 14.2 14.5 14.4  LYMPHSABS 0.6* 0.6* 0.5*  --  0.5* 0.3*  MONOABS 0.6 0.5 0.3  --  0.1 0.1  EOSABS 0.0 0.0 0.0  --  0.2 0.0  BASOSABS 0.0 0.0 0.0  --  0.0 0.0    Chemistries   Recent Labs  Lab 10/06/19 0442 10/07/19 0327 10/08/19 0140 10/09/19 0054 10/10/19 0255 10/11/19 0256  NA 141 142 137 145 142 136  K 4.5 4.2  4.6 4.2 4.4 5.0  CL 107 106 102 110 108 104  CO2 22 23 23 24 25 22   GLUCOSE 109* 86 312* 102* 40* 264*  BUN 50* 51* 53* 42* 43* 51*  CREATININE 1.33* 1.42* 1.52* 1.32* 1.56* 1.60*  CALCIUM 8.4* 8.6* 8.5* 9.1 8.7* 8.5*  MG 3.2* 2.9* 2.8*  --  2.6* 2.7*  AST 53* 40 29 46* 64* 49*  ALT 30 35 30 38 48* 47*  ALKPHOS 63 81 99 114 136* 156*  BILITOT 0.4 0.6 0.9 0.4 0.6 0.8   ------------------------------------------------------------------------------------------------------------------ No results for input(s): CHOL, HDL, LDLCALC, TRIG, CHOLHDL, LDLDIRECT in the last 72 hours.  Lab Results  Component Value Date   HGBA1C 8.1 (H) 10/08/2019   ------------------------------------------------------------------------------------------------------------------ No results for input(s): TSH, T4TOTAL, T3FREE, THYROIDAB in the last 72 hours.  Invalid input(s):  FREET3 ------------------------------------------------------------------------------------------------------------------ Recent Labs    10/09/19 0054  FERRITIN 325    Coagulation profile No results for input(s): INR, PROTIME in the last 168 hours.  Recent Labs    10/10/19 0255 10/11/19 0256  DDIMER >20.00* >20.00*    Cardiac Enzymes No results for input(s): CKMB, TROPONINI, MYOGLOBIN in the last 168 hours.  Invalid input(s): CK ------------------------------------------------------------------------------------------------------------------    Component Value Date/Time   BNP 68.6 10/11/2019 0256    Micro Results Recent Results (from the past 240 hour(s))  SARS CORONAVIRUS 2 (TAT 6-24 HRS) Nasopharyngeal Nasopharyngeal Swab     Status: Abnormal   Collection Time: 10/01/2019  6:09 PM   Specimen: Nasopharyngeal Swab  Result Value Ref Range Status   SARS Coronavirus 2 POSITIVE (A) NEGATIVE Final    Comment: RESULT CALLED TO, READ BACK BY AND VERIFIED WITH: Herby Abraham, RN AT 2324 ON 10/14/2019 BY SAINVILUS S (NOTE) SARS-CoV-2 target nucleic acids are DETECTED. The SARS-CoV-2 RNA is generally detectable in upper and lower respiratory specimens during the acute phase of infection. Positive results are indicative of active infection with SARS-CoV-2. Clinical  correlation with patient history and other diagnostic information is necessary to determine patient infection status. Positive results do  not rule out bacterial infection or co-infection with other viruses. The expected result is Negative. Fact Sheet for Patients: SugarRoll.be Fact Sheet for Healthcare Providers: https://www.woods-mathews.com/ This test is not yet approved or cleared by the [place] FDA and  has been authorized for detection and/or diagnosis of SARS-CoV-2 by FDA under an Emergency Use Authorization (EUA). This EUA will remain  in effect (meaning this test  can  be used) for the duration of the COVID-19 declaration under Section 564(b)(1) of the Act, 21 U.S.C. section 360bbb-3(b)(1), unless the authorization is terminated or revoked sooner. Performed at [facility], [place] [address], [place], [place] [address]   Blood culture (routine x 2)     Status: None   Collection Time: 09/20/2019  1:40 PM   Specimen: BLOOD  Result Value Ref Range Status   Specimen Description   Final    BLOOD RIGHT ANTECUBITAL Performed at [facility], [place] [address] [place], [place] [address]    Special Requests   Final    BOTTLES DRAWN AEROBIC AND ANAEROBIC Blood Culture adequate volume Performed at [facility] [address], [place], [place] [address]    Culture   Final    NO GROWTH 5 DAYS Performed at [facility], [place] [address], [place], [place] [address]    Report Status 10/08/2019 FINAL  Final  Blood culture (routine x 2)  Status: None   Collection Time: 10/09/2019  1:45 PM   Specimen: BLOOD RIGHT WRIST  Result Value Ref Range Status   Specimen Description   Final    BLOOD RIGHT WRIST Performed at [facility] [address], [place], [place] [address]    Special Requests   Final    BOTTLES DRAWN AEROBIC AND ANAEROBIC Blood Culture adequate volume Performed at [facility] [address] [place], [place] [address]    Culture   Final    NO GROWTH 5 DAYS Performed at [facility], [place] [address] [place], [place] [address]    Report Status 10/08/2019 FINAL  Final  MRSA PCR Screening     Status: None   Collection Time: 10/05/19  6:39 PM   Specimen: Nasopharyngeal  Result Value Ref Range Status   MRSA by PCR NEGATIVE NEGATIVE Final    Comment:        The GeneXpert MRSA Assay (FDA approved for NASAL specimens only), is one component of a comprehensive MRSA colonization surveillance program. It is not intended to diagnose MRSA infection nor to guide  or monitor treatment for MRSA infections. Performed at [facility], [place] [address], [place], [place] [address]     Radiology Reports [place]  Result Date: 10/09/2019 CLINICAL DATA:  COVID pneumonia, shortness of breath, concern for PE EXAM: CT ANGIOGRAPHY CHEST WITH CONTRAST TECHNIQUE: Multidetector CT imaging of the chest was performed using the standard protocol during bolus administration of intravenous contrast. Multiplanar CT image reconstructions and MIPs were obtained to evaluate the vascular anatomy. CONTRAST:  62mL OMNIPAQUE IOHEXOL 350 MG/ML SOLN COMPARISON:  None. FINDINGS: Cardiovascular: No significant filling defect or pulmonary embolus demonstrated by CTA. Pulmonary arteries appear patent. Limited assessment of the lower lobes with motion artifact from breathing. Minor aortic atherosclerosis. Negative for aneurysm or dissection. Patent two-vessel arch anatomy. No mediastinal hemorrhage or hematoma. Native coronary atherosclerosis noted. Heart size without pericardial effusion Mediastinum/Nodes: Prominent thyroid noted extending substernal. Trachea and esophagus unremarkable. Esophagus is nondilated. Mildly prominent prevascular, hilar and subcarinal lymph nodes, suspect reactive/inflammatory. No bulky adenopathy. Lungs/Pleura: Extensive diffuse and patchy ground-glass opacities throughout all lobes of both lungs with some sparing of the lung apices. There is associated bibasilar consolidation/atelectasis with central air bronchograms. Appearance compatible with atypical pneumonia/viral pneumonia as seen with COVID-19. No significant effusion. No other pleural abnormality or pneumothorax. Trachea and central airways remain patent. No mucous plugging or obstruction. Upper Abdomen: Fatty infiltration of the liver suspected. No acute finding in the upper abdomen. Musculoskeletal: No chest wall abnormality. No acute or significant osseous findings. Review of the MIP images  confirms the above findings. IMPRESSION: 1. Negative for significant acute pulmonary embolus by CTA. 2. Extensive diffuse and patchy ground-glass opacities throughout all lobes of both lungs with some sparing of the lung apices. Findings compatible with atypical pneumonia/viral pneumonia as seen with COVID-19. Aortic Atherosclerosis (ICD10-I70.0). Electronically Signed   By: Jerilynn Mages.  Shick M.D.   On: 10/09/2019 09:23   Dg Chest Port 1 View  Result Date: 10/04/2019 CLINICAL DATA:  Hypoxia EXAM: PORTABLE CHEST 1 VIEW COMPARISON:  10/04/2019 FINDINGS: Interstitial/patchy opacities in the bilateral upper and lower lobes, unchanged. No definite pleural effusions. No pneumothorax. The heart is top-normal in size. IMPRESSION: Multifocal pneumonia in this patient with known COVID, unchanged. Electronically Signed   By: Julian Hy M.D.   On: 10/04/2019 10:07   Dg Chest Portable 1 View  Result Date: 09/25/2019 CLINICAL  DATA:  Hypoxia, shortness of breath. EXAM: PORTABLE CHEST 1 VIEW COMPARISON:  October 02, 2019. FINDINGS: The heart size and mediastinal contours are within normal limits. No pneumothorax or pleural effusion is noted. Stable left perihilar and lingular opacity is noted as well as mild right upper lobe opacity consistent with multifocal pneumonia. The visualized skeletal structures are unremarkable. IMPRESSION: Stable bilateral lung opacities are noted, left greater than right, consistent with multifocal pneumonia. Electronically Signed   By: Marijo Conception M.D.   On: 10/09/2019 14:14   Dg Chest Port 1 View  Result Date: 09/20/2019 CLINICAL DATA:  Pt complains of chest pain x 3 days, non-radiating, worse with inspiration, cough, fever, and generally feeling unwell. Pt was unable to hold deep inspiration for x-ray due to coughing. EXAM: PORTABLE CHEST 1 VIEW COMPARISON:  01/07/2014 FINDINGS: Hazy interstitial and airspace opacities present in the left perihilar region, right upper lobe, and  left lower lobe. Low lung volumes are present, causing crowding of the pulmonary vasculature. No blunting of the costophrenic angles. Cardiac and mediastinal margins appear normal. IMPRESSION: 1. Hazy interstitial and airspace opacities in the left perihilar region, right upper lobe, and left lower lobe, suspicious for multilobar pneumonia or atypical pneumonia. 2. Low lung volumes. Electronically Signed   By: Van Clines M.D.   On: 10/12/2019 18:57   Leg [place]  Result Date: 10/10/2019  Lower Venous Study Indications: Covid positive, elevated D-Dimer.  Comparison Study: Prior negative Bilateral LEV done 10/04/19 Performing Technologist: Sharion Dove RVS  Examination Guidelines: A complete evaluation includes B-mode imaging, spectral Doppler, color Doppler, and power Doppler as needed of all accessible portions of each vessel. Bilateral testing is considered an integral part of a complete examination. Limited examinations for reoccurring indications may be performed as noted.  +---------+---------------+---------+-----------+----------+--------------+  RIGHT     Compressibility Phasicity Spontaneity Properties Thrombus Aging  +---------+---------------+---------+-----------+----------+--------------+  CFV       Full            Yes       Yes                                    +---------+---------------+---------+-----------+----------+--------------+  SFJ       Full                                                             +---------+---------------+---------+-----------+----------+--------------+  FV Prox   Full                                                             +---------+---------------+---------+-----------+----------+--------------+  FV Mid    Full                                                             +---------+---------------+---------+-----------+----------+--------------+  FV Distal Full                                                              +---------+---------------+---------+-----------+----------+--------------+  PFV       Full                                                             +---------+---------------+---------+-----------+----------+--------------+  POP       Full            Yes       Yes                                    +---------+---------------+---------+-----------+----------+--------------+  PTV       Full                                                             +---------+---------------+---------+-----------+----------+--------------+  PERO      Full                                                             +---------+---------------+---------+-----------+----------+--------------+  Gastroc   None                                             Acute           +---------+---------------+---------+-----------+----------+--------------+   +---------+---------------+---------+-----------+----------+--------------+  LEFT      Compressibility Phasicity Spontaneity Properties Thrombus Aging  +---------+---------------+---------+-----------+----------+--------------+  CFV       Full            Yes       Yes                                    +---------+---------------+---------+-----------+----------+--------------+  SFJ       Full                                                             +---------+---------------+---------+-----------+----------+--------------+  FV Prox   Full                                                             +---------+---------------+---------+-----------+----------+--------------+  FV Mid    Full                                                             +---------+---------------+---------+-----------+----------+--------------+  FV Distal Full                                                             +---------+---------------+---------+-----------+----------+--------------+  PFV       Full                                                              +---------+---------------+---------+-----------+----------+--------------+  POP       Full            Yes       Yes                                    +---------+---------------+---------+-----------+----------+--------------+  PTV       Full                                                             +---------+---------------+---------+-----------+----------+--------------+  PERO      Full                                                             +---------+---------------+---------+-----------+----------+--------------+  Gastroc   None                                             Acute           +---------+---------------+---------+-----------+----------+--------------+     Summary: Right: Findings consistent with acute deep vein thrombosis involving the right gastrocnemius veins. Left: Findings consistent with acute deep vein thrombosis involving the left gastrocnemius veins.  *See table(s) above for measurements and observations. Electronically signed by Curt Jews MD on 10/10/2019 at 1:39:57 PM.    Final    Le Venous  Result Date: 10/04/2019  Lower Venous Study Indications: Covid-19 positive, and Swelling.  Anticoagulation: Lovenox. Comparison Study: No priors Performing Technologist: Velva Harman Sturdivant RDMS, RVT  Examination Guidelines: A complete evaluation includes B-mode imaging, spectral Doppler, color Doppler, and power Doppler as needed of all accessible portions of each vessel. Bilateral testing is considered an integral part of a complete examination. Limited examinations for reoccurring indications may be performed as noted.  +---------+---------------+---------+-----------+----------+--------------+  RIGHT     Compressibility Phasicity Spontaneity Properties Thrombus Aging  +---------+---------------+---------+-----------+----------+--------------+  CFV       Full            Yes       Yes                                     +---------+---------------+---------+-----------+----------+--------------+  SFJ       Full                                                             +---------+---------------+---------+-----------+----------+--------------+  FV Prox   Full                                                             +---------+---------------+---------+-----------+----------+--------------+  FV Mid    Full                                                             +---------+---------------+---------+-----------+----------+--------------+  FV Distal Full                                                             +---------+---------------+---------+-----------+----------+--------------+  PFV       Full                                                             +---------+---------------+---------+-----------+----------+--------------+  POP       Full            No        Yes                                    +---------+---------------+---------+-----------+----------+--------------+  PTV       Full                                                             +---------+---------------+---------+-----------+----------+--------------+  PERO      Full                                                             +---------+---------------+---------+-----------+----------+--------------+   +---------+---------------+---------+-----------+----------+--------------+  LEFT      Compressibility Phasicity Spontaneity Properties Thrombus Aging  +---------+---------------+---------+-----------+----------+--------------+  CFV       Full            Yes       Yes                                    +---------+---------------+---------+-----------+----------+--------------+  SFJ       Full                                                             +---------+---------------+---------+-----------+----------+--------------+  FV Prox   Full                                                              +---------+---------------+---------+-----------+----------+--------------+  FV Mid    Full                                                             +---------+---------------+---------+-----------+----------+--------------+  FV Distal Full                                                             +---------+---------------+---------+-----------+----------+--------------+  PFV       Full                                                             +---------+---------------+---------+-----------+----------+--------------+  POP       Full            Yes       Yes                                    +---------+---------------+---------+-----------+----------+--------------+  PTV       Full                                                             +---------+---------------+---------+-----------+----------+--------------+  PERO      Full                                                             +---------+---------------+---------+-----------+----------+--------------+     Summary: Right: There is no evidence of deep vein thrombosis in the lower extremity. No cystic structure found in the popliteal fossa. Left: There is no evidence of deep vein thrombosis in the lower extremity. No cystic structure found in the popliteal fossa.  *See table(s) above for measurements and observations. Electronically signed  by Ruta Hinds MD on 10/04/2019 at 3:31:58 PM.    Final

## 2019-10-11 NOTE — Progress Notes (Signed)
Physical Therapy Treatment Patient Details Name: William [name] MRN: [id number] DOB: [birth year] Today's Date: 10/11/2019    History of Present Illness Patient is a 63 y.o. male with PMHx of HTN, dyslipidemia, DM-2, CAD-who presented with shortness of breath and cough-he was found to have acute hypoxic respiratory failure secondary to COVID-19 pneumonia.    PT Comments    Patient received in recliner on  15 L HFNC and intermittently placing NRB. SPO2 95% at rest on 15 L. Patient ambulated on 15 L HFNC with SPO2 dropping to 69%. Patient would place NRB  And SPO2 slowly returned to > 94% within 2 minutes. Patient ambulated 2 more times, 68' each with desatting into high 68' to low 70's. Each time recovered with addition on NRB. Patient  Noted to be dyspneic but did not appear  Struggling, especially when he stopped for a break. Clearly, patient's respiratory status is worse than on PT eval on 11/19 when pt ambualted x 300' in room on 6-8 l HFNC. Continue m=ambulation with SPO2 allowed in low 80's per Dr. Candiss Norse.    Follow Up Recommendations  Home health PT     Equipment Recommendations  None recommended by PT    Recommendations for Other Services       Precautions / Restrictions Precautions Precaution Comments: monitor SPO2, try weaning. RN recommended that pt not ambuulate due to SPO2 dropping into 60's when walking, and using NRB and  HFNC at 15. will contact MD for guidance -Per Dr. Candiss Norse, continue  Ambulation and allow low 80's SPO2   Mobility  Bed Mobility               General bed mobility comments: pt in recliner  Transfers Overall transfer level: Needs assistance Equipment used: None Transfers: Sit to/from Stand Sit to Stand: Supervision         General transfer comment: for lines and safety, no LOB noted  Ambulation/Gait Ambulation/Gait assistance: Min guard;Min assist Gait Distance (Feet): 50 Feet(x 3) Assistive device: None   Gait velocity: decr    General Gait Details: patient ambulating with eyes closed so he is slightly off balance. encouraged  eyes open.  Patient did increase cadence with increased dyspnea 3rd walk. patient on 15 L HFNC   Stairs             Wheelchair Mobility    Modified Rankin (Stroke Patients Only)       Balance Overall balance assessment: Mild deficits observed, not formally tested                                          Cognition Arousal/Alertness: Awake/alert Behavior During Therapy: WFL for tasks assessed/performed                                          Exercises      General Comments        Pertinent Vitals/Pain Pain Assessment: No/denies pain    Home Living                      Prior Function            PT Goals (current goals can now be found in the care plan section) Progress towards PT goals: Progressing toward goals  Frequency    Min 3X/week      PT Plan Current plan remains appropriate;Discharge plan needs to be updated    Co-evaluation              AM-PAC PT "6 Clicks" Mobility   Outcome Measure  Help needed turning from your back to your side while in a flat bed without using bedrails?: None Help needed moving from lying on your back to sitting on the side of a flat bed without using bedrails?: None Help needed moving to and from a bed to a chair (including a wheelchair)?: A Little Help needed standing up from a chair using your arms (e.g., wheelchair or bedside chair)?: A Little Help needed to walk in hospital room?: A Little Help needed climbing 3-5 steps with a railing? : A Lot 6 Click Score: 19    End of Session Equipment Utilized During Treatment: Oxygen Activity Tolerance: Treatment limited secondary to medical complications (Comment) Patient left: in chair;with nursing/sitter in room Nurse Communication: Mobility status PT Visit Diagnosis: Unsteadiness on feet (R26.81)     Time:  1210-1250 PT Time Calculation (min) (ACUTE ONLY): 40 min  Charges:  $Gait Training: 23-37 mins $Self Care/Home Management: [place] Pager [phone] Office [phone]    Claretha Cooper 10/11/2019, 4:55 PM

## 2019-10-11 NOTE — Progress Notes (Signed)
[facility] for Lovenox Indication: VTE treatment  Allergies  Allergen Reactions  . Shellfish Allergy Hives    Patient Measurements: Height: 5\' 7"  (170.2 cm) Weight: 225 lb (102.1 kg) IBW/kg (Calculated) : 66.1 Heparin Dosing Weight:   Vital Signs: Temp: 97.9 F (36.6 C) (11/24 0515) Temp Source: Oral (11/24 0515) BP: 106/75 (11/24 0515) Pulse Rate: 66 (11/24 0515)  Labs: Recent Labs    10/09/19 0054 10/10/19 0255 10/11/19 0256  HGB 12.7* 12.6* 11.9*  HCT 39.6 40.0 37.4*  PLT 477* 431* 441*  CREATININE 1.32* 1.56* 1.60*    Estimated Creatinine Clearance: 54.5 mL/min (A) (by C-G formula based on SCr of 1.6 mg/dL (H)).  Medications:  Scheduled:  . aspirin EC  81 mg Oral Daily  . atorvastatin  40 mg Oral q1800  . Chlorhexidine Gluconate Cloth  6 each Topical Daily  . dextrose  25 mL Intravenous Once  . enoxaparin (LOVENOX) injection  100 mg Subcutaneous Q12H  . famotidine  20 mg Oral Daily  . insulin aspart  0-15 Units Subcutaneous TID WC  . insulin aspart  0-5 Units Subcutaneous QHS  . insulin detemir  10 Units Subcutaneous BID  . Ipratropium-Albuterol  2 puff Inhalation Q6H  . methylPREDNISolone (SOLU-MEDROL) injection  60 mg Intravenous Q12H  . metoprolol tartrate  12.5 mg Oral BID  . multivitamin with minerals  1 tablet Oral Daily  . ticagrelor  90 mg Oral BID  . vitamin B-12  100 mcg Oral Daily  . vitamin C  500 mg Oral Daily  . zinc sulfate  220 mg Oral Daily   Infusions:    Assessment: 8 yoM admitted on 11/16 with COVID-19 pneumonia.  He has been on Lovenox for VTE prophylaxis (BID dosing while in ICU from 11/17-11/21). 11/17 Dopplers negative for DVT Pharmacy is now consulted to dose Lovenox treatment dose for VTE.  Scr and CBC stable   Goal of Therapy:  Anti-Xa level 0.6-1 units/ml 4hrs after LMWH dose given Monitor platelets by anticoagulation protocol: Yes   Plan:  Lovenox 1 mg/kg (100mg ) [place]  q12h Follow up renal function, CBC, diagnostics.  Thank you Anette Guarneri, PharmD 10/11/2019 8:21 AM

## 2019-10-11 NOTE — Progress Notes (Signed)
RN updated patient's wife Sybil via phone. All questions and concerns answered at this time.

## 2019-10-11 NOTE — Progress Notes (Signed)
Pt wife called for an update. Answered all questions. No further questions at this time.

## 2019-10-11 NOTE — Progress Notes (Signed)
Pt wife updated. All questions answered. No further questions at this time.

## 2019-10-12 DIAGNOSIS — E875 Hyperkalemia: Secondary | ICD-10-CM | POA: Diagnosis present

## 2019-10-12 DIAGNOSIS — N1831 Chronic kidney disease, stage 3a: Secondary | ICD-10-CM | POA: Diagnosis present

## 2019-10-12 DIAGNOSIS — E1165 Type 2 diabetes mellitus with hyperglycemia: Secondary | ICD-10-CM | POA: Diagnosis present

## 2019-10-12 DIAGNOSIS — IMO0002 Reserved for concepts with insufficient information to code with codable children: Secondary | ICD-10-CM | POA: Diagnosis present

## 2019-10-12 DIAGNOSIS — I5032 Chronic diastolic (congestive) heart failure: Secondary | ICD-10-CM | POA: Diagnosis present

## 2019-10-12 LAB — CBC WITH DIFFERENTIAL/PLATELET
Abs Immature Granulocytes: 0.08 10*3/uL — ABNORMAL HIGH (ref 0.00–0.07)
Basophils Absolute: 0 10*3/uL (ref 0.0–0.1)
Basophils Relative: 0 %
Eosinophils Absolute: 0 10*3/uL (ref 0.0–0.5)
Eosinophils Relative: 0 %
HCT: 37.5 % — ABNORMAL LOW (ref 39.0–52.0)
Hemoglobin: 12.1 g/dL — ABNORMAL LOW (ref 13.0–17.0)
Immature Granulocytes: 1 %
Lymphocytes Relative: 2 %
Lymphs Abs: 0.2 10*3/uL — ABNORMAL LOW (ref 0.7–4.0)
MCH: 25.1 pg — ABNORMAL LOW (ref 26.0–34.0)
MCHC: 32.3 g/dL (ref 30.0–36.0)
MCV: 77.8 fL — ABNORMAL LOW (ref 80.0–100.0)
Monocytes Absolute: 0.2 10*3/uL (ref 0.1–1.0)
Monocytes Relative: 2 %
Neutro Abs: 12.3 10*3/uL — ABNORMAL HIGH (ref 1.7–7.7)
Neutrophils Relative %: 95 %
Platelets: 444 10*3/uL — ABNORMAL HIGH (ref 150–400)
RBC: 4.82 MIL/uL (ref 4.22–5.81)
RDW: 14.6 % (ref 11.5–15.5)
WBC: 12.9 10*3/uL — ABNORMAL HIGH (ref 4.0–10.5)
nRBC: 0.3 % — ABNORMAL HIGH (ref 0.0–0.2)

## 2019-10-12 LAB — C-REACTIVE PROTEIN: CRP: 1.6 mg/dL — ABNORMAL HIGH (ref <1.0)

## 2019-10-12 LAB — URINALYSIS, ROUTINE W REFLEX MICROSCOPIC
Bacteria, UA: NONE SEEN
Bilirubin Urine: NEGATIVE
Glucose, UA: >=500 mg/dL — AB
Hgb urine dipstick: NEGATIVE
Ketones, ur: NEGATIVE mg/dL
Leukocytes,Ua: NEGATIVE
Nitrite: NEGATIVE
Protein, ur: NEGATIVE mg/dL
Specific Gravity, Urine: 1.013 (ref 1.005–1.030)
pH: 5 (ref 5.0–8.0)

## 2019-10-12 LAB — COMPREHENSIVE METABOLIC PANEL WITH GFR
ALT: 42 U/L (ref 0–44)
AST: 33 U/L (ref 15–41)
Albumin: 2.9 g/dL — ABNORMAL LOW (ref 3.5–5.0)
Alkaline Phosphatase: 187 U/L — ABNORMAL HIGH (ref 38–126)
Anion gap: 10 (ref 5–15)
BUN: 47 mg/dL — ABNORMAL HIGH (ref 8–23)
CO2: 22 mmol/L (ref 22–32)
Calcium: 8.7 mg/dL — ABNORMAL LOW (ref 8.9–10.3)
Chloride: 105 mmol/L (ref 98–111)
Creatinine, Ser: 1.45 mg/dL — ABNORMAL HIGH (ref 0.61–1.24)
GFR calc Af Amer: 59 mL/min — ABNORMAL LOW
GFR calc non Af Amer: 51 mL/min — ABNORMAL LOW
Glucose, Bld: 345 mg/dL — ABNORMAL HIGH (ref 70–99)
Potassium: 5.5 mmol/L — ABNORMAL HIGH (ref 3.5–5.1)
Sodium: 137 mmol/L (ref 135–145)
Total Bilirubin: 0.3 mg/dL (ref 0.3–1.2)
Total Protein: 7 g/dL (ref 6.5–8.1)

## 2019-10-12 LAB — GLUCOSE, CAPILLARY
Glucose-Capillary: 294 mg/dL — ABNORMAL HIGH (ref 70–99)
Glucose-Capillary: 240 mg/dL — ABNORMAL HIGH (ref 70–99)
Glucose-Capillary: 223 mg/dL — ABNORMAL HIGH (ref 70–99)
Glucose-Capillary: 171 mg/dL — ABNORMAL HIGH (ref 70–99)
Glucose-Capillary: 266 mg/dL — ABNORMAL HIGH (ref 70–99)

## 2019-10-12 LAB — SODIUM, URINE, RANDOM: Sodium, Ur: <10 mmol/L

## 2019-10-12 LAB — CREATININE, URINE, RANDOM: Creatinine, Urine: 50.51 mg/dL

## 2019-10-12 LAB — OSMOLALITY, URINE: Osmolality, Ur: 419 mosm/kg (ref 300–900)

## 2019-10-12 LAB — MAGNESIUM: Magnesium: 2.7 mg/dL — ABNORMAL HIGH (ref 1.7–2.4)

## 2019-10-12 LAB — BRAIN NATRIURETIC PEPTIDE: B Natriuretic Peptide: 120.6 pg/mL — ABNORMAL HIGH (ref 0.0–100.0)

## 2019-10-12 LAB — D-DIMER, QUANTITATIVE: D-Dimer, Quant: 10.47 ug/mL-FEU — ABNORMAL HIGH (ref 0.00–0.50)

## 2019-10-12 MED ORDER — INSULIN ASPART 100 UNIT/ML ~~LOC~~ SOLN
0.0000 [IU] | SUBCUTANEOUS | Status: DC
  Administered 2019-10-12: 3 [IU] via SUBCUTANEOUS
  Administered 2019-10-12: 11 [IU] via SUBCUTANEOUS
  Administered 2019-10-13: 04:00:00 3 [IU] via SUBCUTANEOUS
  Administered 2019-10-13: 09:00:00 11 [IU] via SUBCUTANEOUS
  Administered 2019-10-13 (×2): 4 [IU] via SUBCUTANEOUS
  Administered 2019-10-14: 3 [IU] via SUBCUTANEOUS
  Administered 2019-10-14: 4 [IU] via SUBCUTANEOUS
  Administered 2019-10-14: 7 [IU] via SUBCUTANEOUS
  Administered 2019-10-14 – 2019-10-15 (×2): 3 [IU] via SUBCUTANEOUS
  Administered 2019-10-15: 4 [IU] via SUBCUTANEOUS
  Administered 2019-10-15: 15 [IU] via SUBCUTANEOUS

## 2019-10-12 MED ORDER — SODIUM ZIRCONIUM CYCLOSILICATE 10 G PO PACK
10.0000 g | PACK | Freq: Two times a day (BID) | ORAL | Status: AC
  Administered 2019-10-12 (×2): 10 g via ORAL
  Filled 2019-10-12 (×2): qty 1

## 2019-10-12 NOTE — Progress Notes (Signed)
PROGRESS NOTE    William [name]  [id number] DOB: [birth year] DOA: 10/08/2019 PCP: [name], MD   Brief Narrative:  63 y.o. BM PMHx of HTN, dyslipidemia, DM-2, CAD-who presented with shortness of breath and cough-he was found to have acute hypoxic respiratory failure secondary to COVID-19 pneumonia.    Subjective: Afebrile last 24 hours, A/O x4, negative CP, negative N/V, negative abdominal pain, positive S OB.  States daughter and wife in the process of being tested for Covid.   Assessment & Plan:   Active Problems:   HTN (hypertension)   Pneumonia due to COVID-19 virus   Diabetes mellitus type 2, uncontrolled, with complications ([place])   Chronic diastolic CHF (congestive heart failure) (HCC)   Hyperkalemia   Acute renal failure superimposed on stage 3a chronic kidney disease (HCC)   Covid pneumonia/acute respiratory failure with hypoxia COVID-19 Labs  Recent Labs    10/10/19 0255 10/11/19 0256 10/12/19 0420  DDIMER >20.00* >20.00* 10.47*  CRP 3.2* 4.3* 1.6*    Lab Results  Component Value Date   SARSCOV2NAA POSITIVE (A) 09/29/2019   -Remdesivir 11/15>>> 11/19 -Solu-Medrol 60 mg BID -Actemra 11/17 x 1 dose -Covid Convalescent Plasma 11/17 x 1 dose -Titrate O2 to maintain SPO2> 88% -Combivent -Flutter valve -Incentive spirometry  Hyperkalemia -11/25 Lokelma 10 g x 2  Chronic diastolic CHF -Strict in and out -Daily weight -Lasix IV PRN -11/26 PCXR pending -Metoprolol 12.5 mg BID -Brilinta 90 mg BID -NTG PRN  Essential HTN -See CHF  CAD -See CHF  Acute kidney injury  HLD -Statin  Remote Hx DVT -Significantly elevated D-dimer, negative CT and lower extremity venous Doppler. -Continue full dose Lovenox until D-dimer has trended down  Acute on CKD stage III -Patient's last recorded creatinine 01/10/2014= 0.86, most likely patient's creatinine has been worsening over the years unknown to patient.  Unknown baseline creatinine. -Monitor  closely -Hold all nephrotoxic agents Recent Labs  Lab 10/08/19 0140 10/09/19 0054 10/10/19 0255 10/11/19 0256 10/12/19 0420  CREATININE 1.52* 1.32* 1.56* 1.60* 1.45*   Obesity -BMI 35  Diabetes type 2 uncontrolled with complication -[id number] hemoglobin A1c= 8.1 -11/25 consult diabetic coordinator -11/25 consult diabetic nutrition -Levemir 15 units BID -NovoLog 3 units qac -11/25 increase resistant SSI      DVT prophylaxis: Lovenox full dose Code Status: Full Family Communication:  Disposition Plan: TBD   Consultants:  PCCM    Procedures/Significant Events:  11/16>> admit to [facility] 11/17>> transferred to ICU for worsening hypoxemia requiring heated high flow 11/17 Actemra  x 1 dose 11/17 Covid Convalescent Plasma  x 1 dose 11/17 Echocardiogram;Left Ventricle: EF= 60 to 65%.- moderately LVH -Grade I diastolic dysfunction (impaired relaxation).      I have personally reviewed and interpreted all radiology studies and my findings are as above.  VENTILATOR SETTINGS: HFNC+NRB Flow; 15 L/min FiO2; SPO2; 88%   Cultures   Antimicrobials: Anti-infectives (From admission, onward)   Start     Stop   10/04/19 1600  remdesivir 100 mg in sodium chloride 0.9 % 250 mL IVPB     10/06/19 1140   09/28/2019 2200  remdesivir 100 mg in sodium chloride 0.9 % 250 mL IVPB  Status:  Discontinued     10/08/2019 2358       Devices    LINES / TUBES:      Continuous Infusions:   Objective: Vitals:   10/12/19 1121 10/12/19 1200 10/12/19 1545 10/12/19 1600  BP: (!) 149/79 (!) 167/67  (!) 145/62  Pulse: 73  66 82 61  Resp: (!) 22 20 (!) 22 20  Temp: 98.3 F (36.8 C) 98.1 F (36.7 C)  98 F (36.7 C)  TempSrc: Oral Oral  Oral  SpO2: 96% (!) 87% (!) 86% 100%  Weight:      Height:        Intake/Output Summary (Last 24 hours) at 10/12/2019 1716 Last data filed at 10/12/2019 1400 Gross per 24 hour  Intake 860 ml  Output 2900 ml  Net -2040 ml   Filed Weights    10/06/2019 1347  Weight: 102.1 kg    Examination:  General: A/O x4, positive acute respiratory distress Eyes: negative scleral hemorrhage, negative anisocoria, negative icterus ENT: Negative Runny nose, negative gingival bleeding, Neck:  Negative scars, masses, torticollis, lymphadenopathy, JVD Lungs: Tachypnea, clear to auscultation bilaterally without wheezes or crackles Cardiovascular: Regular rate and rhythm without murmur gallop or rub normal S1 and S2 Abdomen: negative abdominal pain, nondistended, positive soft, bowel sounds, no rebound, no ascites, no appreciable mass Extremities: No significant cyanosis, clubbing, or edema bilateral lower extremities Skin: Negative rashes, lesions, ulcers Psychiatric:  Negative depression, negative anxiety, negative fatigue, negative mania  Central nervous system:  Cranial nerves II through XII intact, tongue/uvula midline, all extremities muscle strength 5/5, sensation intact throughout, negative dysarthria, negative expressive aphasia, negative receptive aphasia.  .     Data Reviewed: Care during the described time interval was provided by me .  I have reviewed this patient's available data, including medical history, events of note, physical examination, and all test results as part of my evaluation.   CBC: Recent Labs  Lab 10/07/19 0327 10/08/19 0140 10/09/19 0054 10/10/19 0255 10/11/19 0256 10/12/19 0420  WBC 10.4 11.0* 9.3 8.9 11.0* 12.9*  NEUTROABS 9.1* 10.0*  --  8.0* 10.5* 12.3*  HGB 12.8* 12.6* 12.7* 12.6* 11.9* 12.1*  HCT 39.8 39.2 39.6 40.0 37.4* 37.5*  MCV 78.0* 77.8* 78.1* 78.7* 77.9* 77.8*  PLT 463* 456* 477* 431* 441* [id number]*   Basic Metabolic Panel: Recent Labs  Lab 10/07/19 0327 10/08/19 0140 10/09/19 0054 10/10/19 0255 10/11/19 0256 10/12/19 0420  NA 142 137 145 142 136 137  K 4.2 4.6 4.2 4.4 5.0 5.5*  CL 106 102 110 108 104 105  CO2 23 23 24 25 22 22   GLUCOSE 86 312* 102* 40* 264* 345*  BUN 51* 53* 42* 43*  51* 47*  CREATININE 1.42* 1.52* 1.32* 1.56* 1.60* 1.45*  CALCIUM 8.6* 8.5* 9.1 8.7* 8.5* 8.7*  MG 2.9* 2.8*  --  2.6* 2.7* 2.7*   GFR: Estimated Creatinine Clearance: 60.1 mL/min (A) (by C-G formula based on SCr of 1.45 mg/dL (H)). Liver Function Tests: Recent Labs  Lab 10/08/19 0140 10/09/19 0054 10/10/19 0255 10/11/19 0256 10/12/19 0420  AST 29 46* 64* 49* 33  ALT 30 38 48* 47* 42  ALKPHOS 99 114 136* 156* 187*  BILITOT 0.9 0.4 0.6 0.8 0.3  PROT 6.9 7.1 6.9 6.9 7.0  ALBUMIN 3.2* 3.0* 3.0* 3.0* 2.9*   No results for input(s): LIPASE, AMYLASE in the last 168 hours. No results for input(s): AMMONIA in the last 168 hours. Coagulation Profile: No results for input(s): INR, PROTIME in the last 168 hours. Cardiac Enzymes: No results for input(s): CKTOTAL, CKMB, CKMBINDEX, TROPONINI in the last 168 hours. BNP (last 3 results) No results for input(s): PROBNP in the last 8760 hours. HbA1C: No results for input(s): HGBA1C in the last 72 hours. CBG: Recent Labs  Lab 10/11/19 1609 10/11/19 2005 10/12/19  [id number] 10/12/19 1149 10/12/19 1652  GLUCAP 221* 202* 294* 240* 223*   Lipid Profile: No results for input(s): CHOL, HDL, LDLCALC, TRIG, CHOLHDL, LDLDIRECT in the last 72 hours. Thyroid Function Tests: No results for input(s): TSH, T4TOTAL, FREET4, T3FREE, THYROIDAB in the last 72 hours. Anemia Panel: No results for input(s): VITAMINB12, FOLATE, FERRITIN, TIBC, IRON, RETICCTPCT in the last 72 hours. Urine analysis:    Component Value Date/Time   COLORURINE STRAW (A) 10/12/2019 0051   APPEARANCEUR CLEAR 10/12/2019 0051   LABSPEC 1.013 10/12/2019 0051   PHURINE 5.0 10/12/2019 0051   GLUCOSEU >=500 (A) 10/12/2019 0051   HGBUR NEGATIVE 10/12/2019 0051   BILIRUBINUR NEGATIVE 10/12/2019 0051   KETONESUR NEGATIVE 10/12/2019 0051   PROTEINUR NEGATIVE 10/12/2019 0051   NITRITE NEGATIVE 10/12/2019 0051   LEUKOCYTESUR NEGATIVE 10/12/2019 0051   Sepsis Labs:  @[email or web address] (procalcitonin:4,lacticidven:4)  ) Recent Results (from the past 240 hour(s))  SARS CORONAVIRUS 2 (TAT 6-24 HRS) Nasopharyngeal Nasopharyngeal Swab     Status: Abnormal   Collection Time: 09/29/2019  6:09 PM   Specimen: Nasopharyngeal Swab  Result Value Ref Range Status   SARS Coronavirus 2 POSITIVE (A) NEGATIVE Final    Comment: RESULT CALLED TO, READ BACK BY AND VERIFIED WITH: Herby Abraham, RN AT 2324 ON 10/17/2019 BY SAINVILUS S (NOTE) SARS-CoV-2 target nucleic acids are DETECTED. The SARS-CoV-2 RNA is generally detectable in upper and lower respiratory specimens during the acute phase of infection. Positive results are indicative of active infection with SARS-CoV-2. Clinical  correlation with patient history and other diagnostic information is necessary to determine patient infection status. Positive results do  not rule out bacterial infection or co-infection with other viruses. The expected result is Negative. Fact Sheet for Patients: SugarRoll.be Fact Sheet for Healthcare Providers: https://www.Danamarie Minami-mathews.com/ This test is not yet approved or cleared by the [place] FDA and  has been authorized for detection and/or diagnosis of SARS-CoV-2 by FDA under an Emergency Use Authorization (EUA). This EUA will remain  in effect (meaning this test can  be used) for the duration of the COVID-19 declaration under Section 564(b)(1) of the Act, 21 U.S.C. section 360bbb-3(b)(1), unless the authorization is terminated or revoked sooner. Performed at [facility], [place] [address], [place], [place] [address]   Blood culture (routine x 2)     Status: None   Collection Time: 09/21/2019  1:40 PM   Specimen: BLOOD  Result Value Ref Range Status   Specimen Description   Final    BLOOD RIGHT ANTECUBITAL Performed at [facility], [place] [address] [place], [place] [address]    Special Requests   Final    BOTTLES DRAWN AEROBIC AND  ANAEROBIC Blood Culture adequate volume Performed at [facility] [address] [place], [place] [address]    Culture   Final    NO GROWTH 5 DAYS Performed at [facility], [place] [address] [place], [place] [address]    Report Status 10/08/2019 FINAL  Final  Blood culture (routine x 2)     Status: None   Collection Time: 10/09/2019  1:45 PM   Specimen: BLOOD RIGHT WRIST  Result Value Ref Range Status   Specimen Description   Final    BLOOD RIGHT WRIST Performed at [facility] [address], [place], [place] [address]    Special Requests   Final    BOTTLES DRAWN AEROBIC AND ANAEROBIC Blood Culture adequate volume Performed at [facility], [address] [place], [place] [address]    Culture   Final    NO GROWTH 5 DAYS Performed at [facility], [place] [address], [place], [place] [address]    Report Status 10/08/2019 FINAL  Final  MRSA PCR Screening     Status: None   Collection Time: 10/05/19  6:39 PM   Specimen: Nasopharyngeal  Result Value Ref Range Status   MRSA by PCR NEGATIVE NEGATIVE Final    Comment:        The GeneXpert MRSA Assay (FDA approved for NASAL specimens only), is one component of a comprehensive MRSA colonization surveillance program. It is not intended to diagnose MRSA infection nor to guide or monitor treatment for MRSA infections. Performed at [facility], [place] [address], [place], [place] [address]          Radiology Studies: No results found.      Scheduled Meds: . aspirin EC  81 mg Oral Daily  . atorvastatin  40 mg Oral q1800  . Chlorhexidine Gluconate Cloth  6 each Topical Daily  . dextrose  25 mL Intravenous Once  . enoxaparin (LOVENOX) injection  100 mg Subcutaneous Q12H  . famotidine  20 mg Oral Daily  . insulin aspart  0-20 Units Subcutaneous Q4H  . insulin aspart  3 Units Subcutaneous TID WC  . insulin detemir  15 Units Subcutaneous  BID  . Ipratropium-Albuterol  2 puff Inhalation Q6H  . methylPREDNISolone (SOLU-MEDROL) injection  60 mg Intravenous Q12H  . metoprolol tartrate  12.5 mg Oral BID  . multivitamin with minerals  1 tablet Oral Daily  . sodium zirconium cyclosilicate  10 g Oral BID  . ticagrelor  90 mg Oral BID  . vitamin B-12  100 mcg Oral Daily  . vitamin C  500 mg Oral Daily  . zinc sulfate  220 mg Oral Daily   Continuous Infusions:   LOS: 9 days   The patient is critically ill with multiple organ systems failure and requires high complexity decision making for assessment and support, frequent evaluation and titration of therapies, application of advanced monitoring technologies and extensive interpretation of multiple databases. Critical Care Time devoted to patient care services described in this note  Time spent: 40 minutes     Rayne Cowdrey, Geraldo Docker, MD [facility] Pager [phone]  If 7PM-7AM, please contact night-coverage [email or web address] Password [facility] 10/12/2019, 5:16 PM

## 2019-10-12 NOTE — Progress Notes (Signed)
Has been up sitting in chair all day. He is on 15L HFNC and NRB to maintain sats 90% or greater. When attempted to remove NRB his O2 sats decrease to 85-86% after just one minute. Continuing to monitor.

## 2019-10-12 NOTE — Progress Notes (Signed)
Occupational Therapy Treatment Patient Details Name: William [name] MRN: [id number] DOB: Apr 10, 1956 Today's Date: 10/12/2019    History of present illness Patient is a 63 y.o. male with PMHx of HTN, dyslipidemia, DM-2, CAD-who presented with shortness of breath and cough-he was found to have acute hypoxic respiratory failure secondary to COVID-19 pneumonia.   OT comments  Pt making gradual progress towards OT goals, continues to have limitations due to decreased respiratory status, increased WOB with minimal exertion. Pt initially on 15L HFNC upon entry with O2 sats 80%, donned NRB with O2 increasing to >90%. Use of both 15L HFNC and NRB with mobility this session. Pt tolerating multiple sit<>stands (x4) and taking few steps/marching in place with minguard assist (x2 sets), lowest O2 noted 85% with max RR 44. With cues for deep breathing technique pt able to recover within 1-2 min. Pt remains highly motivated to return to PLOF. Have updated discharge recommendations as feel pt may require additional rehab services prior to return home pending his progress. Will continue to follow acutely.    Follow Up Recommendations  CIR;Supervision/Assistance - 24 hour    Equipment Recommendations  Other (comment);Tub/shower seat(TBD)    Recommendations for Other Services      Precautions / Restrictions Precautions Precaution Comments: monitor SPO2, try weaning. RN recommended that pt not ambuulate due to SPO2 dropping into 60's when walking, and using NRB and  HFNC at 15. will contact MD for guidance Restrictions Weight Bearing Restrictions: No       Mobility Bed Mobility               General bed mobility comments: pt in recliner  Transfers Overall transfer level: Needs assistance Equipment used: None Transfers: Sit to/from Stand Sit to Stand: Supervision         General transfer comment: for lines and safety, no LOB noted    Balance Overall balance assessment: Mild deficits  observed, not formally tested                                         ADL either performed or assessed with clinical judgement   ADL Overall ADL's : Needs assistance/impaired     Grooming: Set up;Sitting;Wash/dry face Grooming Details (indicate cue type and reason): seated in recliner                              Functional mobility during ADLs: Min guard;Supervision/safety       Vision       Perception     Praxis      Cognition Arousal/Alertness: Awake/alert Behavior During Therapy: WFL for tasks assessed/performed Overall Cognitive Status: Within Functional Limits for tasks assessed                                          Exercises Exercises: Other exercises General Exercises - Lower Extremity Long Arc Quad: AROM;Both;Seated;20 reps Hip Flexion/Marching: AROM;10 reps;Standing;Both;Seated Other Exercises Other Exercises: sit<>stand with mobility during session (x4), marching in place x10 steps, x2 reps   Shoulder Instructions       General Comments      Pertinent Vitals/ Pain       Pain Assessment: No/denies pain  Home Living  Prior Functioning/Environment              Frequency  Min 2X/week        Progress Toward Goals  OT Goals(current goals can now be found in the care plan section)  Progress towards OT goals: Progressing toward goals  Acute Rehab OT Goals Patient Stated Goal: to be able to walk in the park OT Goal Formulation: With patient Time For Goal Achievement: 10/24/19 Potential to Achieve Goals: Good ADL Goals Pt Will Perform Grooming: with modified independence;standing Pt Will Perform Lower Body Bathing: with modified independence;sit to/from stand Pt Will Perform Lower Body Dressing: with modified independence;sit to/from stand Pt Will Transfer to Toilet: with modified independence;ambulating Pt Will Perform Toileting -  Clothing Manipulation and hygiene: with modified independence;sit to/from stand Pt/caregiver will Perform Home Exercise Program: Increased strength;Both right and left upper extremity;With written HEP provided;With theraband Additional ADL Goal #1: Pt will tolerate >69min seated/standing activity at mod independent level with VSS.  Plan Frequency needs to be updated    Co-evaluation                 AM-PAC OT "6 Clicks" Daily Activity     Outcome Measure   Help from another person eating meals?: None Help from another person taking care of personal grooming?: None Help from another person toileting, which includes using toliet, bedpan, or urinal?: A Little Help from another person bathing (including washing, rinsing, drying)?: A Little Help from another person to put on and taking off regular upper body clothing?: A Little Help from another person to put on and taking off regular lower body clothing?: A Little 6 Click Score: 20    End of Session Equipment Utilized During Treatment: Oxygen  OT Visit Diagnosis: Muscle weakness (generalized) (M62.81);Other (comment)(decreased activity tolerance)   Activity Tolerance Patient tolerated treatment well   Patient Left in chair;with call bell/phone within reach   Nurse Communication Mobility status        Time: 0927-1009 OT Time Calculation (min): 42 min  Charges: OT General Charges $OT Visit: 1 Visit OT Treatments $Self Care/Home Management : 8-22 mins $Therapeutic Activity: 23-37 mins  William [name], OT [facility] Pager [phone] Office [phone]    [name] 10/12/2019, 10:18 AM

## 2019-10-12 NOTE — Progress Notes (Signed)
Returned call to pt wife. Pt wife called and stated she tried to call pt phone he answered but sounded exhausted and out of breathe. I explained to pt wife that we need to focus on the pt breathing and encourage her to call the nurses and we can communicate to pt. Pt wife verbalized understanding. She stated he does seem to get worked up when she puts her input in and he starts to get anxious. I communicated to wife we will keep her updated and she's more than welcome to call us anytime. All questions answered. No further questions at this time.

## 2019-10-13 ENCOUNTER — Inpatient Hospital Stay (HOSPITAL_COMMUNITY): Payer: Commercial Managed Care - PPO

## 2019-10-13 DIAGNOSIS — J9601 Acute respiratory failure with hypoxia: Secondary | ICD-10-CM | POA: Diagnosis present

## 2019-10-13 DIAGNOSIS — I10 Essential (primary) hypertension: Secondary | ICD-10-CM | POA: Diagnosis present

## 2019-10-13 LAB — CBC WITH DIFFERENTIAL/PLATELET
Abs Immature Granulocytes: 0.09 10*3/uL — ABNORMAL HIGH (ref 0.00–0.07)
Basophils Absolute: 0 10*3/uL (ref 0.0–0.1)
Basophils Relative: 0 %
Eosinophils Absolute: 0.1 10*3/uL (ref 0.0–0.5)
Eosinophils Relative: 0 %
HCT: 38.5 % — ABNORMAL LOW (ref 39.0–52.0)
Hemoglobin: 12.3 g/dL — ABNORMAL LOW (ref 13.0–17.0)
Immature Granulocytes: 1 %
Lymphocytes Relative: 2 %
Lymphs Abs: 0.3 10*3/uL — ABNORMAL LOW (ref 0.7–4.0)
MCH: 24.9 pg — ABNORMAL LOW (ref 26.0–34.0)
MCHC: 31.9 g/dL (ref 30.0–36.0)
MCV: 78.1 fL — ABNORMAL LOW (ref 80.0–100.0)
Monocytes Absolute: 0.2 10*3/uL (ref 0.1–1.0)
Monocytes Relative: 1 %
Neutro Abs: 13.6 10*3/uL — ABNORMAL HIGH (ref 1.7–7.7)
Neutrophils Relative %: 96 %
Platelets: 464 10*3/uL — ABNORMAL HIGH (ref 150–400)
RBC: 4.93 MIL/uL (ref 4.22–5.81)
RDW: 14.6 % (ref 11.5–15.5)
WBC: 14.2 10*3/uL — ABNORMAL HIGH (ref 4.0–10.5)
nRBC: 0.4 % — ABNORMAL HIGH (ref 0.0–0.2)

## 2019-10-13 LAB — GLUCOSE, CAPILLARY
Glucose-Capillary: 139 mg/dL — ABNORMAL HIGH (ref 70–99)
Glucose-Capillary: 269 mg/dL — ABNORMAL HIGH (ref 70–99)
Glucose-Capillary: 149 mg/dL — ABNORMAL HIGH (ref 70–99)
Glucose-Capillary: 181 mg/dL — ABNORMAL HIGH (ref 70–99)
Glucose-Capillary: 105 mg/dL — ABNORMAL HIGH (ref 70–99)

## 2019-10-13 LAB — COMPREHENSIVE METABOLIC PANEL
ALT: 43 U/L (ref 0–44)
AST: 40 U/L (ref 15–41)
Albumin: 2.8 g/dL — ABNORMAL LOW (ref 3.5–5.0)
Alkaline Phosphatase: 187 U/L — ABNORMAL HIGH (ref 38–126)
Anion gap: 9 (ref 5–15)
BUN: 45 mg/dL — ABNORMAL HIGH (ref 8–23)
CO2: 24 mmol/L (ref 22–32)
Calcium: 8.7 mg/dL — ABNORMAL LOW (ref 8.9–10.3)
Chloride: 108 mmol/L (ref 98–111)
Creatinine, Ser: 1.32 mg/dL — ABNORMAL HIGH (ref 0.61–1.24)
GFR calc Af Amer: >60 mL/min (ref >60)
GFR calc non Af Amer: 57 mL/min — ABNORMAL LOW (ref >60)
Glucose, Bld: 126 mg/dL — ABNORMAL HIGH (ref 70–99)
Potassium: 5 mmol/L (ref 3.5–5.1)
Sodium: 141 mmol/L (ref 135–145)
Total Bilirubin: 0.4 mg/dL (ref 0.3–1.2)
Total Protein: 6.9 g/dL (ref 6.5–8.1)

## 2019-10-13 LAB — LACTATE DEHYDROGENASE: LDH: 739 U/L — ABNORMAL HIGH (ref 98–192)

## 2019-10-13 LAB — MAGNESIUM: Magnesium: 2.7 mg/dL — ABNORMAL HIGH (ref 1.7–2.4)

## 2019-10-13 LAB — C-REACTIVE PROTEIN: CRP: <0.8 mg/dL (ref <1.0)

## 2019-10-13 LAB — D-DIMER, QUANTITATIVE: D-Dimer, Quant: 12.29 ug/mL-FEU — ABNORMAL HIGH (ref 0.00–0.50)

## 2019-10-13 LAB — PHOSPHORUS: Phosphorus: 3.7 mg/dL (ref 2.5–4.6)

## 2019-10-13 LAB — FERRITIN: Ferritin: 229 ng/mL (ref 24–336)

## 2019-10-13 MED ORDER — INSULIN ASPART 100 UNIT/ML ~~LOC~~ SOLN
8.0000 [IU] | Freq: Three times a day (TID) | SUBCUTANEOUS | Status: DC
  Administered 2019-10-13 – 2019-10-14 (×4): 8 [IU] via SUBCUTANEOUS

## 2019-10-13 NOTE — Plan of Care (Signed)
Pt slept on and off during the night. No complaints of pain verbalized. Alert and oriented. Vitals stable on 15L NRB and HFNC. Continues to be tachypneic and have dyspneic with minimal activity. Sats maintained >96% though unable to wean O2 - sats drop to low 80's as soon as NRB is removed for comfort breaks. Minimal assist with ADLs. IV x2 SL. Able to  regularly reposition for pressure relief but unable to tolerate prone positioning. Wife updated several times during the night. No other issues, will monitor.   Problem: Respiratory: Goal: Will maintain a patent airway Outcome: Progressing Goal: Complications related to the disease process, condition or treatment will be avoided or minimized Outcome: Progressing Problem: Coping: Goal: Level of anxiety will decrease Outcome: Progressing     Problem: Education: Goal: Knowledge of risk factors and measures for prevention of condition will improve Outcome: Progressing

## 2019-10-13 NOTE — Progress Notes (Addendum)
PROGRESS NOTE    William [name]  [id number] DOB: 06-11-1956 DOA: 10/02/2019 PCP: [name], MD   Brief Narrative:  63 y.o. BM PMHx of HTN, dyslipidemia, DM-2, CAD-who presented with shortness of breath and cough-he was found to have acute hypoxic respiratory failure secondary to COVID-19 pneumonia.    Subjective: 11/26 A/O x4, negative CP, negative N/V, negative abdominal pain.  Positive S OB   Assessment & Plan:   Active Problems:   Pneumonia due to COVID-19 virus   Diabetes mellitus type 2, uncontrolled, with complications ([place])   Chronic diastolic CHF (congestive heart failure) (HCC)   Hyperkalemia   Acute renal failure superimposed on stage 3a chronic kidney disease (HCC)   Acute respiratory failure with hypoxia (HCC)   Essential hypertension   Covid pneumonia/acute respiratory failure with hypoxia COVID-19 Labs  Recent Labs    10/11/19 0256 10/12/19 0420 10/13/19 0352  DDIMER >20.00* 10.47* 12.29*  FERRITIN  --   --  229  LDH  --   --  739*  CRP 4.3* 1.6* <0.8    Lab Results  Component Value Date   SARSCOV2NAA POSITIVE (A) 09/26/2019   -Remdesivir 11/15>>> 11/19 -Solu-Medrol 60 mg BID -Actemra 11/17 x 1 dose -Covid Convalescent Plasma 11/17 x 1 dose -Titrate O2 to maintain SPO2> 88% -Combivent -Flutter valve -Incentive spirometry -11/26 PCXR pending  Hyperkalemia -11/25 Lokelma 10 g x 2  Chronic diastolic CHF -Strict in and out -20.8 L -Daily weight Filed Weights   10/14/2019 1347  Weight: 102.1 kg  -Lasix IV PRN -11/26 PCXR pending -Metoprolol 12.5 mg BID -Brilinta 90 mg BID -NTG PRN  Essential HTN -See CHF  CAD -Continue dual antiplatelet therapy; Brilinta + ASA 81 mg daily -See CHF  HLD -Statin  Remote Hx DVT -Significantly elevated D-dimer, negative CT and lower extremity venous Doppler. -Continue full dose Lovenox until D-dimer has trended down  Acute on CKD stage III -Patient's last recorded creatinine 01/10/2014=  0.86, most likely patient's creatinine has been worsening over the years unknown to patient.  Unknown baseline creatinine. -Monitor closely -Hold all nephrotoxic agents Recent Labs  Lab 10/09/19 0054 10/10/19 0255 10/11/19 0256 10/12/19 0420 10/13/19 0352  CREATININE 1.32* 1.56* 1.60* 1.45* 1.32*   Obesity -BMI 35  Diabetes type 2 uncontrolled with complication -[id number] hemoglobin A1c= 8.1 -11/25 consult diabetic coordinator -11/25 consult diabetic nutrition -Levemir 15 units BID -11/25 increase resistant SSI  -11/26 increase NovoLog 8 units qac      DVT prophylaxis: Lovenox full dose Code Status: Full Family Communication: 11/26 Spoke with Ms Zapata (wife) explained plan of care and answered questions.    Disposition Plan: TBD   Consultants:  PCCM    Procedures/Significant Events:  11/16>> admit to [facility] 11/17>> transferred to ICU for worsening hypoxemia requiring heated high flow 11/17 Actemra  x 1 dose 11/17 Covid Convalescent Plasma  x 1 dose 11/17 Echocardiogram;Left Ventricle: EF= 60 to 65%.- moderately LVH -Grade I diastolic dysfunction (impaired relaxation).  11/22 CTA PE protocol; negative PE 11/26 PCXR; multifocal pneumonia and patient with known pneumonia    I have personally reviewed and interpreted all radiology studies and my findings are as above.  VENTILATOR SETTINGS: HFNC+NRB 11/26 Flow; 15 L/min FiO2; SPO2; 100%    Cultures   Antimicrobials: Anti-infectives (From admission, onward)   Start     Stop   10/04/19 1600  remdesivir 100 mg in sodium chloride 0.9 % 250 mL IVPB     10/06/19 1140   10/14/2019 2200  remdesivir 100 mg in sodium chloride 0.9 % 250 mL IVPB  Status:  Discontinued     09/30/2019 2358       Devices    LINES / TUBES:      Continuous Infusions:   Objective: Vitals:   10/12/19 2326 10/13/19 0340 10/13/19 0733 10/13/19 0913  BP: 136/68 (!) 133/99 (!) 168/75 (!) 168/75  Pulse: 68 (!) 56 64 64   Resp: (!) 23 (!) 21 20   Temp: 98 F (36.7 C) 98.7 F (37.1 C) 97.9 F (36.6 C)   TempSrc: Oral Oral Axillary   SpO2: 94% 99% 100%   Weight:      Height:        Intake/Output Summary (Last 24 hours) at 10/13/2019 1521 Last data filed at 10/13/2019 1300 Gross per 24 hour  Intake 1240 ml  Output 3125 ml  Net -1885 ml   Filed Weights   10/11/2019 1347  Weight: 102.1 kg   Physical Exam:  General: A/O 4, positive  acute respiratory distress Eyes: negative scleral hemorrhage, negative anisocoria, negative icterus ENT: Negative Runny nose, negative gingival bleeding, Neck:  Negative scars, masses, torticollis, lymphadenopathy, JVD Lungs: Tachypneic, positive bibasilar rhonchi RIGHT>> LEFT without wheezes or crackles Cardiovascular: Regular rate and rhythm without murmur gallop or rub normal S1 and S2 Abdomen: negative abdominal pain, nondistended, positive soft, bowel sounds, no rebound, no ascites, no appreciable mass Extremities: No significant cyanosis, clubbing, or edema bilateral lower extremities Skin: Negative rashes, lesions, ulcers Psychiatric:  Negative depression, negative anxiety, negative fatigue, negative mania  Central nervous system:  Cranial nerves II through XII intact, tongue/uvula midline, all extremities muscle strength 5/5, sensation intact throughout,negative dysarthria, negative expressive aphasia, negative receptive aphasia.  .     Data Reviewed: Care during the described time interval was provided by me .  I have reviewed this patient's available data, including medical history, events of note, physical examination, and all test results as part of my evaluation.   CBC: Recent Labs  Lab 10/08/19 0140 10/09/19 0054 10/10/19 0255 10/11/19 0256 10/12/19 0420 10/13/19 0352  WBC 11.0* 9.3 8.9 11.0* 12.9* 14.2*  NEUTROABS 10.0*  --  8.0* 10.5* 12.3* 13.6*  HGB 12.6* 12.7* 12.6* 11.9* 12.1* 12.3*  HCT 39.2 39.6 40.0 37.4* 37.5* 38.5*  MCV 77.8* 78.1*  78.7* 77.9* 77.8* 78.1*  PLT 456* 477* 431* 441* 444* [id number]*   Basic Metabolic Panel: Recent Labs  Lab 10/08/19 0140 10/09/19 0054 10/10/19 0255 10/11/19 0256 10/12/19 0420 10/13/19 0352  NA 137 145 142 136 137 141  K 4.6 4.2 4.4 5.0 5.5* 5.0  CL 102 110 108 104 105 108  CO2 23 24 25 22 22 24   GLUCOSE 312* 102* 40* 264* 345* 126*  BUN 53* 42* 43* 51* 47* 45*  CREATININE 1.52* 1.32* 1.56* 1.60* 1.45* 1.32*  CALCIUM 8.5* 9.1 8.7* 8.5* 8.7* 8.7*  MG 2.8*  --  2.6* 2.7* 2.7* 2.7*  PHOS  --   --   --   --   --  3.7   GFR: Estimated Creatinine Clearance: 66.1 mL/min (A) (by C-G formula based on SCr of 1.32 mg/dL (H)). Liver Function Tests: Recent Labs  Lab 10/09/19 0054 10/10/19 0255 10/11/19 0256 10/12/19 0420 10/13/19 0352  AST 46* 64* 49* 33 40  ALT 38 48* 47* 42 43  ALKPHOS 114 136* 156* 187* 187*  BILITOT 0.4 0.6 0.8 0.3 0.4  PROT 7.1 6.9 6.9 7.0 6.9  ALBUMIN 3.0* 3.0* 3.0* 2.9* 2.8*  No results for input(s): LIPASE, AMYLASE in the last 168 hours. No results for input(s): AMMONIA in the last 168 hours. Coagulation Profile: No results for input(s): INR, PROTIME in the last 168 hours. Cardiac Enzymes: No results for input(s): CKTOTAL, CKMB, CKMBINDEX, TROPONINI in the last 168 hours. BNP (last 3 results) No results for input(s): PROBNP in the last 8760 hours. HbA1C: No results for input(s): HGBA1C in the last 72 hours. CBG: Recent Labs  Lab 10/12/19 1943 10/12/19 2325 10/13/19 0337 10/13/19 0901 10/13/19 1112  GLUCAP 171* 266* 139* 269* 149*   Lipid Profile: No results for input(s): CHOL, HDL, LDLCALC, TRIG, CHOLHDL, LDLDIRECT in the last 72 hours. Thyroid Function Tests: No results for input(s): TSH, T4TOTAL, FREET4, T3FREE, THYROIDAB in the last 72 hours. Anemia Panel: Recent Labs    10/13/19 0352  FERRITIN 229   Urine analysis:    Component Value Date/Time   COLORURINE STRAW (A) 10/12/2019 0051   APPEARANCEUR CLEAR 10/12/2019 0051   LABSPEC  1.013 10/12/2019 0051   PHURINE 5.0 10/12/2019 0051   GLUCOSEU >=500 (A) 10/12/2019 0051   HGBUR NEGATIVE 10/12/2019 0051   BILIRUBINUR NEGATIVE 10/12/2019 0051   KETONESUR NEGATIVE 10/12/2019 0051   PROTEINUR NEGATIVE 10/12/2019 0051   NITRITE NEGATIVE 10/12/2019 0051   LEUKOCYTESUR NEGATIVE 10/12/2019 0051   Sepsis Labs: @LABRCNTIP (procalcitonin:4,lacticidven:4)  ) Recent Results (from the past 240 hour(s))  MRSA PCR Screening     Status: None   Collection Time: 10/05/19  6:39 PM   Specimen: Nasopharyngeal  Result Value Ref Range Status   MRSA by PCR NEGATIVE NEGATIVE Final    Comment:        The GeneXpert MRSA Assay (FDA approved for NASAL specimens only), is one component of a comprehensive MRSA colonization surveillance program. It is not intended to diagnose MRSA infection nor to guide or monitor treatment for MRSA infections. Performed at [facility], [place] [address] [place], [place] [address]          Radiology Studies: Dg Chest Port 1 View  Result Date: 10/13/2019 CLINICAL DATA:  Shortness of breath, COVID-19 EXAM: PORTABLE CHEST 1 VIEW COMPARISON:  CTA chest dated 10/09/2019 FINDINGS: Multifocal pneumonia with relative sparing of the left upper lobe. No definite pleural effusions. No pneumothorax. Cardiomegaly. IMPRESSION: Multifocal pneumonia in this patient with known COVID. Electronically Signed   By: Julian Hy M.D.   On: 10/13/2019 09:44        Scheduled Meds: . aspirin EC  81 mg Oral Daily  . atorvastatin  40 mg Oral q1800  . Chlorhexidine Gluconate Cloth  6 each Topical Daily  . dextrose  25 mL Intravenous Once  . enoxaparin (LOVENOX) injection  100 mg Subcutaneous Q12H  . famotidine  20 mg Oral Daily  . insulin aspart  0-20 Units Subcutaneous Q4H  . insulin aspart  8 Units Subcutaneous TID WC  . insulin detemir  15 Units Subcutaneous BID  . Ipratropium-Albuterol  2 puff Inhalation Q6H  . methylPREDNISolone  (SOLU-MEDROL) injection  60 mg Intravenous Q12H  . metoprolol tartrate  12.5 mg Oral BID  . multivitamin with minerals  1 tablet Oral Daily  . ticagrelor  90 mg Oral BID  . vitamin B-12  100 mcg Oral Daily  . vitamin C  500 mg Oral Daily  . zinc sulfate  220 mg Oral Daily   Continuous Infusions:   LOS: 10 days   The patient is critically ill with multiple organ systems failure and requires high complexity  decision making for assessment and support, frequent evaluation and titration of therapies, application of advanced monitoring technologies and extensive interpretation of multiple databases. Critical Care Time devoted to patient care services described in this note  Time spent: 40 minutes     WOODS, Geraldo Docker, MD [facility] Pager [phone]  If 7PM-7AM, please contact night-coverage [email or web address] Password [email or web address] 10/13/2019, 3:21 PM

## 2019-10-14 LAB — CBC WITH DIFFERENTIAL/PLATELET
Abs Immature Granulocytes: 0.11 10*3/uL — ABNORMAL HIGH (ref 0.00–0.07)
Basophils Absolute: 0 10*3/uL (ref 0.0–0.1)
Basophils Relative: 0 %
Eosinophils Absolute: 0 10*3/uL (ref 0.0–0.5)
Eosinophils Relative: 0 %
HCT: 40.9 % (ref 39.0–52.0)
Hemoglobin: 13 g/dL (ref 13.0–17.0)
Immature Granulocytes: 1 %
Lymphocytes Relative: 2 %
Lymphs Abs: 0.4 10*3/uL — ABNORMAL LOW (ref 0.7–4.0)
MCH: 25 pg — ABNORMAL LOW (ref 26.0–34.0)
MCHC: 31.8 g/dL (ref 30.0–36.0)
MCV: 78.5 fL — ABNORMAL LOW (ref 80.0–100.0)
Monocytes Absolute: 0.2 10*3/uL (ref 0.1–1.0)
Monocytes Relative: 1 %
Neutro Abs: 15.3 10*3/uL — ABNORMAL HIGH (ref 1.7–7.7)
Neutrophils Relative %: 96 %
Platelets: 474 10*3/uL — ABNORMAL HIGH (ref 150–400)
RBC: 5.21 MIL/uL (ref 4.22–5.81)
RDW: 14.8 % (ref 11.5–15.5)
WBC: 15.9 10*3/uL — ABNORMAL HIGH (ref 4.0–10.5)
nRBC: 0.2 % (ref 0.0–0.2)

## 2019-10-14 LAB — GLUCOSE, CAPILLARY
Glucose-Capillary: 118 mg/dL — ABNORMAL HIGH (ref 70–99)
Glucose-Capillary: 131 mg/dL — ABNORMAL HIGH (ref 70–99)
Glucose-Capillary: 137 mg/dL — ABNORMAL HIGH (ref 70–99)
Glucose-Capillary: 159 mg/dL — ABNORMAL HIGH (ref 70–99)
Glucose-Capillary: 221 mg/dL — ABNORMAL HIGH (ref 70–99)
Glucose-Capillary: 64 mg/dL — ABNORMAL LOW (ref 70–99)
Glucose-Capillary: 93 mg/dL (ref 70–99)

## 2019-10-14 LAB — LACTATE DEHYDROGENASE: LDH: 749 U/L — ABNORMAL HIGH (ref 98–192)

## 2019-10-14 LAB — FERRITIN: Ferritin: 249 ng/mL (ref 24–336)

## 2019-10-14 LAB — COMPREHENSIVE METABOLIC PANEL
ALT: 48 U/L — ABNORMAL HIGH (ref 0–44)
AST: 44 U/L — ABNORMAL HIGH (ref 15–41)
Albumin: 2.8 g/dL — ABNORMAL LOW (ref 3.5–5.0)
Alkaline Phosphatase: 183 U/L — ABNORMAL HIGH (ref 38–126)
Anion gap: 10 (ref 5–15)
BUN: 33 mg/dL — ABNORMAL HIGH (ref 8–23)
CO2: 24 mmol/L (ref 22–32)
Calcium: 8.8 mg/dL — ABNORMAL LOW (ref 8.9–10.3)
Chloride: 108 mmol/L (ref 98–111)
Creatinine, Ser: 1.16 mg/dL (ref 0.61–1.24)
GFR calc Af Amer: >60 mL/min (ref >60)
GFR calc non Af Amer: >60 mL/min (ref >60)
Glucose, Bld: 77 mg/dL (ref 70–99)
Potassium: 5.1 mmol/L (ref 3.5–5.1)
Sodium: 142 mmol/L (ref 135–145)
Total Bilirubin: 0.5 mg/dL (ref 0.3–1.2)
Total Protein: 6.8 g/dL (ref 6.5–8.1)

## 2019-10-14 LAB — PHOSPHORUS: Phosphorus: 3.8 mg/dL (ref 2.5–4.6)

## 2019-10-14 LAB — C-REACTIVE PROTEIN: CRP: <0.8 mg/dL (ref <1.0)

## 2019-10-14 LAB — D-DIMER, QUANTITATIVE: D-Dimer, Quant: 11.59 ug/mL-FEU — ABNORMAL HIGH (ref 0.00–0.50)

## 2019-10-14 LAB — MAGNESIUM: Magnesium: 2.6 mg/dL — ABNORMAL HIGH (ref 1.7–2.4)

## 2019-10-14 MED ORDER — LORAZEPAM 2 MG/ML IJ SOLN
1.0000 mg | Freq: Three times a day (TID) | INTRAMUSCULAR | Status: DC | PRN
  Administered 2019-10-14 (×2): 1 mg via INTRAVENOUS
  Filled 2019-10-14 (×2): qty 1

## 2019-10-14 MED ORDER — MORPHINE SULFATE (PF) 2 MG/ML IV SOLN
1.0000 mg | INTRAVENOUS | Status: DC | PRN
  Administered 2019-10-14: 2 mg via INTRAVENOUS
  Administered 2019-10-14: 13:00:00 1 mg via INTRAVENOUS
  Filled 2019-10-14 (×2): qty 1

## 2019-10-14 MED ORDER — AMLODIPINE BESYLATE 5 MG PO TABS
5.0000 mg | ORAL_TABLET | Freq: Every day | ORAL | Status: DC
  Administered 2019-10-14 – 2019-10-15 (×2): 5 mg via ORAL
  Filled 2019-10-14 (×2): qty 1

## 2019-10-14 NOTE — Progress Notes (Signed)
Physical Therapy Treatment Patient Details Name: William [name] MRN: [id number] DOB: [birth year] Today's Date: 10/14/2019    History of Present Illness Patient is a 63 y.o. male with PMHx of HTN, dyslipidemia, DM-2, CAD-who presented with shortness of breath and cough-he was found to have acute hypoxic respiratory failure secondary to COVID-19 pneumonia.    PT Comments    On arrival, seated (tripod breathing) with both HFNC and PRB with RR 44, sats 82% and incr WOB. Session focused on educating on techniques to slow breathing, improve sats, and decr anxiety. Patient responded well with sats 86-90% and RR 29. Each time his sats up to 90%, noted patient feels better and removes his PRB. Instructed him to keep mask on and he stated he thought he was supposed to try to take it off and not use it. Dr. Sherral Hammers in during session and discussed patient's concerns re: anxiety and trouble sleeping. By end of session, pt more relaxed with sats 88%. Deferred activity as pt had finally started to look better.     Follow Up Recommendations  Home health PT(currently medically declined; will monitor)     Equipment Recommendations  None recommended by PT    Recommendations for Other Services       Precautions / Restrictions Precautions Precaution Comments: sats drop quickly Restrictions Weight Bearing Restrictions: No    Mobility  Bed Mobility               General bed mobility comments: pt in recliner  Transfers                 General transfer comment: session focused on breathing and relaxation to incr sats, decr RR  Ambulation/Gait                 Stairs             Wheelchair Mobility    Modified Rankin (Stroke Patients Only)       Balance                                            Cognition Arousal/Alertness: Awake/alert Behavior During Therapy: Anxious Overall Cognitive Status: Within Functional Limits for tasks assessed                                  General Comments: on arrival, seated with both HFNC and PRB with RR 44, sats 82% and incr WOB      Exercises Other Exercises Other Exercises: re-educated on pursed lip breathing; pt feels he cannot breath in through his nose due to congestion, therefore focused on pursed lip exhalation with pt responding well to decreasing his RR to mid-20s and sats up to 90%; however as soon as he begins to speak, sats begin falling    General Comments General comments (skin integrity, edema, etc.): Discussed attempting to prone in bed; pt reports it makes him feel "closed-in" and more anxious. He denies issues with anxiety prior to hospitalization. Describes inability to sleep and racing thoughts. Dr. Sherral Hammers in during session and updated on pt's issues. Dr Sherral Hammers offered to order anti-anxiety meds and pt agreed to try.       Pertinent Vitals/Pain Pain Assessment: No/denies pain    Home Living  Prior Function            PT Goals (current goals can now be found in the care plan section) Acute Rehab PT Goals Patient Stated Goal: to be able to walk in the park Time For Goal Achievement: 10/20/19 Potential to Achieve Goals: Good Progress towards PT goals: Not progressing toward goals - comment(respiratory status/anxiety)    Frequency    Min 3X/week      PT Plan Current plan remains appropriate    Co-evaluation              AM-PAC PT "6 Clicks" Mobility   Outcome Measure  Help needed turning from your back to your side while in a flat bed without using bedrails?: None Help needed moving from lying on your back to sitting on the side of a flat bed without using bedrails?: None Help needed moving to and from a bed to a chair (including a wheelchair)?: A Little Help needed standing up from a chair using your arms (e.g., wheelchair or bedside chair)?: A Little Help needed to walk in hospital room?: Total Help needed  climbing 3-5 steps with a railing? : Total 6 Click Score: 16    End of Session Equipment Utilized During Treatment: Oxygen Activity Tolerance: Treatment limited secondary to medical complications (Comment) Patient left: in chair;with call bell/phone within reach Nurse Communication: Other (comment)(pt's incr RR, decr sats and Dr. Sherral Hammers rec's) PT Visit Diagnosis: Unsteadiness on feet (R26.81)     Time: 1208-1229 PT Time Calculation (min) (ACUTE ONLY): 21 min  Charges:  $Self Care/Home Management: 8-22                      Barry Brunner, PT Pager [phone]    Rexanne Mano 10/14/2019, 1:07 PM

## 2019-10-14 NOTE — Progress Notes (Signed)
Wife called and updated her of patient condition. Wife very appreciative.

## 2019-10-14 NOTE — Progress Notes (Addendum)
PROGRESS NOTE    William [name]  [id number] DOB: 03/01/1956 DOA: 10/16/2019 PCP: [name], MD   Brief Narrative:  63 y.o. BM PMHx of HTN, dyslipidemia, DM-2, CAD-who presented with shortness of breath and cough-he was found to have acute hypoxic respiratory failure secondary to COVID-19 pneumonia.    Subjective: 11/27 last 24 hours afebrile.  Patient feeling short of breath, anxious, not sleeping well.  Negative chest pain, negative abdominal pain.      Assessment & Plan:   Active Problems:   Pneumonia due to COVID-19 virus   Diabetes mellitus type 2, uncontrolled, with complications ([place])   Chronic diastolic CHF (congestive heart failure) (HCC)   Hyperkalemia   Acute renal failure superimposed on stage 3a chronic kidney disease (HCC)   Acute respiratory failure with hypoxia (HCC)   Essential hypertension   Covid pneumonia/acute respiratory failure with hypoxia COVID-19 Labs  Recent Labs    10/12/19 0420 10/13/19 0352 10/14/19 0320  DDIMER 10.47* 12.29* 11.59*  FERRITIN  --  229 249  LDH  --  739* 749*  CRP 1.6* <0.8 <0.8    Lab Results  Component Value Date   SARSCOV2NAA POSITIVE (A) 10/01/2019   -Remdesivir 11/15>>> 11/19 -Solu-Medrol 60 mg BID -Actemra 11/17 x 1 dose -Covid Convalescent Plasma 11/17 x 1 dose -Titrate O2 to maintain SPO2> 88% -Combivent -Flutter valve -Incentive spirometry -11/26 PCXR bilateral pneumonia see results below -11/27 morphine PRN air hunger -Prone patient 16 hours/day if tolerated, otherwise prone 2 to 3 hours per shift -Morphine PRN air hunger -11/28 PCXR pending  Hyperkalemia -11/25 Lokelma 10 g x 2 -11/27 resolved  Chronic diastolic CHF -Strict in and out -23.0 L -Daily weight Filed Weights   10/02/2019 1347  Weight: 102.1 kg  -Lasix IV PRN -11/26 PCXR pending -Metoprolol 12.5 mg BID -Brilinta 90 mg BID -11/27 Amlodipine 5 mg daily -NTG PRN  Essential HTN -See CHF  CAD -Continue dual  antiplatelet therapy; Brilinta + ASA 81 mg daily -See CHF  HLD -Statin  Remote Hx DVT -Significantly elevated D-dimer, negative CT and lower extremity venous Doppler. -Continue full dose Lovenox until D-dimer has trended down  Acute on CKD stage III -Patient's last recorded creatinine 01/10/2014= 0.86, most likely patient's creatinine has been worsening over the years unknown to patient.  Unknown baseline creatinine. -Monitor closely -Hold all nephrotoxic agents Recent Labs  Lab 10/10/19 0255 10/11/19 0256 10/12/19 0420 10/13/19 0352 10/14/19 0320  CREATININE 1.56* 1.60* 1.45* 1.32* 1.16   Obesity -BMI 35  Diabetes type 2 uncontrolled with complication -[id number] hemoglobin A1c= 8.1 -11/25 consult diabetic coordinator -11/25 consult diabetic nutrition -Levemir 15 units BID -11/25 increase resistant SSI  -11/26 increase NovoLog 8 units qac  Anxiety -11/27 Ativan anxiety    DVT prophylaxis: Lovenox full dose Code Status: Full Family Communication: 11/27 Spoke with William [name] (wife) explained plan of care and answered questions.    Disposition Plan: TBD   Consultants:  PCCM    Procedures/Significant Events:  11/16>> admit to [facility] 11/17>> transferred to ICU for worsening hypoxemia requiring heated high flow 11/17 Actemra  x 1 dose 11/17 Covid Convalescent Plasma  x 1 dose 11/17 Echocardiogram;Left Ventricle: EF= 60 to 65%.- moderately LVH -Grade I diastolic dysfunction (impaired relaxation).  11/22 CTA PE protocol; negative PE 11/26 PCXR; multifocal pneumonia and patient with known pneumonia    I have personally reviewed and interpreted all radiology studies and my findings are as above.  VENTILATOR SETTINGS: HFNC+NRB 11/27 Flow; 15 L/min FiO2; SPO2;  91%    Cultures 11/15 SARS coronavirus positive    Antimicrobials: Anti-infectives (From admission, onward)   Start     Stop   10/04/19 1600  remdesivir 100 mg in sodium chloride 0.9 % 250 mL  IVPB     10/06/19 1140   10/16/2019 2200  remdesivir 100 mg in sodium chloride 0.9 % 250 mL IVPB  Status:  Discontinued     09/30/2019 2358       Devices    LINES / TUBES:      Continuous Infusions:   Objective: Vitals:   10/14/19 0028 10/14/19 0532 10/14/19 0542 10/14/19 0755  BP: (!) 157/71 (!) 169/74  (!) 147/91  Pulse: 62 (!) 58  68  Resp: 18 (!) 27 20 (!) 25  Temp: 98.6 F (37 C) 99.2 F (37.3 C)  98.6 F (37 C)  TempSrc: Oral Oral  Oral  SpO2: 96% 94% 95% 90%  Weight:      Height:        Intake/Output Summary (Last 24 hours) at 10/14/2019 1212 Last data filed at 10/14/2019 1015 Gross per 24 hour  Intake 1020 ml  Output 2550 ml  Net -1530 ml   Filed Weights   10/06/2019 1347  Weight: 102.1 kg   Physical Exam:  General: A/O x4, positive acute respiratory distress Eyes: negative scleral hemorrhage, negative anisocoria, negative icterus ENT: Negative Runny nose, negative gingival bleeding, Neck:  Negative scars, masses, torticollis, lymphadenopathy, JVD Lungs:, Tachypneic, positive bibasilar rhonchi without wheezes or crackles Cardiovascular: Tachycardic, without murmur gallop or rub normal S1 and S2 Abdomen: negative abdominal pain, nondistended, positive soft, bowel sounds, no rebound, no ascites, no appreciable mass Extremities: No significant cyanosis, clubbing, or edema bilateral lower extremities Skin: Negative rashes, lesions, ulcers Psychiatric:  Negative depression, positive anxiety, negative fatigue, negative mania  Central nervous system:  Cranial nerves II through XII intact, tongue/uvula midline, all extremities muscle strength 5/5, sensation intact throughout, negative dysarthria, negative expressive aphasia, negative receptive aphasia.   .     Data Reviewed: Care during the described time interval was provided by me .  I have reviewed this patient's available data, including medical history, events of note, physical examination, and all test  results as part of my evaluation.   CBC: Recent Labs  Lab 10/10/19 0255 10/11/19 0256 10/12/19 0420 10/13/19 0352 10/14/19 0320  WBC 8.9 11.0* 12.9* 14.2* 15.9*  NEUTROABS 8.0* 10.5* 12.3* 13.6* 15.3*  HGB 12.6* 11.9* 12.1* 12.3* 13.0  HCT 40.0 37.4* 37.5* 38.5* 40.9  MCV 78.7* 77.9* 77.8* 78.1* 78.5*  PLT 431* 441* 444* 464* 123XX123*   Basic Metabolic Panel: Recent Labs  Lab 10/10/19 0255 10/11/19 0256 10/12/19 0420 10/13/19 0352 10/14/19 0320  NA 142 136 137 141 142  K 4.4 5.0 5.5* 5.0 5.1  CL 108 104 105 108 108  CO2 25 22 22 24 24   GLUCOSE 40* 264* 345* 126* 77  BUN 43* 51* 47* 45* 33*  CREATININE 1.56* 1.60* 1.45* 1.32* 1.16  CALCIUM 8.7* 8.5* 8.7* 8.7* 8.8*  MG 2.6* 2.7* 2.7* 2.7* 2.6*  PHOS  --   --   --  3.7 3.8   GFR: Estimated Creatinine Clearance: 75.2 mL/min (by C-G formula based on SCr of 1.16 mg/dL). Liver Function Tests: Recent Labs  Lab 10/10/19 0255 10/11/19 0256 10/12/19 0420 10/13/19 0352 10/14/19 0320  AST 64* 49* 33 40 44*  ALT 48* 47* 42 43 48*  ALKPHOS 136* 156* 187* 187* 183*  BILITOT 0.6 0.8  0.3 0.4 0.5  PROT 6.9 6.9 7.0 6.9 6.8  ALBUMIN 3.0* 3.0* 2.9* 2.8* 2.8*   No results for input(s): LIPASE, AMYLASE in the last 168 hours. No results for input(s): AMMONIA in the last 168 hours. Coagulation Profile: No results for input(s): INR, PROTIME in the last 168 hours. Cardiac Enzymes: No results for input(s): CKTOTAL, CKMB, CKMBINDEX, TROPONINI in the last 168 hours. BNP (last 3 results) No results for input(s): PROBNP in the last 8760 hours. HbA1C: No results for input(s): HGBA1C in the last 72 hours. CBG: Recent Labs  Lab 10/13/19 1628 10/13/19 2006 10/14/19 0002 10/14/19 0441 10/14/19 0754  GLUCAP 181* 105* 137* 118* 131*   Lipid Profile: No results for input(s): CHOL, HDL, LDLCALC, TRIG, CHOLHDL, LDLDIRECT in the last 72 hours. Thyroid Function Tests: No results for input(s): TSH, T4TOTAL, FREET4, T3FREE, THYROIDAB in the  last 72 hours. Anemia Panel: Recent Labs    10/13/19 0352 10/14/19 0320  FERRITIN 229 249   Urine analysis:    Component Value Date/Time   COLORURINE STRAW (A) 10/12/2019 0051   APPEARANCEUR CLEAR 10/12/2019 0051   LABSPEC 1.013 10/12/2019 0051   PHURINE 5.0 10/12/2019 0051   GLUCOSEU >=500 (A) 10/12/2019 0051   HGBUR NEGATIVE 10/12/2019 0051   BILIRUBINUR NEGATIVE 10/12/2019 0051   KETONESUR NEGATIVE 10/12/2019 0051   PROTEINUR NEGATIVE 10/12/2019 0051   NITRITE NEGATIVE 10/12/2019 0051   LEUKOCYTESUR NEGATIVE 10/12/2019 0051   Sepsis Labs: @LABRCNTIP (procalcitonin:4,lacticidven:4)  ) Recent Results (from the past 240 hour(s))  MRSA PCR Screening     Status: None   Collection Time: 10/05/19  6:39 PM   Specimen: Nasopharyngeal  Result Value Ref Range Status   MRSA by PCR NEGATIVE NEGATIVE Final    Comment:        The GeneXpert MRSA Assay (FDA approved for NASAL specimens only), is one component of a comprehensive MRSA colonization surveillance program. It is not intended to diagnose MRSA infection nor to guide or monitor treatment for MRSA infections. Performed at [facility], [place] [address] [place], [place] [address]          Radiology Studies: Dg Chest Port 1 View  Result Date: 10/13/2019 CLINICAL DATA:  Shortness of breath, COVID-19 EXAM: PORTABLE CHEST 1 VIEW COMPARISON:  CTA chest dated 10/09/2019 FINDINGS: Multifocal pneumonia with relative sparing of the left upper lobe. No definite pleural effusions. No pneumothorax. Cardiomegaly. IMPRESSION: Multifocal pneumonia in this patient with known COVID. Electronically Signed   By: Julian Hy M.D.   On: 10/13/2019 09:44        Scheduled Meds: . aspirin EC  81 mg Oral Daily  . atorvastatin  40 mg Oral q1800  . Chlorhexidine Gluconate Cloth  6 each Topical Daily  . dextrose  25 mL Intravenous Once  . enoxaparin (LOVENOX) injection  100 mg Subcutaneous Q12H  . famotidine  20  mg Oral Daily  . insulin aspart  0-20 Units Subcutaneous Q4H  . insulin aspart  8 Units Subcutaneous TID WC  . insulin detemir  15 Units Subcutaneous BID  . Ipratropium-Albuterol  2 puff Inhalation Q6H  . methylPREDNISolone (SOLU-MEDROL) injection  60 mg Intravenous Q12H  . metoprolol tartrate  12.5 mg Oral BID  . multivitamin with minerals  1 tablet Oral Daily  . ticagrelor  90 mg Oral BID  . vitamin B-12  100 mcg Oral Daily  . vitamin C  500 mg Oral Daily  . zinc sulfate  220 mg Oral Daily   Continuous  Infusions:   LOS: 11 days   The patient is critically ill with multiple organ systems failure and requires high complexity decision making for assessment and support, frequent evaluation and titration of therapies, application of advanced monitoring technologies and extensive interpretation of multiple databases. Critical Care Time devoted to patient care services described in this note  Time spent: 40 minutes     Emit Kuenzel, Geraldo Docker, MD [facility] Pager [phone]  If 7PM-7AM, please contact night-coverage [email or web address] Password [email or web address] 10/14/2019, 12:12 PM

## 2019-10-14 NOTE — Progress Notes (Signed)
[place] for Lovenox Indication: VTE treatment  Allergies  Allergen Reactions  . Shellfish Allergy Hives    Patient Measurements: Height: 5\' 7"  (170.2 cm) Weight: 225 lb (102.1 kg) IBW/kg (Calculated) : 66.1 Heparin Dosing Weight:   Vital Signs: Temp: 99.2 F (37.3 C) (11/27 0532) Temp Source: Oral (11/27 0532) BP: 169/74 (11/27 0532) Pulse Rate: 58 (11/27 0532)  Labs: Recent Labs    10/12/19 0420 10/13/19 0352 10/14/19 0320  HGB 12.1* 12.3* 13.0  HCT 37.5* 38.5* 40.9  PLT 444* 464* 474*  CREATININE 1.45* 1.32* 1.16    Estimated Creatinine Clearance: 75.2 mL/min (by C-G formula based on SCr of 1.16 mg/dL).  Medications:  Scheduled:  . aspirin EC  81 mg Oral Daily  . atorvastatin  40 mg Oral q1800  . Chlorhexidine Gluconate Cloth  6 each Topical Daily  . dextrose  25 mL Intravenous Once  . enoxaparin (LOVENOX) injection  100 mg Subcutaneous Q12H  . famotidine  20 mg Oral Daily  . insulin aspart  0-20 Units Subcutaneous Q4H  . insulin aspart  8 Units Subcutaneous TID WC  . insulin detemir  15 Units Subcutaneous BID  . Ipratropium-Albuterol  2 puff Inhalation Q6H  . methylPREDNISolone (SOLU-MEDROL) injection  60 mg Intravenous Q12H  . metoprolol tartrate  12.5 mg Oral BID  . multivitamin with minerals  1 tablet Oral Daily  . ticagrelor  90 mg Oral BID  . vitamin B-12  100 mcg Oral Daily  . vitamin C  500 mg Oral Daily  . zinc sulfate  220 mg Oral Daily   Infusions:    Assessment: 17 yoM admitted on 11/16 with COVID-19 pneumonia.  He has been on Lovenox for VTE prophylaxis (BID dosing while in ICU from 11/17-11/21). 11/17 Dopplers negative for DVT 11/22 Dopoplers positive for DVT Pharmacy is now consulted to dose Lovenox treatment dose for VTE.  -Scr improved and CBC stable  -No bleeding reported  Goal of Therapy:  Anti-Xa level 0.6-1 units/ml 4hrs after LMWH dose given Monitor platelets by anticoagulation  protocol: Yes   Plan:  Continue Lovenox 1 mg/kg (100mg ) [place] q12h Follow up renal function, CBC, s/s bleeding  Ulice Dash, PharmD, BCPS Clinical Pharmacist  10/14/2019 7:24 AM

## 2019-10-14 NOTE — Discharge Instructions (Signed)
°Carbohydrate Counting For People With Diabetes ° °Why Is Carbohydrate Counting Important? °• Counting carbohydrate servings may help you control your blood glucose level so that you feel better.  °• The balance between the carbohydrates you eat and insulin determines what your blood glucose level will be after eating.  °• Carbohydrate counting can also help you plan your meals. °Which Foods Have Carbohydrates? °Foods with carbohydrates include: °• Breads, crackers, and cereals  °• Pasta, rice, and grains  °• Starchy vegetables, such as potatoes, corn, and peas  °• Beans and legumes  °• Milk, soy milk, and yogurt  °• Fruits and fruit juices  °• Sweets, such as cakes, cookies, ice cream, jam, and jelly °Carbohydrate Servings °In diabetes meal planning, 1 serving of a food with carbohydrate has about 15 grams of carbohydrate: °• Check serving sizes with measuring cups and spoons or a food scale.  °• Read the Nutrition Facts on food labels to find out how many grams of carbohydrate are in foods you eat. °The food lists in this handout show portions that have about 15 grams of carbohydrate. ° °Tips °Meal Planning Tips °• An Eating Plan tells you how many carbohydrate servings to eat at your meals and snacks. For many adults, eating 3 to 5 servings of carbohydrate foods at each meal and 1 or 2 carbohydrate servings for each snack works well.  °• In a healthy daily Eating Plan, most carbohydrates come from:  °• At least 6 servings of fruits and nonstarchy vegetables  °• At least 6 servings of grains, beans, and starchy vegetables, with at least 3 servings from whole grains  °• At least 2 servings of milk or milk products °• Check your blood glucose level regularly. It can tell you if you need to adjust when you eat carbohydrates.  °• Eating foods that have fiber, such as whole grains, and having very few salty foods is good for your health.  °• Eat 4 to 6 ounces of meat or other protein foods (such as soybean burgers)  each day. Choose low-fat sources of protein, such as lean beef, lean pork, chicken, fish, low-fat cheese, or vegetarian foods such as soy.  °• Eat some healthy fats, such as olive oil, canola oil, and nuts.  °• Eat very little saturated fats. These unhealthy fats are found in butter, cream, and high-fat meats, such as bacon and sausage.  °• Eat very little or no trans fats. These unhealthy fats are found in all foods that list “partially hydrogenated oil” as an ingredient.  °Label Reading Tips °The Nutrition Facts panel on a label lists the grams of total carbohydrate in 1 standard serving. The label's standard serving may be larger or smaller than 1 carbohydrate serving. °To figure out how many carbohydrate servings are in the food: °• First, look at the label's standard serving size.  °• Check the grams of total carbohydrate. This is the amount of carbohydrate in 1 standard serving.  °• Divide the grams of total carbohydrate by 15. This number equals the number of carbohydrate servings in 1 standard serving. Remember: 1 carbohydrate serving is 15 grams of carbohydrate.  °• Note: You may ignore the grams of sugars on the Nutrition Facts panel because they are included in the grams of total carbohydrate. ° °Foods Recommended °1 serving = about 15 grams of carbohydrate °Starches °• 1 slice bread (1 ounce)  °• 1 tortilla (6-inch size)  °• ¼ large bagel (1 ounce)  °• 2 taco shells (5-inch   size)  °• ½ hamburger or hot dog bun (¾ ounce)  °• ¾ cup ready-to-eat unsweetened cereal  °• ½ cup cooked cereal  °• 1 cup broth-based soup  °• 4 to 6 small crackers  °• 1/3 cup pasta or rice (cooked)  °• ½ cup beans, peas, corn, sweet potatoes, winter squash, or mashed or boiled potatoes (cooked)  °• ¼ large baked potato (3 ounces)  °• ¾ ounce pretzels, potato chips, or tortilla chips  °• 3 cups popcorn (popped) °Fruit °• 1 small fresh fruit (¾ to 1 cup)  °• ½ cup canned or frozen fruit  °• 2 tablespoons dried fruit (blueberries,  cherries, cranberries, mixed fruit, raisins)  °• 17 small grapes (3 ounces)  °• 1 cup melon or berries  °• ½ cup unsweetened fruit juice °Milk °• 1 cup fat-free or reduced-fat milk  °• 1 cup soy milk  °• 2/3 cup (6 ounces) nonfat yogurt sweetened with sugar-free sweetener °Sweets and Desserts °• 2-inch square cake (unfrosted)  °• 2 small cookies (2/3 ounce)  °• ½ cup ice cream or frozen yogurt  °• ¼ cup sherbet or sorbet  °• 1 tablespoon syrup, jam, jelly, table sugar, or honey  °• 2 tablespoons light syrup °Other Foods °• Count 1 cup raw vegetables or ½ cup cooked nonstarchy vegetables as zero (0) carbohydrate servings or “free” foods. If you eat 3 or more servings at one meal, count them as 1 carbohydrate serving.  °• Foods that have less than 20 calories in each serving also may be counted as zero carbohydrate servings or “free” foods.  °• Count 1 cup of casserole or other mixed foods as 2 carbohydrate servings. ° °Carbohydrate Counting for People with Diabetes Sample 1-Day Menu  °Breakfast 1 extra-small banana (1 carbohydrate serving)  °1 cup low-fat or fat-free milk (1 carbohydrate serving)  °1 slice whole wheat bread (1 carbohydrate serving)  °1 teaspoon margarine  °Lunch 2 ounces turkey slices  °2 slices whole wheat bread (2 carbohydrate servings)  °2 lettuce leaves  °4 celery sticks  °4 carrot sticks  °1 medium apple (1 carbohydrate serving)  °1 cup low-fat or fat-free milk (1 carbohydrate serving)  °Afternoon Snack 2 tablespoons raisins (1 carbohydrate serving)  °3/4 ounce unsalted mini pretzels (1 carbohydrate serving)  °Evening Meal 3 ounces lean roast beef  °1/2 large baked potato (2 carbohydrate servings)  °1 tablespoon reduced-fat sour cream  °1/2 cup green beans  °1 tablespoon light salad dressing  °1 whole wheat dinner roll (1 carbohydrate serving)  °1 teaspoon margarine  °1 cup melon balls (1 carbohydrate serving)  °Evening Snack 2 tablespoons unsalted nuts  ° °Carbohydrate Counting for People with  Diabetes Vegan Sample 1-Day Menu  °Breakfast 1 cup cooked oatmeal (2 carbohydrate servings)  °½ cup blueberries (1 carbohydrate serving)  °2 tablespoons flaxseeds  °1 cup soymilk fortified with calcium and vitamin D  °1 cup coffee  °Lunch 2 slices whole wheat bread (2 carbohydrate servings)  °½ cup baked tofu  °¼ cup lettuce  °2 slices tomato  °2 slices avocado  °½ cup baby carrots  °1 orange (1 carbohydrate serving)  °1 cup soymilk fortified with calcium and vitamin D   °Evening Meal Burrito made with: 1 6-inch corn tortilla (1 carbohydrate serving)  °1 cup refried vegetarian beans (1 carbohydrate serving)  °¼ cup chopped tomatoes  °¼ cup lettuce  °¼ cup salsa  °1/3 cup brown rice (1 carbohydrate serving)  °1 tablespoon olive oil for rice  °½   cup zucchini   °Evening Snack 6 small whole grain crackers (1 carbohydrate serving)  °2 apricots (½ carbohydrate serving)  °¼ cup unsalted peanuts (½ carbohydrate serving)   ° ° °Carbohydrate Counting for People with Diabetes Vegetarian (Lacto-Ovo) Sample 1-Day Menu  °Breakfast 1 cup cooked oatmeal (2 carbohydrate servings)  °½ cup blueberries (1 carbohydrate serving)  °2 tablespoons flaxseeds  °1 egg  °1 cup 1% milk (1 carbohydrate serving)  °1 cup coffee  °Lunch 2 slices whole wheat bread (2 carbohydrate servings)  °2 ounces low-fat cheese  °¼ cup lettuce  °2 slices tomato  °2 slices avocado  °½ cup baby carrots  °1 orange (1 carbohydrate serving)  °1 cup unsweetened tea  °Evening Meal Burrito made with: 1 6-inch corn tortilla (1 carbohydrate serving)  °½ cup refried vegetarian beans (1 carbohydrate serving)  °¼ cup tomatoes  °¼ cup lettuce  °¼ cup salsa  °1/3 cup brown rice (1 carbohydrate serving)  °1 tablespoon olive oil for rice  °½ cup zucchini  °1 cup 1% milk (1 carbohydrate serving)  °Evening Snack 6 small whole grain crackers (1 carbohydrate serving)  °2 apricots (½ carbohydrate serving)  °¼ cup unsalted peanuts (½ carbohydrate serving)   ° °Copyright 2020 © Academy  of [employer] and Dietetics. All rights reserved. ° °Using Nutrition Labels: Carbohydrate ° °• Serving Size  °• Look at the serving size. All the information on the label is based on this portion. °• Servings Per Container  °• The number of servings contained in the package. °• Guidelines for Carbohydrate  °• Look at the total grams of carbohydrate in the serving size.  °• 1 carbohydrate choice = 15 grams of carbohydrate. °Range of Carbohydrate Grams Per Choice  °Carbohydrate Grams/Choice Carbohydrate Choices  °6-10 ½  °11-20 1  °21-25 1½  °26-35 2  °36-40 2½  °41-50 3  °51-55 3½  °56-65 4  °66-70 4½  °71-80 5  ° ° °Copyright 2020 © Academy of [employer] and Dietetics. All rights reserved. ° °

## 2019-10-14 NOTE — Progress Notes (Signed)
Nutrition Consult for Diet Education  RD consulted for nutrition education regarding diabetes.   Lab Results  Component Value Date   HGBA1C 8.1 (H) 10/02/2019    Diet education is not appropriate during acute hospitalization for COVID-19. Patient is on 15 L HFNC currently. He is weak and not interested in discussing diet for diabetes at this time. Recommend outpatient diabetes diet education after discharge home.   RD added "Carbohydrate Counting for People with Diabetes" handout from the Academy of Nutrition and Dietetics to patient's D/C instructions.  Body mass index is 35.24 kg/m. Pt meets criteria for obesity, unspecified based on current BMI.  Current diet order is CHO modified, patient is consuming approximately 75-100% of meals at this time. Labs and medications reviewed. No further nutrition interventions warranted at this time. If additional nutrition issues arise, please re-consult RD.  Molli Barrows, RD, LDN, [place] Pager [phone] After Hours Pager [phone]

## 2019-10-15 ENCOUNTER — Inpatient Hospital Stay (HOSPITAL_COMMUNITY): Payer: Commercial Managed Care - PPO

## 2019-10-15 LAB — COMPREHENSIVE METABOLIC PANEL
ALT: 51 U/L — ABNORMAL HIGH (ref 0–44)
AST: 40 U/L (ref 15–41)
Albumin: 2.9 g/dL — ABNORMAL LOW (ref 3.5–5.0)
Alkaline Phosphatase: 153 U/L — ABNORMAL HIGH (ref 38–126)
Anion gap: 10 (ref 5–15)
BUN: 38 mg/dL — ABNORMAL HIGH (ref 8–23)
CO2: 25 mmol/L (ref 22–32)
Calcium: 8.7 mg/dL — ABNORMAL LOW (ref 8.9–10.3)
Chloride: 108 mmol/L (ref 98–111)
Creatinine, Ser: 1.31 mg/dL — ABNORMAL HIGH (ref 0.61–1.24)
GFR calc Af Amer: >60 mL/min (ref >60)
GFR calc non Af Amer: 58 mL/min — ABNORMAL LOW (ref >60)
Glucose, Bld: 62 mg/dL — ABNORMAL LOW (ref 70–99)
Potassium: 4.9 mmol/L (ref 3.5–5.1)
Sodium: 143 mmol/L (ref 135–145)
Total Bilirubin: 0.7 mg/dL (ref 0.3–1.2)
Total Protein: 6.8 g/dL (ref 6.5–8.1)

## 2019-10-15 LAB — CBC WITH DIFFERENTIAL/PLATELET
Abs Immature Granulocytes: 0.1 10*3/uL — ABNORMAL HIGH (ref 0.00–0.07)
Basophils Absolute: 0 10*3/uL (ref 0.0–0.1)
Basophils Relative: 0 %
Eosinophils Absolute: 0.1 10*3/uL (ref 0.0–0.5)
Eosinophils Relative: 0 %
HCT: 41.4 % (ref 39.0–52.0)
Hemoglobin: 13 g/dL (ref 13.0–17.0)
Immature Granulocytes: 1 %
Lymphocytes Relative: 2 %
Lymphs Abs: 0.3 10*3/uL — ABNORMAL LOW (ref 0.7–4.0)
MCH: 24.9 pg — ABNORMAL LOW (ref 26.0–34.0)
MCHC: 31.4 g/dL (ref 30.0–36.0)
MCV: 79.2 fL — ABNORMAL LOW (ref 80.0–100.0)
Monocytes Absolute: 0.2 10*3/uL (ref 0.1–1.0)
Monocytes Relative: 1 %
Neutro Abs: 16.9 10*3/uL — ABNORMAL HIGH (ref 1.7–7.7)
Neutrophils Relative %: 96 %
Platelets: 470 10*3/uL — ABNORMAL HIGH (ref 150–400)
RBC: 5.23 MIL/uL (ref 4.22–5.81)
RDW: 15 % (ref 11.5–15.5)
WBC: 17.6 10*3/uL — ABNORMAL HIGH (ref 4.0–10.5)
nRBC: 0 % (ref 0.0–0.2)

## 2019-10-15 LAB — BRAIN NATRIURETIC PEPTIDE: B Natriuretic Peptide: 99.5 pg/mL (ref 0.0–100.0)

## 2019-10-15 LAB — LACTATE DEHYDROGENASE: LDH: 659 U/L — ABNORMAL HIGH (ref 98–192)

## 2019-10-15 LAB — GLUCOSE, CAPILLARY
Glucose-Capillary: 78 mg/dL (ref 70–99)
Glucose-Capillary: 95 mg/dL (ref 70–99)
Glucose-Capillary: 87 mg/dL (ref 70–99)
Glucose-Capillary: 347 mg/dL — ABNORMAL HIGH (ref 70–99)
Glucose-Capillary: 133 mg/dL — ABNORMAL HIGH (ref 70–99)

## 2019-10-15 LAB — PROCALCITONIN: Procalcitonin: 0.19 ng/mL

## 2019-10-15 LAB — MAGNESIUM: Magnesium: 2.8 mg/dL — ABNORMAL HIGH (ref 1.7–2.4)

## 2019-10-15 LAB — C-REACTIVE PROTEIN: CRP: <0.8 mg/dL (ref <1.0)

## 2019-10-15 LAB — D-DIMER, QUANTITATIVE: D-Dimer, Quant: 8.84 ug/mL-FEU — ABNORMAL HIGH (ref 0.00–0.50)

## 2019-10-15 LAB — PHOSPHORUS: Phosphorus: 4.4 mg/dL (ref 2.5–4.6)

## 2019-10-15 LAB — FERRITIN: Ferritin: 259 ng/mL (ref 24–336)

## 2019-10-15 MED ORDER — AMLODIPINE BESYLATE 5 MG PO TABS
5.0000 mg | ORAL_TABLET | Freq: Once | ORAL | Status: AC
  Administered 2019-10-15: 5 mg via ORAL
  Filled 2019-10-15: qty 1

## 2019-10-15 MED ORDER — AMLODIPINE BESYLATE 5 MG PO TABS
10.0000 mg | ORAL_TABLET | Freq: Every day | ORAL | Status: DC
  Administered 2019-10-16 – 2019-10-20 (×5): 10 mg via ORAL
  Filled 2019-10-15 (×5): qty 1
  Filled 2019-10-15: qty 2

## 2019-10-15 MED ORDER — METHYLPREDNISOLONE SODIUM SUCC 40 MG IJ SOLR
40.0000 mg | Freq: Every day | INTRAMUSCULAR | Status: DC
  Administered 2019-10-15 – 2019-10-16 (×2): 40 mg via INTRAVENOUS
  Filled 2019-10-15 (×2): qty 1

## 2019-10-15 MED ORDER — CARVEDILOL 3.125 MG PO TABS
6.2500 mg | ORAL_TABLET | Freq: Two times a day (BID) | ORAL | Status: DC
  Administered 2019-10-15 (×2): 6.25 mg via ORAL
  Filled 2019-10-15 (×2): qty 2

## 2019-10-15 MED ORDER — INSULIN ASPART 100 UNIT/ML ~~LOC~~ SOLN: 3.0000 [IU] | Freq: Three times a day (TID) | SUBCUTANEOUS | Status: DC

## 2019-10-15 MED ORDER — INSULIN DETEMIR 100 UNIT/ML ~~LOC~~ SOLN
9.0000 [IU] | Freq: Two times a day (BID) | SUBCUTANEOUS | Status: DC
  Administered 2019-10-15 (×2): 9 [IU] via SUBCUTANEOUS
  Filled 2019-10-15 (×3): qty 0.09

## 2019-10-15 MED ORDER — ZOLPIDEM TARTRATE 5 MG PO TABS: 5.0000 mg | ORAL_TABLET | Freq: Every evening | ORAL | Status: DC | PRN

## 2019-10-15 MED ORDER — FUROSEMIDE 10 MG/ML IJ SOLN
60.0000 mg | Freq: Once | INTRAMUSCULAR | Status: AC
  Administered 2019-10-15: 60 mg via INTRAVENOUS
  Filled 2019-10-15: qty 6

## 2019-10-15 NOTE — Progress Notes (Signed)
PROGRESS NOTE                                                                                                                                                                                                             Patient Demographics:    William [name], is a 63 y.o. male, DOB - 12-18-1955, [id number]  Outpatient Primary MD for the patient is [name], MD   Admit date - 10/17/2019   LOS - 1  Chief Complaint  Patient presents with   COVID positive   Shortness of Breath       Brief Narrative: Patient is a 63 y.o. male with PMHx of HTN, dyslipidemia, DM-2, CAD-who presented with shortness of breath and cough-he was found to have acute hypoxic respiratory failure secondary to COVID-19 pneumonia.  Significant events: 11/16>> admit to [facility] 11/17>> transferred to ICU for worsening hypoxemia requiring heated high flow 11/20>> transferred back to PCU  COVID-19 Medications: Steroids:11/16>> Remdesivir:11/15>>11/19 Actemra:11/17>>x 1 Convalescent Plasma:11/17>>x1   Subjective:   Patient in bed denies any headache chest or abdominal pain.  Continues to have shortness of breath and now has developed some orthopnea.  States shortness of breath has gotten worse again in the last 1 to 2 days.   Assessment  & Plan :   Acute Hypoxic Resp Failure due to Covid 19 Viral pneumonia: has severe pulmonary disease, he has been treated so far with steroids, remdesivir, convalescent plasma along with Actemra, he was treated in ICU and stabilized and transferred to my care on 10/09/2019 on combination of high flow oxygen and nonrebreather mask.    He is still extremely hypoxic and still requiring both high flow oxygen and nonrebreather mask.  His inflammatory markers are rising again hence he was switched from Decadron to high-dose Solu-Medrol on the 23rd will start tapering dose on the 28th , he continues to remain very  tenuous he might require getting transferred back to ICU if he does not turn around.  Updated wife again on the 28th about his poor prognosis.  Case also discussed with pulmonary physician Dr. Lake Bells on 24th.  If he gets any worse he might require being transferred back to ICU.  Was encouraged to sit up in chair in the daytime use I-S and flutter valve for pulmonary toiletry and then prone at bed at night.  SpO2: 92 % O2 Flow Rate (L/min): 15 L/min FiO2 (%): 60 %   COVID-19 Labs: Recent Labs    10/13/19 0352 10/14/19 0320 10/15/19 0203  DDIMER 12.29* 11.59* 8.84*  FERRITIN 229 249 259  LDH 739* 749* 659*  CRP <0.8 <0.8 <0.8    Lab Results  Component Value Date   SARSCOV2NAA POSITIVE (A) 09/19/2019     AKI: Improved-suspect may have developed some mild CKD stage III , renal function seems to have stabilized with a creatinine of around 1.4.  Acute on Chronic diastolic heart failure EF 60% on recent echo: Appears to be in fluid overload IV Lasix repeated on 10/15/2019, added Coreg.  Will monitor.  HTN: Appears to be poorly controlled, placed on Coreg along with Norvasc.  Monitor and adjust.  Dyslipidemia: Continue statin  History of CAD: No anginal symptoms-continue dual antiplatelet agents, statin and metoprolol.  Remote history of DVT: ++ D dimer , negative CT and lower extremity venous duplex.  Improving with high-dose Lovenox continue.  Obesity: BMI 35.  Follow with PCP for weight loss.   Leukocytosis - CXR better, procalcitonin stable, likely steroid related, will monitor.  DM-2 (A1c 8.1 on 09/19/2019): Poor outpatient control due to hyperglycemia.  On Lantus sliding scale and premeal NovoLog.  Monitor and adjust.  Have increased Premeal NovoLog on the 24th.  CBG (last 3)  Recent Labs    10/15/19 0154 10/15/19 0508 10/15/19 0743  GLUCAP 78 95 87     GI prophylaxis: H2 Blocker  Consults  :  PCCM  Procedures  :    CTA and Leg [place] - No Clots  TTE -   1.  Left ventricular ejection fraction, by visual estimation, is 60 to 65%. The left ventricle has normal function. There is moderately increased left ventricular hypertrophy.  2. Left ventricular diastolic parameters are consistent with Grade I diastolic dysfunction (impaired relaxation).  3. Global right ventricle has normal systolic function.The right ventricular size is normal.  4. Right atrial size was normal.  5. The mitral valve is normal in structure. No evidence of mitral valve regurgitation. No evidence of mitral stenosis.  6. The tricuspid valve is normal in structure. Tricuspid valve regurgitation is trivial.  7. The aortic valve is normal in structure. Aortic valve regurgitation is not visualized. No evidence of aortic valve sclerosis or stenosis.  8. The pulmonic valve was normal in structure. Pulmonic valve regurgitation is not visualized.  9. Mildly elevated pulmonary artery systolic pressure. 10. The inferior vena cava is normal in size with greater than 50% respiratory variability, suggesting right atrial pressure of 3 mmHg. 11. Normal LV systolic function; moderate LVH; grade 1 diastolic dysfunction.   Condition - Extremely Guarded  Family Communication  : spouse on 11/21, 10/10/19, 11/28  Code Status :  Full Code  Diet :   Diet Order            Diet Carb Modified Fluid consistency: Thin; Room service appropriate? Yes  Diet effective now               Disposition Plan  :  Remain hospitalized-suspect can be transferred to PCU later today if he remains stable on regular high flow.  Barriers to discharge: Hypoxia requiring O2 supplementation/complete 5 days of IV Remdesivir  Antimicorbials  :    Anti-infectives (From admission, onward)   Start     Dose/Rate Route Frequency Ordered Stop   10/04/19 1600  remdesivir 100 mg in sodium chloride 0.9 % 250  mL IVPB     100 mg 500 mL/hr over 30 Minutes Intravenous Daily 10/10/2019 2358 10/06/19 1140   10/12/2019 2200   remdesivir 100 mg in sodium chloride 0.9 % 250 mL IVPB  Status:  Discontinued     100 mg 500 mL/hr over 30 Minutes Intravenous Every 24 hours 10/13/2019 1537 10/10/2019 2358     DVT Prophylaxis  :  Lovenox    Inpatient Medications  Scheduled Meds:  amLODipine  5 mg Oral Daily   aspirin EC  81 mg Oral Daily   atorvastatin  40 mg Oral q1800   Chlorhexidine Gluconate Cloth  6 each Topical Daily   dextrose  25 mL Intravenous Once   enoxaparin (LOVENOX) injection  100 mg Subcutaneous Q12H   famotidine  20 mg Oral Daily   insulin aspart  0-20 Units Subcutaneous Q4H   insulin aspart  3 Units Subcutaneous TID WC   insulin detemir  9 Units Subcutaneous BID   Ipratropium-Albuterol  2 puff Inhalation Q6H   methylPREDNISolone (SOLU-MEDROL) injection  40 mg Intravenous Daily   metoprolol tartrate  12.5 mg Oral BID   multivitamin with minerals  1 tablet Oral Daily   ticagrelor  90 mg Oral BID   vitamin B-12  100 mcg Oral Daily   vitamin C  500 mg Oral Daily   zinc sulfate  220 mg Oral Daily   Continuous Infusions:  PRN Meds:.acetaminophen, lip balm, LORazepam, morphine injection, nitroGLYCERIN, ondansetron **OR** ondansetron (ZOFRAN) IV, sodium chloride, tetrahydrozoline  Time Spent in minutes  25   See all Orders from today for further details  Lala Lund M.D on 10/15/2019 at 10:48 AM  To page go to [email or web address] - use universal password  [facility] -  Office  [phone]    Objective:   Vitals:   10/15/19 0500 10/15/19 0600 10/15/19 0731 10/15/19 0852  BP: (!) 147/78 (!) 145/77 (!) 177/82 (!) 177/82  Pulse: 77 65 73 91  Resp: (!) 26 (!) 22 (!) 22   Temp: 98.7 F (37.1 C) 98.5 F (36.9 C) 99.1 F (37.3 C)   TempSrc: Oral Oral Oral   SpO2: 94% 96% 92%   Weight: 91.5 kg     Height:        Wt Readings from Last 3 Encounters:  10/15/19 91.5 kg  09/24/2019 102.1 kg  03/30/14 97.5 kg     Intake/Output Summary (Last 24 hours) at 10/15/2019  1048 Last data filed at 10/15/2019 0830 Gross per 24 hour  Intake 490 ml  Output 2100 ml  Net -1610 ml     Physical Exam  Awake Alert,   No new F.N deficits, Normal affect [place].AT,PERRAL Supple Neck,No JVD, No cervical lymphadenopathy appriciated.  Symmetrical Chest wall movement, Good air movement bilaterally,+ve rales RRR,No Gallops, Rubs or new Murmurs, No Parasternal Heave +ve B.Sounds, Abd Soft, No tenderness, No organomegaly appriciated, No rebound - guarding or rigidity. No Cyanosis, Clubbing or edema, No new Rash or bruise   Data Review:    CBC  Recent Labs  Lab 10/11/19 0256 10/12/19 0420 10/13/19 0352 10/14/19 0320 10/15/19 0203  WBC 11.0* 12.9* 14.2* 15.9* 17.6*  HGB 11.9* 12.1* 12.3* 13.0 13.0  HCT 37.4* 37.5* 38.5* 40.9 41.4  PLT 441* 444* 464* 474* 470*  MCV 77.9* 77.8* 78.1* 78.5* 79.2*  MCH 24.8* 25.1* 24.9* 25.0* 24.9*  MCHC 31.8 32.3 31.9 31.8 31.4  RDW 14.4 14.6 14.6 14.8 15.0  LYMPHSABS 0.3* 0.2* 0.3* 0.4* 0.3*  MONOABS 0.1 0.2 0.2  0.2 0.2  EOSABS 0.0 0.0 0.1 0.0 0.1  BASOSABS 0.0 0.0 0.0 0.0 0.0    Chemistries   Recent Labs  Lab 10/11/19 0256 10/12/19 0420 10/13/19 0352 10/14/19 0320 10/15/19 0203  NA 136 137 141 142 143  K 5.0 5.5* 5.0 5.1 4.9  CL 104 105 108 108 108  CO2 22 22 24 24 25   GLUCOSE 264* 345* 126* 77 62*  BUN 51* 47* 45* 33* 38*  CREATININE 1.60* 1.45* 1.32* 1.16 1.31*  CALCIUM 8.5* 8.7* 8.7* 8.8* 8.7*  MG 2.7* 2.7* 2.7* 2.6* 2.8*  AST 49* 33 40 44* 40  ALT 47* 42 43 48* 51*  ALKPHOS 156* 187* 187* 183* 153*  BILITOT 0.8 0.3 0.4 0.5 0.7   ------------------------------------------------------------------------------------------------------------------ No results for input(s): CHOL, HDL, LDLCALC, TRIG, CHOLHDL, LDLDIRECT in the last 72 hours.  Lab Results  Component Value Date   HGBA1C 8.1 (H) 10/15/2019    ------------------------------------------------------------------------------------------------------------------ No results for input(s): TSH, T4TOTAL, T3FREE, THYROIDAB in the last 72 hours.  Invalid input(s): FREET3 ------------------------------------------------------------------------------------------------------------------  Micro Results Recent Results (from the past 240 hour(s))  MRSA PCR Screening     Status: None   Collection Time: 10/05/19  6:39 PM   Specimen: Nasopharyngeal  Result Value Ref Range Status   MRSA by PCR NEGATIVE NEGATIVE Final    Comment:        The GeneXpert MRSA Assay (FDA approved for NASAL specimens only), is one component of a comprehensive MRSA colonization surveillance program. It is not intended to diagnose MRSA infection nor to guide or monitor treatment for MRSA infections. Performed at [facility], [place] [address] [place], [place] [address]     Radiology Reports [place]  Result Date: 10/09/2019 CLINICAL DATA:  COVID pneumonia, shortness of breath, concern for PE EXAM: CT ANGIOGRAPHY CHEST WITH CONTRAST TECHNIQUE: Multidetector CT imaging of the chest was performed using the standard protocol during bolus administration of intravenous contrast. Multiplanar CT image reconstructions and MIPs were obtained to evaluate the vascular anatomy. CONTRAST:  18mL OMNIPAQUE IOHEXOL 350 MG/ML SOLN COMPARISON:  None. FINDINGS: Cardiovascular: No significant filling defect or pulmonary embolus demonstrated by CTA. Pulmonary arteries appear patent. Limited assessment of the lower lobes with motion artifact from breathing. Minor aortic atherosclerosis. Negative for aneurysm or dissection. Patent two-vessel arch anatomy. No mediastinal hemorrhage or hematoma. Native coronary atherosclerosis noted. Heart size without pericardial effusion Mediastinum/Nodes: Prominent thyroid noted extending substernal. Trachea and esophagus unremarkable.  Esophagus is nondilated. Mildly prominent prevascular, hilar and subcarinal lymph nodes, suspect reactive/inflammatory. No bulky adenopathy. Lungs/Pleura: Extensive diffuse and patchy ground-glass opacities throughout all lobes of both lungs with some sparing of the lung apices. There is associated bibasilar consolidation/atelectasis with central air bronchograms. Appearance compatible with atypical pneumonia/viral pneumonia as seen with COVID-19. No significant effusion. No other pleural abnormality or pneumothorax. Trachea and central airways remain patent. No mucous plugging or obstruction. Upper Abdomen: Fatty infiltration of the liver suspected. No acute finding in the upper abdomen. Musculoskeletal: No chest wall abnormality. No acute or significant osseous findings. Review of the MIP images confirms the above findings. IMPRESSION: 1. Negative for significant acute pulmonary embolus by CTA. 2. Extensive diffuse and patchy ground-glass opacities throughout all lobes of both lungs with some sparing of the lung apices. Findings compatible with atypical pneumonia/viral pneumonia as seen with COVID-19. Aortic Atherosclerosis (ICD10-I70.0). Electronically Signed   By: Jerilynn Mages.  Shick M.D.   On: 10/09/2019 09:23   Dg Chest Port 1 View  Result Date:  10/15/2019 CLINICAL DATA:  COVID-19 positive, shortness of breath. EXAM: PORTABLE CHEST 1 VIEW COMPARISON:  Chest x-rays dated 10/13/2019 and 10/04/2019. FINDINGS: Ground-glass opacities are again seen at the lung bases bilaterally, improved compared to the most recent chest x-ray of 10/13/2019, now similar to the earlier plain film of 10/04/2019. No pneumothorax seen. Heart size and mediastinal contours are stable. IMPRESSION: Bibasilar pneumonia, improved compared to most recent chest x-ray of 10/13/2019. Electronically Signed   By: Franki Cabot M.D.   On: 10/15/2019 08:20   Dg Chest Port 1 View  Result Date: 10/13/2019 CLINICAL DATA:  Shortness of breath, COVID-19  EXAM: PORTABLE CHEST 1 VIEW COMPARISON:  CTA chest dated 10/09/2019 FINDINGS: Multifocal pneumonia with relative sparing of the left upper lobe. No definite pleural effusions. No pneumothorax. Cardiomegaly. IMPRESSION: Multifocal pneumonia in this patient with known COVID. Electronically Signed   By: Julian Hy M.D.   On: 10/13/2019 09:44   Dg Chest Port 1 View  Result Date: 10/04/2019 CLINICAL DATA:  Hypoxia EXAM: PORTABLE CHEST 1 VIEW COMPARISON:  10/14/2019 FINDINGS: Interstitial/patchy opacities in the bilateral upper and lower lobes, unchanged. No definite pleural effusions. No pneumothorax. The heart is top-normal in size. IMPRESSION: Multifocal pneumonia in this patient with known COVID, unchanged. Electronically Signed   By: Julian Hy M.D.   On: 10/04/2019 10:07   Dg Chest Portable 1 View  Result Date: 09/18/2019 CLINICAL DATA:  Hypoxia, shortness of breath. EXAM: PORTABLE CHEST 1 VIEW COMPARISON:  October 02, 2019. FINDINGS: The heart size and mediastinal contours are within normal limits. No pneumothorax or pleural effusion is noted. Stable left perihilar and lingular opacity is noted as well as mild right upper lobe opacity consistent with multifocal pneumonia. The visualized skeletal structures are unremarkable. IMPRESSION: Stable bilateral lung opacities are noted, left greater than right, consistent with multifocal pneumonia. Electronically Signed   By: Marijo Conception M.D.   On: 10/11/2019 14:14   Dg Chest Port 1 View  Result Date: 10/16/2019 CLINICAL DATA:  Pt complains of chest pain x 3 days, non-radiating, worse with inspiration, cough, fever, and generally feeling unwell. Pt was unable to hold deep inspiration for x-ray due to coughing. EXAM: PORTABLE CHEST 1 VIEW COMPARISON:  01/07/2014 FINDINGS: Hazy interstitial and airspace opacities present in the left perihilar region, right upper lobe, and left lower lobe. Low lung volumes are present, causing crowding of the  pulmonary vasculature. No blunting of the costophrenic angles. Cardiac and mediastinal margins appear normal. IMPRESSION: 1. Hazy interstitial and airspace opacities in the left perihilar region, right upper lobe, and left lower lobe, suspicious for multilobar pneumonia or atypical pneumonia. 2. Low lung volumes. Electronically Signed   By: Van Clines M.D.   On: 10/13/2019 18:57   Leg [place]  Result Date: 10/10/2019  Lower Venous Study Indications: Covid positive, elevated D-Dimer.  Comparison Study: Prior negative Bilateral LEV done 10/04/19 Performing Technologist: Sharion Dove RVS  Examination Guidelines: A complete evaluation includes B-mode imaging, spectral Doppler, color Doppler, and power Doppler as needed of all accessible portions of each vessel. Bilateral testing is considered an integral part of a complete examination. Limited examinations for reoccurring indications may be performed as noted.  +---------+---------------+---------+-----------+----------+--------------+  RIGHT     Compressibility Phasicity Spontaneity Properties Thrombus Aging  +---------+---------------+---------+-----------+----------+--------------+  CFV       Full            Yes       Yes                                    +---------+---------------+---------+-----------+----------+--------------+  SFJ       Full                                                             +---------+---------------+---------+-----------+----------+--------------+  FV Prox   Full                                                             +---------+---------------+---------+-----------+----------+--------------+  FV Mid    Full                                                             +---------+---------------+---------+-----------+----------+--------------+  FV Distal Full                                                             +---------+---------------+---------+-----------+----------+--------------+  PFV       Full                                                              +---------+---------------+---------+-----------+----------+--------------+  POP       Full            Yes       Yes                                    +---------+---------------+---------+-----------+----------+--------------+  PTV       Full                                                             +---------+---------------+---------+-----------+----------+--------------+  PERO      Full                                                             +---------+---------------+---------+-----------+----------+--------------+  Gastroc   None                                             Acute           +---------+---------------+---------+-----------+----------+--------------+   +---------+---------------+---------+-----------+----------+--------------+  LEFT      Compressibility Phasicity Spontaneity Properties Thrombus Aging  +---------+---------------+---------+-----------+----------+--------------+  CFV       Full            Yes       Yes                                    +---------+---------------+---------+-----------+----------+--------------+  SFJ       Full                                                             +---------+---------------+---------+-----------+----------+--------------+  FV Prox   Full                                                             +---------+---------------+---------+-----------+----------+--------------+  FV Mid    Full                                                             +---------+---------------+---------+-----------+----------+--------------+  FV Distal Full                                                             +---------+---------------+---------+-----------+----------+--------------+  PFV       Full                                                             +---------+---------------+---------+-----------+----------+--------------+  POP       Full            Yes       Yes                                     +---------+---------------+---------+-----------+----------+--------------+  PTV       Full                                                             +---------+---------------+---------+-----------+----------+--------------+  PERO      Full                                                             +---------+---------------+---------+-----------+----------+--------------+  Gastroc   None                                             Acute           +---------+---------------+---------+-----------+----------+--------------+     Summary: Right: Findings consistent with acute deep vein thrombosis involving the right gastrocnemius veins. Left: Findings consistent with acute deep vein thrombosis involving the left gastrocnemius veins.  *See table(s) above for measurements and observations. Electronically signed by Curt Jews MD on 10/10/2019 at 1:39:57 PM.    Final    Le Venous  Result Date: 10/04/2019  Lower Venous Study Indications: Covid-19 positive, and Swelling.  Anticoagulation: Lovenox. Comparison Study: No priors Performing Technologist: Velva Harman Sturdivant RDMS, RVT  Examination Guidelines: A complete evaluation includes B-mode imaging, spectral Doppler, color Doppler, and power Doppler as needed of all accessible portions of each vessel. Bilateral testing is considered an integral part of a complete examination. Limited examinations for reoccurring indications may be performed as noted.  +---------+---------------+---------+-----------+----------+--------------+  RIGHT     Compressibility Phasicity Spontaneity Properties Thrombus Aging  +---------+---------------+---------+-----------+----------+--------------+  CFV       Full            Yes       Yes                                    +---------+---------------+---------+-----------+----------+--------------+  SFJ       Full                                                              +---------+---------------+---------+-----------+----------+--------------+  FV Prox   Full                                                             +---------+---------------+---------+-----------+----------+--------------+  FV Mid    Full                                                             +---------+---------------+---------+-----------+----------+--------------+  FV Distal Full                                                             +---------+---------------+---------+-----------+----------+--------------+  PFV       Full                                                             +---------+---------------+---------+-----------+----------+--------------+  POP       Full            No        Yes                                    +---------+---------------+---------+-----------+----------+--------------+  PTV       Full                                                             +---------+---------------+---------+-----------+----------+--------------+  PERO      Full                                                             +---------+---------------+---------+-----------+----------+--------------+   +---------+---------------+---------+-----------+----------+--------------+  LEFT      Compressibility Phasicity Spontaneity Properties Thrombus Aging  +---------+---------------+---------+-----------+----------+--------------+  CFV       Full            Yes       Yes                                    +---------+---------------+---------+-----------+----------+--------------+  SFJ       Full                                                             +---------+---------------+---------+-----------+----------+--------------+  FV Prox   Full                                                             +---------+---------------+---------+-----------+----------+--------------+  FV Mid    Full                                                              +---------+---------------+---------+-----------+----------+--------------+  FV Distal Full                                                             +---------+---------------+---------+-----------+----------+--------------+  PFV       Full                                                             +---------+---------------+---------+-----------+----------+--------------+  POP       Full            Yes       Yes                                    +---------+---------------+---------+-----------+----------+--------------+  PTV       Full                                                             +---------+---------------+---------+-----------+----------+--------------+  PERO      Full                                                             +---------+---------------+---------+-----------+----------+--------------+     Summary: Right: There is no evidence of deep vein thrombosis in the lower extremity. No cystic structure found in the popliteal fossa. Left: There is no evidence of deep vein thrombosis in the lower extremity. No cystic structure found in the popliteal fossa.  *See table(s) above for measurements and observations. Electronically signed by Ruta Hinds MD on 10/04/2019 at 3:31:58 PM.    Final

## 2019-10-15 NOTE — Plan of Care (Signed)
  Problem: Elimination: Goal: Will not experience complications related to bowel motility Outcome: Progressing Goal: Will not experience complications related to urinary retention Outcome: Progressing   

## 2019-10-15 NOTE — Progress Notes (Signed)
Physical Therapy Treatment Patient Details Name: William [name] MRN: [id number] DOB: 08-24-1956 Today's Date: 10/15/2019    History of Present Illness Patient is a 63 y.o. male with PMHx of HTN, dyslipidemia, DM-2, CAD-who presented with shortness of breath and cough-he was found to have acute hypoxic respiratory failure secondary to COVID-19 pneumonia.    PT Comments    Patient again tolerating activity poorly with sats at down to 69% with stand-pivot bed to chair despite 15L HFNC and PRB. Focused on breathing techniques to slow his breathing and incr depth of breaths with pt responding best to pursed lip breathing with PT counting, pt's eyes closed and playing his favorite jazz. After 10 minutes recovered to 80% and after 15 minutes 89%. RN in at end of session and updated on pt status. Pt wanting to brush teeth and RN staying to monitor as pt removes mask to brush.    Follow Up Recommendations  Other (comment)(currently medically declined; will monitor ?need CIR)     Equipment Recommendations  None recommended by PT    Recommendations for Other Services       Precautions / Restrictions Precautions Precaution Comments: sats drop quickly Restrictions Weight Bearing Restrictions: No    Mobility  Bed Mobility               General bed mobility comments: sitting EOB  Transfers Overall transfer level: Needs assistance Equipment used: None Transfers: Sit to/from Stand;Stand Pivot Transfers Sit to Stand: Min guard Stand pivot transfers: Min guard       General transfer comment: session focused on breathing and relaxation to incr sats, decr RR; incr time at EOB prior to transfer with sats decr to 76% with both 15L HFNC and PRB mask; during transfer decr to 69% and once seated, supported in recliner vc for pursed lip breathing with trying to count 1-4, then 1-3 for inhale and exhale with sats up to 80% after 10 minutes; initiated jazz music and eyes closed with pt then  increasing to 89%; as soon as he talks decreases to 79%  Ambulation/Gait                 Stairs             Wheelchair Mobility    Modified Rankin (Stroke Patients Only)       Balance Overall balance assessment: Mild deficits observed, not formally tested                                          Cognition Arousal/Alertness: Awake/alert Behavior During Therapy: Anxious Overall Cognitive Status: Within Functional Limits for tasks assessed                                 General Comments: on arrival, seated with both HFNC and PRB with sats 82%       Exercises Other Exercises Other Exercises: Noted pt attempting to use pillow to abdomen for diaphragmatic breathing and doing this backwards (pushing in on pillow during inspiration/drawing stomach in), attempted to re-educate with pt having great difficulty with RR increasing and sats down to 78%; re-educated on pursed lip breathing with pt returning to correct technique more quickly than 11/27;  RR to 30 and sats slowly increasing; however as soon as he begins to speak, sats begin falling  General Comments General comments (skin integrity, edema, etc.): obtained items for pt to brush his teeth sitting in chair--drops too quickly with removing PRB to brush to try in standing      Pertinent Vitals/Pain Pain Assessment: No/denies pain    Home Living Family/patient expects to be discharged to:: Private residence Living Arrangements: Spouse/significant other;Children Available Help at Discharge: Family Type of Home: House Home Access: Stairs to enter   Home Layout: Two level;Able to live on main level with bedroom/bathroom Home Equipment: None      Prior Function Level of Independence: Independent      Comments: works   PT Goals (current goals can now be found in the care plan section) Acute Rehab PT Goals Patient Stated Goal: to be able to walk in the park Time For Goal  Achievement: 11/03/19 Potential to Achieve Goals: Fair Progress towards PT goals: Goals downgraded-see care plan    Frequency    Min 3X/week      PT Plan Other (comment)(will need to assess as medically improves--may need CIR )    Co-evaluation              AM-PAC PT "6 Clicks" Mobility   Outcome Measure  Help needed turning from your back to your side while in a flat bed without using bedrails?: None Help needed moving from lying on your back to sitting on the side of a flat bed without using bedrails?: None Help needed moving to and from a bed to a chair (including a wheelchair)?: A Little Help needed standing up from a chair using your arms (e.g., wheelchair or bedside chair)?: A Little Help needed to walk in hospital room?: Total Help needed climbing 3-5 steps with a railing? : Total 6 Click Score: 16    End of Session Equipment Utilized During Treatment: Oxygen Activity Tolerance: Treatment limited secondary to medical complications (Comment) Patient left: in chair;with call bell/phone within reach;with nursing/sitter in room   PT Visit Diagnosis: Unsteadiness on feet (R26.81)     Time: [id number] PT Time Calculation (min) (ACUTE ONLY): 39 min  Charges:  $Self Care/Home Management: 23-37                      Barry Brunner, PT Pager [phone]    Rexanne Mano 10/15/2019, 11:55 AM

## 2019-10-16 LAB — COMPREHENSIVE METABOLIC PANEL
ALT: 51 U/L — ABNORMAL HIGH (ref 0–44)
AST: 39 U/L (ref 15–41)
Albumin: 2.7 g/dL — ABNORMAL LOW (ref 3.5–5.0)
Alkaline Phosphatase: 127 U/L — ABNORMAL HIGH (ref 38–126)
Anion gap: 8 (ref 5–15)
BUN: 48 mg/dL — ABNORMAL HIGH (ref 8–23)
CO2: 25 mmol/L (ref 22–32)
Calcium: 8.6 mg/dL — ABNORMAL LOW (ref 8.9–10.3)
Chloride: 107 mmol/L (ref 98–111)
Creatinine, Ser: 1.25 mg/dL — ABNORMAL HIGH (ref 0.61–1.24)
GFR calc Af Amer: >60 mL/min (ref >60)
GFR calc non Af Amer: >60 mL/min (ref >60)
Glucose, Bld: 44 mg/dL — CL (ref 70–99)
Potassium: 4.9 mmol/L (ref 3.5–5.1)
Sodium: 140 mmol/L (ref 135–145)
Total Bilirubin: 0.9 mg/dL (ref 0.3–1.2)
Total Protein: 6.4 g/dL — ABNORMAL LOW (ref 6.5–8.1)

## 2019-10-16 LAB — MAGNESIUM: Magnesium: 2.5 mg/dL — ABNORMAL HIGH (ref 1.7–2.4)

## 2019-10-16 LAB — GLUCOSE, CAPILLARY
Glucose-Capillary: 191 mg/dL — ABNORMAL HIGH (ref 70–99)
Glucose-Capillary: 283 mg/dL — ABNORMAL HIGH (ref 70–99)
Glucose-Capillary: 314 mg/dL — ABNORMAL HIGH (ref 70–99)
Glucose-Capillary: 327 mg/dL — ABNORMAL HIGH (ref 70–99)
Glucose-Capillary: 415 mg/dL — ABNORMAL HIGH (ref 70–99)
Glucose-Capillary: 433 mg/dL — ABNORMAL HIGH (ref 70–99)
Glucose-Capillary: 59 mg/dL — ABNORMAL LOW (ref 70–99)
Glucose-Capillary: 81 mg/dL (ref 70–99)
Glucose-Capillary: 96 mg/dL (ref 70–99)

## 2019-10-16 LAB — CBC WITH DIFFERENTIAL/PLATELET
Abs Immature Granulocytes: 0.23 10*3/uL — ABNORMAL HIGH (ref 0.00–0.07)
Basophils Absolute: 0 10*3/uL (ref 0.0–0.1)
Basophils Relative: 0 %
Eosinophils Absolute: 0.2 10*3/uL (ref 0.0–0.5)
Eosinophils Relative: 1 %
HCT: 40.9 % (ref 39.0–52.0)
Hemoglobin: 13 g/dL (ref 13.0–17.0)
Immature Granulocytes: 2 %
Lymphocytes Relative: 4 %
Lymphs Abs: 0.6 10*3/uL — ABNORMAL LOW (ref 0.7–4.0)
MCH: 25 pg — ABNORMAL LOW (ref 26.0–34.0)
MCHC: 31.8 g/dL (ref 30.0–36.0)
MCV: 78.7 fL — ABNORMAL LOW (ref 80.0–100.0)
Monocytes Absolute: 0.3 10*3/uL (ref 0.1–1.0)
Monocytes Relative: 2 %
Neutro Abs: 13.2 10*3/uL — ABNORMAL HIGH (ref 1.7–7.7)
Neutrophils Relative %: 91 %
Platelets: 451 10*3/uL — ABNORMAL HIGH (ref 150–400)
RBC: 5.2 MIL/uL (ref 4.22–5.81)
RDW: 15.1 % (ref 11.5–15.5)
WBC: 14.5 10*3/uL — ABNORMAL HIGH (ref 4.0–10.5)
nRBC: 0 % (ref 0.0–0.2)

## 2019-10-16 LAB — D-DIMER, QUANTITATIVE: D-Dimer, Quant: 7.41 ug/mL-FEU — ABNORMAL HIGH (ref 0.00–0.50)

## 2019-10-16 LAB — BRAIN NATRIURETIC PEPTIDE: B Natriuretic Peptide: 50.6 pg/mL (ref 0.0–100.0)

## 2019-10-16 LAB — C-REACTIVE PROTEIN: CRP: <0.8 mg/dL (ref <1.0)

## 2019-10-16 LAB — PROCALCITONIN: Procalcitonin: 0.14 ng/mL

## 2019-10-16 MED ORDER — BOOST PLUS PO LIQD
237.0000 mL | Freq: Two times a day (BID) | ORAL | Status: DC
  Administered 2019-10-16 – 2019-10-17 (×3): 237 mL via ORAL
  Filled 2019-10-16 (×4): qty 237

## 2019-10-16 MED ORDER — PRO-STAT SUGAR FREE PO LIQD
30.0000 mL | Freq: Three times a day (TID) | ORAL | Status: DC
  Administered 2019-10-16 – 2019-10-20 (×14): 30 mL via ORAL
  Filled 2019-10-16 (×15): qty 30

## 2019-10-16 MED ORDER — FUROSEMIDE 10 MG/ML IJ SOLN
40.0000 mg | Freq: Once | INTRAMUSCULAR | Status: AC
  Administered 2019-10-16: 11:00:00 40 mg via INTRAVENOUS
  Filled 2019-10-16: qty 4

## 2019-10-16 MED ORDER — INSULIN ASPART 100 UNIT/ML ~~LOC~~ SOLN
25.0000 [IU] | Freq: Once | SUBCUTANEOUS | Status: AC
  Administered 2019-10-16: 25 [IU] via SUBCUTANEOUS

## 2019-10-16 MED ORDER — GLUCERNA 1.2 CAL PO LIQD
1000.0000 mL | ORAL | Status: DC
  Filled 2019-10-16: qty 1000

## 2019-10-16 MED ORDER — CARVEDILOL 3.125 MG PO TABS
6.2500 mg | ORAL_TABLET | Freq: Once | ORAL | Status: AC
  Administered 2019-10-16: 6.25 mg via ORAL
  Filled 2019-10-16: qty 2

## 2019-10-16 MED ORDER — INSULIN ASPART 100 UNIT/ML ~~LOC~~ SOLN
0.0000 [IU] | Freq: Three times a day (TID) | SUBCUTANEOUS | Status: DC
  Administered 2019-10-16: 7 [IU] via SUBCUTANEOUS
  Administered 2019-10-17: 5 [IU] via SUBCUTANEOUS
  Administered 2019-10-17 – 2019-10-18 (×2): 3 [IU] via SUBCUTANEOUS
  Administered 2019-10-18: 5 [IU] via SUBCUTANEOUS
  Administered 2019-10-18: 7 [IU] via SUBCUTANEOUS

## 2019-10-16 MED ORDER — INSULIN DETEMIR 100 UNIT/ML ~~LOC~~ SOLN
9.0000 [IU] | Freq: Every day | SUBCUTANEOUS | Status: DC
  Filled 2019-10-16: qty 0.09

## 2019-10-16 MED ORDER — METOLAZONE 2.5 MG PO TABS
2.5000 mg | ORAL_TABLET | Freq: Once | ORAL | Status: AC
  Administered 2019-10-16: 2.5 mg via ORAL
  Filled 2019-10-16: qty 1

## 2019-10-16 MED ORDER — ENOXAPARIN SODIUM 100 MG/ML ~~LOC~~ SOLN
1.0000 mg/kg | Freq: Two times a day (BID) | SUBCUTANEOUS | Status: DC
  Administered 2019-10-16 – 2019-10-19 (×6): 90 mg via SUBCUTANEOUS
  Filled 2019-10-16 (×8): qty 1

## 2019-10-16 MED ORDER — CARVEDILOL 12.5 MG PO TABS
12.5000 mg | ORAL_TABLET | Freq: Two times a day (BID) | ORAL | Status: DC
  Administered 2019-10-16 – 2019-10-21 (×8): 12.5 mg via ORAL
  Filled 2019-10-16 (×12): qty 1

## 2019-10-16 MED ORDER — INSULIN ASPART 100 UNIT/ML ~~LOC~~ SOLN
0.0000 [IU] | Freq: Every day | SUBCUTANEOUS | Status: DC
  Administered 2019-10-16: 22:00:00 3 [IU] via SUBCUTANEOUS

## 2019-10-16 MED ORDER — INSULIN DETEMIR 100 UNIT/ML ~~LOC~~ SOLN
9.0000 [IU] | Freq: Every day | SUBCUTANEOUS | Status: DC
  Administered 2019-10-16 – 2019-10-17 (×2): 9 [IU] via SUBCUTANEOUS
  Filled 2019-10-16 (×2): qty 0.09

## 2019-10-16 NOTE — Progress Notes (Addendum)
PROGRESS NOTE                                                                                                                                                                                                             Patient Demographics:    William [name], is a 63 y.o. male, DOB - [birth year], [id number]  Outpatient Primary MD for the patient is [name], MD   Admit date - 09/30/2019   LOS - 10  Chief Complaint  Patient presents with   COVID positive   Shortness of Breath       Brief Narrative: Patient is a 63 y.o. male with PMHx of HTN, dyslipidemia, DM-2, CAD-who presented with shortness of breath and cough-he was found to have acute hypoxic respiratory failure secondary to COVID-19 pneumonia.  Significant events: 11/16>> admit to [place] 11/17>> transferred to ICU for worsening hypoxemia requiring heated high flow 11/20>> transferred back to PCU  COVID-19 Medications: Steroids:11/16>> Remdesivir:11/15>>11/19 Actemra:11/17>>x 1 Convalescent Plasma:11/17>>x1   Subjective:   Patient in bed, appears comfortable, denies any headache, no fever, no chest pain or pressure, improved shortness of breath , no abdominal pain. No focal weakness.    Assessment  & Plan :   Acute Hypoxic Resp Failure due to Covid 19 Viral pneumonia: has severe pulmonary disease, he has been treated so far with steroids, remdesivir, convalescent plasma along with Actemra, he was treated in ICU and stabilized and transferred to my care on 10/09/2019 on combination of high flow oxygen and nonrebreather mask.    He is still extremely hypoxic and still requiring both high flow oxygen and nonrebreather mask.  Steroids being tapered as inflammatory markers are improving, his hypoxia has improved on 10/16/2019 once his pulse ox probe was switched to his earlobe, currently on high flow 15 L nasal cannula oxygen plus nonrebreather but likely  will come off of normal results soon if he continues to improve but still overall tenuous, continue Lasix for diuresis, in case he continues to remain very tenuous he might require getting transferred back to ICU if he does not turn around.  Updated wife again on the 28th about his poor prognosis.  Case also discussed with pulmonary physician Dr. Lake Bells on 24th.  If he gets any worse he might require being transferred back to ICU.  Was encouraged to sit up in  chair in the daytime use I-S and flutter valve for pulmonary toiletry and then prone at bed at night.  SpO2: 91 % O2 Flow Rate (L/min): 15 L/min FiO2 (%): 60 %   COVID-19 Labs: Recent Labs    10/14/19 0320 10/15/19 0203 10/16/19 0355  DDIMER 11.59* 8.84* 7.41*  FERRITIN 249 259  --   LDH 749* 659*  --   CRP <0.8 <0.8 <0.8    Lab Results  Component Value Date   SARSCOV2NAA POSITIVE (A) 10/08/2019     AKI: Improved-suspect may have developed some mild CKD stage III , renal function seems to have stabilized with a creatinine of around 1.4.  Acute on Chronic diastolic heart failure EF 60% on recent echo: Appears to be in fluid overload, continue IV Lasix, Zaroxolyn added on 10/16/2019, Coreg dose increased on 10/16/2019.  HTN: Appears to be poorly controlled, currently on Coreg and Norvasc, Coreg dose increased on 10/16/2019.  Dyslipidemia: Continue statin  History of CAD: No anginal symptoms-continue dual antiplatelet agents, statin and metoprolol.  Remote history of DVT: ++ D dimer , negative CT and lower extremity venous duplex.  Improving with high-dose Lovenox continue.  Obesity: BMI 35.  Follow with PCP for weight loss.   Leukocytosis - CXR better, procalcitonin stable, likely steroid related, will monitor.  DM-2 (A1c 8.1 on 10/04/2019): Poor outpatient control due to hyperglycemia.  On Lantus sliding scale and premeal NovoLog. Insulin dose reduced on 10/16/2019.  CBG (last 3)  Recent Labs    10/16/19 0036  10/16/19 0449 10/16/19 0853  GLUCAP 96 81 191*     GI prophylaxis: H2 Blocker  Consults  :  PCCM  Procedures  :    CTA and Leg [place] - No Clots  TTE -   1. Left ventricular ejection fraction, by visual estimation, is 60 to 65%. The left ventricle has normal function. There is moderately increased left ventricular hypertrophy.  2. Left ventricular diastolic parameters are consistent with Grade I diastolic dysfunction (impaired relaxation).  3. Global right ventricle has normal systolic function.The right ventricular size is normal.  4. Right atrial size was normal.  5. The mitral valve is normal in structure. No evidence of mitral valve regurgitation. No evidence of mitral stenosis.  6. The tricuspid valve is normal in structure. Tricuspid valve regurgitation is trivial.  7. The aortic valve is normal in structure. Aortic valve regurgitation is not visualized. No evidence of aortic valve sclerosis or stenosis.  8. The pulmonic valve was normal in structure. Pulmonic valve regurgitation is not visualized.  9. Mildly elevated pulmonary artery systolic pressure. 10. The inferior vena cava is normal in size with greater than 50% respiratory variability, suggesting right atrial pressure of 3 mmHg. 11. Normal LV systolic function; moderate LVH; grade 1 diastolic dysfunction.   Condition - Extremely Guarded  Family Communication  : spouse on 11/21, 10/10/19, 11/28, 11/29  Code Status :  No Intubation - confirmed with patient and wife on 10/16/2019.  Diet :   Diet Order            Diet Carb Modified Fluid consistency: Thin; Room service appropriate? Yes  Diet effective now               Disposition Plan  :  Remain hospitalized-suspect can be transferred to PCU later today if he remains stable on regular high flow.  Barriers to discharge: Hypoxia requiring O2 supplementation/complete 5 days of IV Remdesivir  Antimicorbials  :    Anti-infectives (From  admission, onward)    Start     Dose/Rate Route Frequency Ordered Stop   10/04/19 1600  remdesivir 100 mg in sodium chloride 0.9 % 250 mL IVPB     100 mg 500 mL/hr over 30 Minutes Intravenous Daily 10/10/2019 2358 10/06/19 1140   09/28/2019 2200  remdesivir 100 mg in sodium chloride 0.9 % 250 mL IVPB  Status:  Discontinued     100 mg 500 mL/hr over 30 Minutes Intravenous Every 24 hours 10/11/2019 1537 10/14/2019 2358     DVT Prophylaxis  :  Lovenox    Inpatient Medications  Scheduled Meds:  amLODipine  10 mg Oral Daily   aspirin EC  81 mg Oral Daily   atorvastatin  40 mg Oral q1800   carvedilol  6.25 mg Oral BID WC   Chlorhexidine Gluconate Cloth  6 each Topical Daily   dextrose  25 mL Intravenous Once   enoxaparin (LOVENOX) injection  100 mg Subcutaneous Q12H   famotidine  20 mg Oral Daily   feeding supplement (PRO-STAT SUGAR FREE 64)  30 mL Oral TID WC   furosemide  40 mg Intravenous Once   insulin aspart  0-5 Units Subcutaneous QHS   insulin aspart  0-9 Units Subcutaneous TID WC   insulin detemir  9 Units Subcutaneous QHS   Ipratropium-Albuterol  2 puff Inhalation Q6H   methylPREDNISolone (SOLU-MEDROL) injection  40 mg Intravenous Daily   metolazone  2.5 mg Oral Once   multivitamin with minerals  1 tablet Oral Daily   ticagrelor  90 mg Oral BID   vitamin B-12  100 mcg Oral Daily   vitamin C  500 mg Oral Daily   zinc sulfate  220 mg Oral Daily   Continuous Infusions:  feeding supplement (GLUCERNA 1.2 CAL)     PRN Meds:.acetaminophen, lip balm, nitroGLYCERIN, [DISCONTINUED] ondansetron **OR** ondansetron (ZOFRAN) IV, sodium chloride, tetrahydrozoline  Time Spent in minutes  25   See all Orders from today for further details  Lala Lund M.D on 10/16/2019 at 9:45 AM  To page go to [email or web address] - use universal password  [facility] -  Office  [phone]    Objective:   Vitals:   10/15/19 1630 10/15/19 1944 10/16/19 0453 10/16/19 0728  BP: 140/74 (!) 154/72  (!) 147/73 (!) 161/78  Pulse: 73 74 66 79  Resp:  20 (!) 22 (!) 22  Temp: 97.6 F (36.4 C) 98 F (36.7 C) 97.8 F (36.6 C) 97.9 F (36.6 C)  TempSrc: Oral Axillary Oral Oral  SpO2: 97% 95% 92% 91%  Weight:   90.2 kg   Height:        Wt Readings from Last 3 Encounters:  10/16/19 90.2 kg  10/12/2019 102.1 kg  03/30/14 97.5 kg     Intake/Output Summary (Last 24 hours) at 10/16/2019 0945 Last data filed at 10/16/2019 0459 Gross per 24 hour  Intake 750 ml  Output 2475 ml  Net -1725 ml     Physical Exam  Awake Alert,   No new F.N deficits, Normal affect [place].AT,PERRAL Supple Neck,No JVD, No cervical lymphadenopathy appriciated.  Symmetrical Chest wall movement, Good air movement bilaterally, few rales RRR,No Gallops, Rubs or new Murmurs, No Parasternal Heave +ve B.Sounds, Abd Soft, No tenderness, No organomegaly appriciated, No rebound - guarding or rigidity. No Cyanosis, Clubbing or edema, No new Rash or bruise   Data Review:    CBC  Recent Labs  Lab 10/12/19 0420 10/13/19 0352 10/14/19 0320 10/15/19 0203 10/16/19 0355  WBC 12.9* 14.2* 15.9* 17.6* 14.5*  HGB 12.1* 12.3* 13.0 13.0 13.0  HCT 37.5* 38.5* 40.9 41.4 40.9  PLT 444* 464* 474* 470* 451*  MCV 77.8* 78.1* 78.5* 79.2* 78.7*  MCH 25.1* 24.9* 25.0* 24.9* 25.0*  MCHC 32.3 31.9 31.8 31.4 31.8  RDW 14.6 14.6 14.8 15.0 15.1  LYMPHSABS 0.2* 0.3* 0.4* 0.3* 0.6*  MONOABS 0.2 0.2 0.2 0.2 0.3  EOSABS 0.0 0.1 0.0 0.1 0.2  BASOSABS 0.0 0.0 0.0 0.0 0.0    Chemistries   Recent Labs  Lab 10/12/19 0420 10/13/19 0352 10/14/19 0320 10/15/19 0203 10/16/19 0355  NA 137 141 142 143 140  K 5.5* 5.0 5.1 4.9 4.9  CL 105 108 108 108 107  CO2 22 24 24 25 25   GLUCOSE 345* 126* 77 62* 44*  BUN 47* 45* 33* 38* 48*  CREATININE 1.45* 1.32* 1.16 1.31* 1.25*  CALCIUM 8.7* 8.7* 8.8* 8.7* 8.6*  MG 2.7* 2.7* 2.6* 2.8* 2.5*  AST 33 40 44* 40 39  ALT 42 43 48* 51* 51*  ALKPHOS 187* 187* 183* 153* 127*  BILITOT 0.3 0.4 0.5  0.7 0.9   ------------------------------------------------------------------------------------------------------------------ No results for input(s): CHOL, HDL, LDLCALC, TRIG, CHOLHDL, LDLDIRECT in the last 72 hours.  Lab Results  Component Value Date   HGBA1C 8.1 (H) 10/17/2019   ------------------------------------------------------------------------------------------------------------------ No results for input(s): TSH, T4TOTAL, T3FREE, THYROIDAB in the last 72 hours.  Invalid input(s): FREET3 ------------------------------------------------------------------------------------------------------------------  Micro Results No results found for this or any previous visit (from the past 240 hour(s)).  Radiology Reports [place]  Result Date: 10/09/2019 CLINICAL DATA:  COVID pneumonia, shortness of breath, concern for PE EXAM: CT ANGIOGRAPHY CHEST WITH CONTRAST TECHNIQUE: Multidetector CT imaging of the chest was performed using the standard protocol during bolus administration of intravenous contrast. Multiplanar CT image reconstructions and MIPs were obtained to evaluate the vascular anatomy. CONTRAST:  32mL OMNIPAQUE IOHEXOL 350 MG/ML SOLN COMPARISON:  None. FINDINGS: Cardiovascular: No significant filling defect or pulmonary embolus demonstrated by CTA. Pulmonary arteries appear patent. Limited assessment of the lower lobes with motion artifact from breathing. Minor aortic atherosclerosis. Negative for aneurysm or dissection. Patent two-vessel arch anatomy. No mediastinal hemorrhage or hematoma. Native coronary atherosclerosis noted. Heart size without pericardial effusion Mediastinum/Nodes: Prominent thyroid noted extending substernal. Trachea and esophagus unremarkable. Esophagus is nondilated. Mildly prominent prevascular, hilar and subcarinal lymph nodes, suspect reactive/inflammatory. No bulky adenopathy. Lungs/Pleura: Extensive diffuse and patchy ground-glass opacities throughout all  lobes of both lungs with some sparing of the lung apices. There is associated bibasilar consolidation/atelectasis with central air bronchograms. Appearance compatible with atypical pneumonia/viral pneumonia as seen with COVID-19. No significant effusion. No other pleural abnormality or pneumothorax. Trachea and central airways remain patent. No mucous plugging or obstruction. Upper Abdomen: Fatty infiltration of the liver suspected. No acute finding in the upper abdomen. Musculoskeletal: No chest wall abnormality. No acute or significant osseous findings. Review of the MIP images confirms the above findings. IMPRESSION: 1. Negative for significant acute pulmonary embolus by CTA. 2. Extensive diffuse and patchy ground-glass opacities throughout all lobes of both lungs with some sparing of the lung apices. Findings compatible with atypical pneumonia/viral pneumonia as seen with COVID-19. Aortic Atherosclerosis (ICD10-I70.0). Electronically Signed   By: Jerilynn Mages.  Shick M.D.   On: 10/09/2019 09:23   Dg Chest Port 1 View  Result Date: 10/15/2019 CLINICAL DATA:  COVID-19 positive, shortness of breath. EXAM: PORTABLE CHEST 1 VIEW COMPARISON:  Chest x-rays dated 10/13/2019 and 10/04/2019. FINDINGS: Ground-glass opacities are  again seen at the lung bases bilaterally, improved compared to the most recent chest x-ray of 10/13/2019, now similar to the earlier plain film of 10/04/2019. No pneumothorax seen. Heart size and mediastinal contours are stable. IMPRESSION: Bibasilar pneumonia, improved compared to most recent chest x-ray of 10/13/2019. Electronically Signed   By: Franki Cabot M.D.   On: 10/15/2019 08:20   Dg Chest Port 1 View  Result Date: 10/13/2019 CLINICAL DATA:  Shortness of breath, COVID-19 EXAM: PORTABLE CHEST 1 VIEW COMPARISON:  CTA chest dated 10/09/2019 FINDINGS: Multifocal pneumonia with relative sparing of the left upper lobe. No definite pleural effusions. No pneumothorax. Cardiomegaly. IMPRESSION:  Multifocal pneumonia in this patient with known COVID. Electronically Signed   By: Julian Hy M.D.   On: 10/13/2019 09:44   Dg Chest Port 1 View  Result Date: 10/04/2019 CLINICAL DATA:  Hypoxia EXAM: PORTABLE CHEST 1 VIEW COMPARISON:  10/04/2019 FINDINGS: Interstitial/patchy opacities in the bilateral upper and lower lobes, unchanged. No definite pleural effusions. No pneumothorax. The heart is top-normal in size. IMPRESSION: Multifocal pneumonia in this patient with known COVID, unchanged. Electronically Signed   By: Julian Hy M.D.   On: 10/04/2019 10:07   Dg Chest Portable 1 View  Result Date: 10/16/2019 CLINICAL DATA:  Hypoxia, shortness of breath. EXAM: PORTABLE CHEST 1 VIEW COMPARISON:  October 02, 2019. FINDINGS: The heart size and mediastinal contours are within normal limits. No pneumothorax or pleural effusion is noted. Stable left perihilar and lingular opacity is noted as well as mild right upper lobe opacity consistent with multifocal pneumonia. The visualized skeletal structures are unremarkable. IMPRESSION: Stable bilateral lung opacities are noted, left greater than right, consistent with multifocal pneumonia. Electronically Signed   By: Marijo Conception M.D.   On: 10/02/2019 14:14   Dg Chest Port 1 View  Result Date: 09/29/2019 CLINICAL DATA:  Pt complains of chest pain x 3 days, non-radiating, worse with inspiration, cough, fever, and generally feeling unwell. Pt was unable to hold deep inspiration for x-ray due to coughing. EXAM: PORTABLE CHEST 1 VIEW COMPARISON:  01/07/2014 FINDINGS: Hazy interstitial and airspace opacities present in the left perihilar region, right upper lobe, and left lower lobe. Low lung volumes are present, causing crowding of the pulmonary vasculature. No blunting of the costophrenic angles. Cardiac and mediastinal margins appear normal. IMPRESSION: 1. Hazy interstitial and airspace opacities in the left perihilar region, right upper lobe, and  left lower lobe, suspicious for multilobar pneumonia or atypical pneumonia. 2. Low lung volumes. Electronically Signed   By: Van Clines M.D.   On: 09/26/2019 18:57   Leg [place] [facility]  Result Date: 10/10/2019  Lower Venous Study Indications: Covid positive, elevated D-Dimer.  Comparison Study: Prior negative Bilateral LEV done 10/04/19 Performing Technologist: Sharion Dove RVS  Examination Guidelines: A complete evaluation includes B-mode imaging, spectral Doppler, color Doppler, and power Doppler as needed of all accessible portions of each vessel. Bilateral testing is considered an integral part of a complete examination. Limited examinations for reoccurring indications may be performed as noted.  +---------+---------------+---------+-----------+----------+--------------+  RIGHT     Compressibility Phasicity Spontaneity Properties Thrombus Aging  +---------+---------------+---------+-----------+----------+--------------+  CFV       Full            Yes       Yes                                    +---------+---------------+---------+-----------+----------+--------------+  SFJ       Full                                                             +---------+---------------+---------+-----------+----------+--------------+  FV Prox   Full                                                             +---------+---------------+---------+-----------+----------+--------------+  FV Mid    Full                                                             +---------+---------------+---------+-----------+----------+--------------+  FV Distal Full                                                             +---------+---------------+---------+-----------+----------+--------------+  PFV       Full                                                             +---------+---------------+---------+-----------+----------+--------------+  POP       Full            Yes       Yes                                     +---------+---------------+---------+-----------+----------+--------------+  PTV       Full                                                             +---------+---------------+---------+-----------+----------+--------------+  PERO      Full                                                             +---------+---------------+---------+-----------+----------+--------------+  Gastroc   None                                             Acute           +---------+---------------+---------+-----------+----------+--------------+   +---------+---------------+---------+-----------+----------+--------------+  LEFT      Compressibility Phasicity Spontaneity Properties Thrombus Aging  +---------+---------------+---------+-----------+----------+--------------+  CFV       Full            Yes       Yes                                    +---------+---------------+---------+-----------+----------+--------------+  SFJ       Full                                                             +---------+---------------+---------+-----------+----------+--------------+  FV Prox   Full                                                             +---------+---------------+---------+-----------+----------+--------------+  FV Mid    Full                                                             +---------+---------------+---------+-----------+----------+--------------+  FV Distal Full                                                             +---------+---------------+---------+-----------+----------+--------------+  PFV       Full                                                             +---------+---------------+---------+-----------+----------+--------------+  POP       Full            Yes       Yes                                    +---------+---------------+---------+-----------+----------+--------------+  PTV       Full                                                              +---------+---------------+---------+-----------+----------+--------------+  PERO      Full                                                             +---------+---------------+---------+-----------+----------+--------------+  Gastroc   None                                             Acute           +---------+---------------+---------+-----------+----------+--------------+     Summary: Right: Findings consistent with acute deep vein thrombosis involving the right gastrocnemius veins. Left: Findings consistent with acute deep vein thrombosis involving the left gastrocnemius veins.  *See table(s) above for measurements and observations. Electronically signed by Curt Jews MD on 10/10/2019 at 1:39:57 PM.    Final    [place] Ven[place]  Result Date: 10/04/2019  Lower Venous Study Indications: Covid-19 positive, and Swelling.  Anticoagulation: Lovenox. Comparison Study: No priors Performing Technologist: Velva Harman Sturdivant RDMS, RVT  Examination Guidelines: A complete evaluation includes B-mode imaging, spectral Doppler, color Doppler, and power Doppler as needed of all accessible portions of each vessel. Bilateral testing is considered an integral part of a complete examination. Limited examinations for reoccurring indications may be performed as noted.  +---------+---------------+---------+-----------+----------+--------------+  RIGHT     Compressibility Phasicity Spontaneity Properties Thrombus Aging  +---------+---------------+---------+-----------+----------+--------------+  CFV       Full            Yes       Yes                                    +---------+---------------+---------+-----------+----------+--------------+  SFJ       Full                                                             +---------+---------------+---------+-----------+----------+--------------+  FV Prox   Full                                                              +---------+---------------+---------+-----------+----------+--------------+  FV Mid    Full                                                             +---------+---------------+---------+-----------+----------+--------------+  FV Distal Full                                                             +---------+---------------+---------+-----------+----------+--------------+  PFV       Full                                                             +---------+---------------+---------+-----------+----------+--------------+  POP       Full            No        Yes                                    +---------+---------------+---------+-----------+----------+--------------+  PTV       Full                                                             +---------+---------------+---------+-----------+----------+--------------+  PERO      Full                                                             +---------+---------------+---------+-----------+----------+--------------+   +---------+---------------+---------+-----------+----------+--------------+  LEFT      Compressibility Phasicity Spontaneity Properties Thrombus Aging  +---------+---------------+---------+-----------+----------+--------------+  CFV       Full            Yes       Yes                                    +---------+---------------+---------+-----------+----------+--------------+  SFJ       Full                                                             +---------+---------------+---------+-----------+----------+--------------+  FV Prox   Full                                                             +---------+---------------+---------+-----------+----------+--------------+  FV Mid    Full                                                             +---------+---------------+---------+-----------+----------+--------------+  FV Distal Full                                                              +---------+---------------+---------+-----------+----------+--------------+  PFV       Full                                                             +---------+---------------+---------+-----------+----------+--------------+  POP       Full            Yes       Yes                                    +---------+---------------+---------+-----------+----------+--------------+  PTV       Full                                                             +---------+---------------+---------+-----------+----------+--------------+  PERO      Full                                                             +---------+---------------+---------+-----------+----------+--------------+     Summary: Right: There is no evidence of deep vein thrombosis in the lower extremity. No cystic structure found in the popliteal fossa. Left: There is no evidence of deep vein thrombosis in the lower extremity. No cystic structure found in the popliteal fossa.  *See table(s) above for measurements and observations. Electronically signed by Ruta Hinds MD on 10/04/2019 at 3:31:58 PM.    Final

## 2019-10-16 NOTE — Plan of Care (Signed)
  Problem: Respiratory: Goal: Will maintain a patent airway Outcome: Progressing Goal: Complications related to the disease process, condition or treatment will be avoided or minimized Outcome: Progressing   

## 2019-10-16 NOTE — Progress Notes (Signed)
ANTICOAGULATION CONSULT NOTE   Pharmacy Consult for Lovenox Indication: VTE treatment  Patient Measurements: Height: 5\' 7"  (170.2 cm) Weight: 198 lb 13.7 oz (90.2 kg) IBW/kg (Calculated) : 66.1  Vital Signs: Temp: 98.5 F (36.9 C) (11/29 1147) Temp Source: Oral (11/29 1147) BP: 140/72 (11/29 1147) Pulse Rate: 81 (11/29 1147)  Labs: Recent Labs    10/14/19 0320 10/15/19 0203 10/16/19 0355  HGB 13.0 13.0 13.0  HCT 40.9 41.4 40.9  PLT 474* 470* 451*  CREATININE 1.16 1.31* 1.25*    Estimated Creatinine Clearance: 65.6 mL/min (A) (by C-G formula based on SCr of 1.25 mg/dL (H)).  Medications:  Scheduled:  . amLODipine  10 mg Oral Daily  . aspirin EC  81 mg Oral Daily  . atorvastatin  40 mg Oral q1800  . carvedilol  12.5 mg Oral BID WC  . Chlorhexidine Gluconate Cloth  6 each Topical Daily  . dextrose  25 mL Intravenous Once  . enoxaparin (LOVENOX) injection  100 mg Subcutaneous Q12H  . famotidine  20 mg Oral Daily  . feeding supplement (PRO-STAT SUGAR FREE 64)  30 mL Oral TID WC  . insulin aspart  0-5 Units Subcutaneous QHS  . insulin aspart  0-9 Units Subcutaneous TID WC  . insulin detemir  9 Units Subcutaneous QHS  . Ipratropium-Albuterol  2 puff Inhalation Q6H  . lactose free nutrition  237 mL Oral BID  . methylPREDNISolone (SOLU-MEDROL) injection  40 mg Intravenous Daily  . multivitamin with minerals  1 tablet Oral Daily  . ticagrelor  90 mg Oral BID  . vitamin B-12  100 mcg Oral Daily  . vitamin C  500 mg Oral Daily  . zinc sulfate  220 mg Oral Daily   Infusions:    Assessment: 8 yoM admitted on 11/16 with COVID-19 pneumonia.  He has been on Lovenox for VTE prophylaxis (BID dosing while in ICU from 11/17-11/21). 11/17 Dopplers negative for DVT 11/22 Dopoplers positive for DVT Pharmacy was consulted to dose Lovenox treatment dose for VTE. -patient weight was recently updated and repeated for verification -Scr improved and CBC stable  -No bleeding  reported  Goal of Therapy:  Anti-Xa level 0.6-1 units/ml 4hrs after LMWH dose given Monitor platelets by anticoagulation protocol: Yes   Plan:  Change Lovenox dose based on updated weight 1 mg/kg (90mg ) [place] q12h Follow up renal function, CBC, s/s bleeding  Vallery Sa, PharmD, BCPS Clinical Pharmacist  10/16/2019 1:27 PM

## 2019-10-16 NOTE — Progress Notes (Signed)
CRITICAL VALUE STICKER  CRITICAL VALUE: blood glucose 44  RECEIVER (on-site recipient of call): Mariane Baumgarten RN  DATE & TIME NOTIFIED: 10/16/2019 @0810   MESSENGER (representative from lab): Effie Berkshire  MD NOTIFIED: Dr. Candiss Norse  TIME OF [id number]  RESPONSE: See new orders

## 2019-10-17 LAB — CBC WITH DIFFERENTIAL/PLATELET
Abs Immature Granulocytes: 0.08 10*3/uL — ABNORMAL HIGH (ref 0.00–0.07)
Basophils Absolute: 0 10*3/uL (ref 0.0–0.1)
Basophils Relative: 0 %
Eosinophils Absolute: 0.2 10*3/uL (ref 0.0–0.5)
Eosinophils Relative: 1 %
HCT: 39.3 % (ref 39.0–52.0)
Hemoglobin: 12.6 g/dL — ABNORMAL LOW (ref 13.0–17.0)
Immature Granulocytes: 1 %
Lymphocytes Relative: 4 %
Lymphs Abs: 0.5 10*3/uL — ABNORMAL LOW (ref 0.7–4.0)
MCH: 25 pg — ABNORMAL LOW (ref 26.0–34.0)
MCHC: 32.1 g/dL (ref 30.0–36.0)
MCV: 78 fL — ABNORMAL LOW (ref 80.0–100.0)
Monocytes Absolute: 0.3 10*3/uL (ref 0.1–1.0)
Monocytes Relative: 2 %
Neutro Abs: 12.2 10*3/uL — ABNORMAL HIGH (ref 1.7–7.7)
Neutrophils Relative %: 92 %
Platelets: 426 10*3/uL — ABNORMAL HIGH (ref 150–400)
RBC: 5.04 MIL/uL (ref 4.22–5.81)
RDW: 15 % (ref 11.5–15.5)
WBC: 13.2 10*3/uL — ABNORMAL HIGH (ref 4.0–10.5)
nRBC: 0 % (ref 0.0–0.2)

## 2019-10-17 LAB — GLUCOSE, CAPILLARY
Glucose-Capillary: 297 mg/dL — ABNORMAL HIGH (ref 70–99)
Glucose-Capillary: 194 mg/dL — ABNORMAL HIGH (ref 70–99)
Glucose-Capillary: 525 mg/dL (ref 70–99)
Glucose-Capillary: 55 mg/dL — ABNORMAL LOW (ref 70–99)
Glucose-Capillary: 177 mg/dL — ABNORMAL HIGH (ref 70–99)
Glucose-Capillary: 88 mg/dL (ref 70–99)
Glucose-Capillary: 239 mg/dL — ABNORMAL HIGH (ref 70–99)
Glucose-Capillary: 123 mg/dL — ABNORMAL HIGH (ref 70–99)

## 2019-10-17 LAB — COMPREHENSIVE METABOLIC PANEL
ALT: 56 U/L — ABNORMAL HIGH (ref 0–44)
AST: 33 U/L (ref 15–41)
Albumin: 2.8 g/dL — ABNORMAL LOW (ref 3.5–5.0)
Alkaline Phosphatase: 127 U/L — ABNORMAL HIGH (ref 38–126)
Anion gap: 11 (ref 5–15)
BUN: 68 mg/dL — ABNORMAL HIGH (ref 8–23)
CO2: 26 mmol/L (ref 22–32)
Calcium: 8.9 mg/dL (ref 8.9–10.3)
Chloride: 104 mmol/L (ref 98–111)
Creatinine, Ser: 1.43 mg/dL — ABNORMAL HIGH (ref 0.61–1.24)
GFR calc Af Amer: >60 mL/min (ref >60)
GFR calc non Af Amer: 52 mL/min — ABNORMAL LOW (ref >60)
Glucose, Bld: 214 mg/dL — ABNORMAL HIGH (ref 70–99)
Potassium: 4.5 mmol/L (ref 3.5–5.1)
Sodium: 141 mmol/L (ref 135–145)
Total Bilirubin: 0.6 mg/dL (ref 0.3–1.2)
Total Protein: 6.4 g/dL — ABNORMAL LOW (ref 6.5–8.1)

## 2019-10-17 LAB — PROCALCITONIN: Procalcitonin: 0.21 ng/mL

## 2019-10-17 LAB — C-REACTIVE PROTEIN: CRP: <0.8 mg/dL (ref <1.0)

## 2019-10-17 LAB — MAGNESIUM: Magnesium: 2.8 mg/dL — ABNORMAL HIGH (ref 1.7–2.4)

## 2019-10-17 LAB — D-DIMER, QUANTITATIVE: D-Dimer, Quant: 3.66 ug/mL-FEU — ABNORMAL HIGH (ref 0.00–0.50)

## 2019-10-17 LAB — BRAIN NATRIURETIC PEPTIDE: B Natriuretic Peptide: 54 pg/mL (ref 0.0–100.0)

## 2019-10-17 MED ORDER — INSULIN ASPART 100 UNIT/ML ~~LOC~~ SOLN: 30.0000 [IU] | Freq: Once | SUBCUTANEOUS | Status: DC

## 2019-10-17 MED ORDER — METHYLPREDNISOLONE SODIUM SUCC 40 MG IJ SOLR
30.0000 mg | Freq: Every day | INTRAMUSCULAR | Status: DC
  Administered 2019-10-17: 09:00:00 30 mg via INTRAVENOUS
  Filled 2019-10-17: qty 1

## 2019-10-17 MED ORDER — FUROSEMIDE 10 MG/ML IJ SOLN
40.0000 mg | Freq: Once | INTRAMUSCULAR | Status: AC
  Administered 2019-10-17: 40 mg via INTRAVENOUS
  Filled 2019-10-17: qty 4

## 2019-10-17 MED ORDER — INSULIN ASPART 100 UNIT/ML ~~LOC~~ SOLN
35.0000 [IU] | Freq: Once | SUBCUTANEOUS | Status: AC
  Administered 2019-10-17: 35 [IU] via SUBCUTANEOUS

## 2019-10-17 NOTE — Progress Notes (Signed)
PROGRESS NOTE                                                                                                                                                                                                             Patient Demographics:    William [name], is a 63 y.o. male, DOB - Jan 04, 1956, [id number]  Outpatient Primary MD for the patient is [name], MD   Admit date - 10/05/2019   LOS - 18  Chief Complaint  Patient presents with   COVID positive   Shortness of Breath       Brief Narrative:  Patient is a 63 y.o. male with PMHx of HTN, dyslipidemia, DM-2, CAD-who presented with several days duration of shortness of breath and cough-he was found to have acute hypoxic respiratory failure secondary to COVID-19 pneumonia.  Significant events:  11/16>> admit to [place] 11/17>> transferred to ICU for worsening hypoxemia requiring heated high flow 11/20>> transferred back to PCU  COVID-19 Medications:  Steroids:11/16>> Remdesivir:11/15>>11/19 Actemra:11/17>>x 1 Convalescent Plasma:11/17>>x1   Subjective:   Patient in bed, appears comfortable, denies any headache, no fever, no chest pain or pressure, improving but +ve shortness of breath , no abdominal pain. No focal weakness.   Assessment  & Plan :   Severe Acute Hypoxic Resp Failure due to Covid 19 Viral pneumonia: has severe pulmonary disease, he has been treated so far with steroids, remdesivir, convalescent plasma along with Actemra, he was treated in ICU and stabilized and transferred to my care on 10/09/2019 on combination of high flow oxygen and nonrebreather mask.  Patient was symptomatic for several days with extreme shortness of breath before he presented to the hospital and I think he has incurred severe parenchymal injury due to late presentation.  He is still extremely hypoxic and still requiring both high flow oxygen and nonrebreather mask.   Steroids being tapered as inflammatory markers are improving, his hypoxia has improved on 10/16/2019 once his pulse ox probe was switched to his earlobe, currently on high flow 15 L nasal cannula oxygen plus nonrebreather but likely will come off of normal results soon if he continues to improve but still overall tenuous, continue Lasix for diuresis, in case he continues to remain very tenuous he might require getting transferred back to ICU if he does not turn around.  Updated wife again on  the 28th about his poor prognosis.  Case also discussed with pulmonary physician Dr. Lake Bells on 24th.  If he gets any worse he might require being transferred back to ICU.  Was encouraged to sit up in chair in the daytime use I-S and flutter valve for pulmonary toiletry and then prone at bed at night.  SpO2: 91 % O2 Flow Rate (L/min): 15 L/min FiO2 (%): 60 %   COVID-19 Labs: Recent Labs    10/15/19 0203 10/16/19 0355 10/17/19 0224  DDIMER 8.84* 7.41* 3.66*  FERRITIN 259  --   --   LDH 659*  --   --   CRP <0.8 <0.8 <0.8    Lab Results  Component Value Date   SARSCOV2NAA POSITIVE (A) 09/22/2019     AKI: Improved-suspect may have developed some mild CKD stage III , renal function seems to have stabilized with a creatinine of around 1.4.  Acute on Chronic diastolic heart failure EF 60% on recent echo: Appears to be in fluid overload, continue IV Lasix, Zaroxolyn added on 10/16/2019, Coreg dose increased on 10/16/2019.  HTN: Appears to be poorly controlled, currently on Coreg and Norvasc, Coreg dose increased on 10/16/2019.  Dyslipidemia: Continue statin  History of CAD: No anginal symptoms-continue dual antiplatelet agents, statin and metoprolol.  Remote history of DVT: ++ D dimer , negative CT and lower extremity venous duplex.  Improving with high-dose Lovenox continue.  Obesity: BMI 35.  Follow with PCP for weight loss.   Leukocytosis - CXR better, procalcitonin stable, likely steroid  related, will monitor.  DM-2 (A1c 8.1 on 09/28/2019): Poor outpatient control due to hyperglycemia.  On Lantus sliding scale and premeal NovoLog. Insulin dose reduced on 10/16/2019.  Extremely erratic sugars, extremely labile.  Be cautious with control.  CBG (last 3)  Recent Labs    10/16/19 2340 10/17/19 0349 10/17/19 0806  GLUCAP 314* 194* 297*     GI prophylaxis: H2 Blocker  Consults  :  PCCM  Procedures  :    CTA and Leg [place] - No Clots  TTE -   1. Left ventricular ejection fraction, by visual estimation, is 60 to 65%. The left ventricle has normal function. There is moderately increased left ventricular hypertrophy.  2. Left ventricular diastolic parameters are consistent with Grade I diastolic dysfunction (impaired relaxation).  3. Global right ventricle has normal systolic function.The right ventricular size is normal.  4. Right atrial size was normal.  5. The mitral valve is normal in structure. No evidence of mitral valve regurgitation. No evidence of mitral stenosis.  6. The tricuspid valve is normal in structure. Tricuspid valve regurgitation is trivial.  7. The aortic valve is normal in structure. Aortic valve regurgitation is not visualized. No evidence of aortic valve sclerosis or stenosis.  8. The pulmonic valve was normal in structure. Pulmonic valve regurgitation is not visualized.  9. Mildly elevated pulmonary artery systolic pressure. 10. The inferior vena cava is normal in size with greater than 50% respiratory variability, suggesting right atrial pressure of 3 mmHg. 11. Normal LV systolic function; moderate LVH; grade 1 diastolic dysfunction.   Condition - Extremely Guarded  Family Communication  : spouse on 11/21, 10/10/19, 11/28, 11/29, 11/30 ( wife + daughter)  Code Status :  No Intubation - confirmed with patient and wife on 10/16/2019.  Diet :   Diet Order            Diet Carb Modified Fluid consistency: Thin; Room service appropriate? Yes  Diet  effective  now               Disposition Plan  :  Remain hospitalized-suspect can be transferred to PCU later today if he remains stable on regular high flow.  Barriers to discharge: Hypoxia requiring O2 supplementation/complete 5 days of IV Remdesivir  Antimicorbials  :    Anti-infectives (From admission, onward)   Start     Dose/Rate Route Frequency Ordered Stop   10/04/19 1600  remdesivir 100 mg in sodium chloride 0.9 % 250 mL IVPB     100 mg 500 mL/hr over 30 Minutes Intravenous Daily 09/26/2019 2358 10/06/19 1140   10/07/2019 2200  remdesivir 100 mg in sodium chloride 0.9 % 250 mL IVPB  Status:  Discontinued     100 mg 500 mL/hr over 30 Minutes Intravenous Every 24 hours 09/24/2019 1537 09/24/2019 2358     DVT Prophylaxis  :  Lovenox    Inpatient Medications  Scheduled Meds:  amLODipine  10 mg Oral Daily   aspirin EC  81 mg Oral Daily   atorvastatin  40 mg Oral q1800   carvedilol  12.5 mg Oral BID WC   Chlorhexidine Gluconate Cloth  6 each Topical Daily   dextrose  25 mL Intravenous Once   enoxaparin (LOVENOX) injection  1 mg/kg Subcutaneous Q12H   famotidine  20 mg Oral Daily   feeding supplement (PRO-STAT SUGAR FREE 64)  30 mL Oral TID WC   furosemide  40 mg Intravenous Once   insulin aspart  0-5 Units Subcutaneous QHS   insulin aspart  0-9 Units Subcutaneous TID WC   insulin aspart  35 Units Subcutaneous Once   insulin detemir  9 Units Subcutaneous QHS   Ipratropium-Albuterol  2 puff Inhalation Q6H   methylPREDNISolone (SOLU-MEDROL) injection  30 mg Intravenous Daily   multivitamin with minerals  1 tablet Oral Daily   ticagrelor  90 mg Oral BID   vitamin B-12  100 mcg Oral Daily   vitamin C  500 mg Oral Daily   zinc sulfate  220 mg Oral Daily   Continuous Infusions:  PRN Meds:.acetaminophen, lip balm, nitroGLYCERIN, [DISCONTINUED] ondansetron **OR** ondansetron (ZOFRAN) IV, sodium chloride, tetrahydrozoline  Time Spent in minutes  25   See  all Orders from today for further details  Lala Lund M.D on 10/17/2019 at 11:55 AM  To page go to [email or web address] - use universal password  [facility] -  Office  [phone]    Objective:   Vitals:   10/17/19 0005 10/17/19 0354 10/17/19 0545 10/17/19 0718  BP:  139/78  (!) 164/66  Pulse:  72  75  Resp:  18    Temp:  98.2 F (36.8 C)  98.5 F (36.9 C)  TempSrc:  Oral  Oral  SpO2: 90% 91%  91%  Weight:   88.2 kg   Height:        Wt Readings from Last 3 Encounters:  10/17/19 88.2 kg  10/04/2019 102.1 kg  03/30/14 97.5 kg     Intake/Output Summary (Last 24 hours) at 10/17/2019 1155 Last data filed at 10/17/2019 0920 Gross per 24 hour  Intake 437 ml  Output 3575 ml  Net -3138 ml     Physical Exam  Awake Alert, Oriented X 3, No new F.N deficits, Normal affect [place].AT,PERRAL Supple Neck,No JVD, No cervical lymphadenopathy appriciated.  Symmetrical Chest wall movement, Good air movement bilaterally, few rales RRR,No Gallops, Rubs or new Murmurs, No Parasternal Heave +ve B.Sounds, Abd Soft, No tenderness, No organomegaly  appriciated, No rebound - guarding or rigidity. No Cyanosis, Clubbing or edema, No new Rash or bruise    Data Review:    CBC  Recent Labs  Lab 10/13/19 0352 10/14/19 0320 10/15/19 0203 10/16/19 0355 10/17/19 0224  WBC 14.2* 15.9* 17.6* 14.5* 13.2*  HGB 12.3* 13.0 13.0 13.0 12.6*  HCT 38.5* 40.9 41.4 40.9 39.3  PLT 464* 474* 470* 451* 426*  MCV 78.1* 78.5* 79.2* 78.7* 78.0*  MCH 24.9* 25.0* 24.9* 25.0* 25.0*  MCHC 31.9 31.8 31.4 31.8 32.1  RDW 14.6 14.8 15.0 15.1 15.0  LYMPHSABS 0.3* 0.4* 0.3* 0.6* 0.5*  MONOABS 0.2 0.2 0.2 0.3 0.3  EOSABS 0.1 0.0 0.1 0.2 0.2  BASOSABS 0.0 0.0 0.0 0.0 0.0    Chemistries   Recent Labs  Lab 10/13/19 0352 10/14/19 0320 10/15/19 0203 10/16/19 0355 10/17/19 0224  NA 141 142 143 140 141  K 5.0 5.1 4.9 4.9 4.5  CL 108 108 108 107 104  CO2 24 24 25 25 26   GLUCOSE 126* 77 62* 44* 214*    BUN 45* 33* 38* 48* 68*  CREATININE 1.32* 1.16 1.31* 1.25* 1.43*  CALCIUM 8.7* 8.8* 8.7* 8.6* 8.9  MG 2.7* 2.6* 2.8* 2.5* 2.8*  AST 40 44* 40 39 33  ALT 43 48* 51* 51* 56*  ALKPHOS 187* 183* 153* 127* 127*  BILITOT 0.4 0.5 0.7 0.9 0.6   ------------------------------------------------------------------------------------------------------------------ No results for input(s): CHOL, HDL, LDLCALC, TRIG, CHOLHDL, LDLDIRECT in the last 72 hours.  Lab Results  Component Value Date   HGBA1C 8.1 (H) 10/12/2019   ------------------------------------------------------------------------------------------------------------------ No results for input(s): TSH, T4TOTAL, T3FREE, THYROIDAB in the last 72 hours.  Invalid input(s): FREET3 ------------------------------------------------------------------------------------------------------------------  Micro Results No results found for this or any previous visit (from the past 240 hour(s)).  Radiology Reports [place]  Result Date: 10/09/2019 CLINICAL DATA:  COVID pneumonia, shortness of breath, concern for PE EXAM: CT ANGIOGRAPHY CHEST WITH CONTRAST TECHNIQUE: Multidetector CT imaging of the chest was performed using the standard protocol during bolus administration of intravenous contrast. Multiplanar CT image reconstructions and MIPs were obtained to evaluate the vascular anatomy. CONTRAST:  72mL OMNIPAQUE IOHEXOL 350 MG/ML SOLN COMPARISON:  None. FINDINGS: Cardiovascular: No significant filling defect or pulmonary embolus demonstrated by CTA. Pulmonary arteries appear patent. Limited assessment of the lower lobes with motion artifact from breathing. Minor aortic atherosclerosis. Negative for aneurysm or dissection. Patent two-vessel arch anatomy. No mediastinal hemorrhage or hematoma. Native coronary atherosclerosis noted. Heart size without pericardial effusion Mediastinum/Nodes: Prominent thyroid noted extending substernal. Trachea and esophagus  unremarkable. Esophagus is nondilated. Mildly prominent prevascular, hilar and subcarinal lymph nodes, suspect reactive/inflammatory. No bulky adenopathy. Lungs/Pleura: Extensive diffuse and patchy ground-glass opacities throughout all lobes of both lungs with some sparing of the lung apices. There is associated bibasilar consolidation/atelectasis with central air bronchograms. Appearance compatible with atypical pneumonia/viral pneumonia as seen with COVID-19. No significant effusion. No other pleural abnormality or pneumothorax. Trachea and central airways remain patent. No mucous plugging or obstruction. Upper Abdomen: Fatty infiltration of the liver suspected. No acute finding in the upper abdomen. Musculoskeletal: No chest wall abnormality. No acute or significant osseous findings. Review of the MIP images confirms the above findings. IMPRESSION: 1. Negative for significant acute pulmonary embolus by CTA. 2. Extensive diffuse and patchy ground-glass opacities throughout all lobes of both lungs with some sparing of the lung apices. Findings compatible with atypical pneumonia/viral pneumonia as seen with COVID-19. Aortic Atherosclerosis (ICD10-I70.0). Electronically Signed   By: Jerilynn Mages.  Shick M.D.   On: 10/09/2019 09:23   Dg Chest Port 1 View  Result Date: 10/15/2019 CLINICAL DATA:  COVID-19 positive, shortness of breath. EXAM: PORTABLE CHEST 1 VIEW COMPARISON:  Chest x-rays dated 10/13/2019 and 10/04/2019. FINDINGS: Ground-glass opacities are again seen at the lung bases bilaterally, improved compared to the most recent chest x-ray of 10/13/2019, now similar to the earlier plain film of 10/04/2019. No pneumothorax seen. Heart size and mediastinal contours are stable. IMPRESSION: Bibasilar pneumonia, improved compared to most recent chest x-ray of 10/13/2019. Electronically Signed   By: Franki Cabot M.D.   On: 10/15/2019 08:20   Dg Chest Port 1 View  Result Date: 10/13/2019 CLINICAL DATA:  Shortness of  breath, COVID-19 EXAM: PORTABLE CHEST 1 VIEW COMPARISON:  CTA chest dated 10/09/2019 FINDINGS: Multifocal pneumonia with relative sparing of the left upper lobe. No definite pleural effusions. No pneumothorax. Cardiomegaly. IMPRESSION: Multifocal pneumonia in this patient with known COVID. Electronically Signed   By: Julian Hy M.D.   On: 10/13/2019 09:44   Dg Chest Port 1 View  Result Date: 10/04/2019 CLINICAL DATA:  Hypoxia EXAM: PORTABLE CHEST 1 VIEW COMPARISON:  09/28/2019 FINDINGS: Interstitial/patchy opacities in the bilateral upper and lower lobes, unchanged. No definite pleural effusions. No pneumothorax. The heart is top-normal in size. IMPRESSION: Multifocal pneumonia in this patient with known COVID, unchanged. Electronically Signed   By: Julian Hy M.D.   On: 10/04/2019 10:07   Dg Chest Portable 1 View  Result Date: 09/28/2019 CLINICAL DATA:  Hypoxia, shortness of breath. EXAM: PORTABLE CHEST 1 VIEW COMPARISON:  October 02, 2019. FINDINGS: The heart size and mediastinal contours are within normal limits. No pneumothorax or pleural effusion is noted. Stable left perihilar and lingular opacity is noted as well as mild right upper lobe opacity consistent with multifocal pneumonia. The visualized skeletal structures are unremarkable. IMPRESSION: Stable bilateral lung opacities are noted, left greater than right, consistent with multifocal pneumonia. Electronically Signed   By: Marijo Conception M.D.   On: 10/09/2019 14:14   Dg Chest Port 1 View  Result Date: 10/08/2019 CLINICAL DATA:  Pt complains of chest pain x 3 days, non-radiating, worse with inspiration, cough, fever, and generally feeling unwell. Pt was unable to hold deep inspiration for x-ray due to coughing. EXAM: PORTABLE CHEST 1 VIEW COMPARISON:  01/07/2014 FINDINGS: Hazy interstitial and airspace opacities present in the left perihilar region, right upper lobe, and left lower lobe. Low lung volumes are present, causing  crowding of the pulmonary vasculature. No blunting of the costophrenic angles. Cardiac and mediastinal margins appear normal. IMPRESSION: 1. Hazy interstitial and airspace opacities in the left perihilar region, right upper lobe, and left lower lobe, suspicious for multilobar pneumonia or atypical pneumonia. 2. Low lung volumes. Electronically Signed   By: Van Clines M.D.   On: 09/20/2019 18:57   Leg [place] [facility]  Result Date: 10/10/2019  Lower Venous Study Indications: Covid positive, elevated D-Dimer.  Comparison Study: Prior negative Bilateral LEV done 10/04/19 Performing Technologist: Sharion Dove RVS  Examination Guidelines: A complete evaluation includes B-mode imaging, spectral Doppler, color Doppler, and power Doppler as needed of all accessible portions of each vessel. Bilateral testing is considered an integral part of a complete examination. Limited examinations for reoccurring indications may be performed as noted.  +---------+---------------+---------+-----------+----------+--------------+  RIGHT     Compressibility Phasicity Spontaneity Properties Thrombus Aging  +---------+---------------+---------+-----------+----------+--------------+  CFV       Full  Yes       Yes                                    +---------+---------------+---------+-----------+----------+--------------+  SFJ       Full                                                             +---------+---------------+---------+-----------+----------+--------------+  FV Prox   Full                                                             +---------+---------------+---------+-----------+----------+--------------+  FV Mid    Full                                                             +---------+---------------+---------+-----------+----------+--------------+  FV Distal Full                                                             +---------+---------------+---------+-----------+----------+--------------+  PFV        Full                                                             +---------+---------------+---------+-----------+----------+--------------+  POP       Full            Yes       Yes                                    +---------+---------------+---------+-----------+----------+--------------+  PTV       Full                                                             +---------+---------------+---------+-----------+----------+--------------+  PERO      Full                                                             +---------+---------------+---------+-----------+----------+--------------+  Gastroc   None  Acute           +---------+---------------+---------+-----------+----------+--------------+   +---------+---------------+---------+-----------+----------+--------------+  LEFT      Compressibility Phasicity Spontaneity Properties Thrombus Aging  +---------+---------------+---------+-----------+----------+--------------+  CFV       Full            Yes       Yes                                    +---------+---------------+---------+-----------+----------+--------------+  SFJ       Full                                                             +---------+---------------+---------+-----------+----------+--------------+  FV Prox   Full                                                             +---------+---------------+---------+-----------+----------+--------------+  FV Mid    Full                                                             +---------+---------------+---------+-----------+----------+--------------+  FV Distal Full                                                             +---------+---------------+---------+-----------+----------+--------------+  PFV       Full                                                             +---------+---------------+---------+-----------+----------+--------------+  POP       Full            Yes       Yes                                     +---------+---------------+---------+-----------+----------+--------------+  PTV       Full                                                             +---------+---------------+---------+-----------+----------+--------------+  PERO      Full                                                             +---------+---------------+---------+-----------+----------+--------------+  Gastroc   None                                             Acute           +---------+---------------+---------+-----------+----------+--------------+     Summary: Right: Findings consistent with acute deep vein thrombosis involving the right gastrocnemius veins. Left: Findings consistent with acute deep vein thrombosis involving the left gastrocnemius veins.  *See table(s) above for measurements and observations. Electronically signed by Curt Jews MD on 10/10/2019 at 1:39:57 PM.    Final    [place] Ven[place]  Result Date: 10/04/2019  Lower Venous Study Indications: Covid-19 positive, and Swelling.  Anticoagulation: Lovenox. Comparison Study: No priors Performing Technologist: Velva Harman Sturdivant RDMS, RVT  Examination Guidelines: A complete evaluation includes B-mode imaging, spectral Doppler, color Doppler, and power Doppler as needed of all accessible portions of each vessel. Bilateral testing is considered an integral part of a complete examination. Limited examinations for reoccurring indications may be performed as noted.  +---------+---------------+---------+-----------+----------+--------------+  RIGHT     Compressibility Phasicity Spontaneity Properties Thrombus Aging  +---------+---------------+---------+-----------+----------+--------------+  CFV       Full            Yes       Yes                                    +---------+---------------+---------+-----------+----------+--------------+  SFJ       Full                                                              +---------+---------------+---------+-----------+----------+--------------+  FV Prox   Full                                                             +---------+---------------+---------+-----------+----------+--------------+  FV Mid    Full                                                             +---------+---------------+---------+-----------+----------+--------------+  FV Distal Full                                                             +---------+---------------+---------+-----------+----------+--------------+  PFV       Full                                                             +---------+---------------+---------+-----------+----------+--------------+  POP       Full            No        Yes                                    +---------+---------------+---------+-----------+----------+--------------+  PTV       Full                                                             +---------+---------------+---------+-----------+----------+--------------+  PERO      Full                                                             +---------+---------------+---------+-----------+----------+--------------+   +---------+---------------+---------+-----------+----------+--------------+  LEFT      Compressibility Phasicity Spontaneity Properties Thrombus Aging  +---------+---------------+---------+-----------+----------+--------------+  CFV       Full            Yes       Yes                                    +---------+---------------+---------+-----------+----------+--------------+  SFJ       Full                                                             +---------+---------------+---------+-----------+----------+--------------+  FV Prox   Full                                                             +---------+---------------+---------+-----------+----------+--------------+  FV Mid    Full                                                              +---------+---------------+---------+-----------+----------+--------------+  FV Distal Full                                                             +---------+---------------+---------+-----------+----------+--------------+  PFV       Full                                                             +---------+---------------+---------+-----------+----------+--------------+  POP       Full            Yes       Yes                                    +---------+---------------+---------+-----------+----------+--------------+  PTV       Full                                                             +---------+---------------+---------+-----------+----------+--------------+  PERO      Full                                                             +---------+---------------+---------+-----------+----------+--------------+     Summary: Right: There is no evidence of deep vein thrombosis in the lower extremity. No cystic structure found in the popliteal fossa. Left: There is no evidence of deep vein thrombosis in the lower extremity. No cystic structure found in the popliteal fossa.  *See table(s) above for measurements and observations. Electronically signed by Ruta Hinds MD on 10/04/2019 at 3:31:58 PM.    Final

## 2019-10-17 NOTE — Plan of Care (Signed)
  Problem: Respiratory: Goal: Will maintain a patent airway Outcome: Progressing Goal: Complications related to the disease process, condition or treatment will be avoided or minimized Outcome: Progressing   

## 2019-10-17 NOTE — Progress Notes (Signed)
Inpatient Diabetes Program Recommendations  AACE/ADA: New Consensus Statement on Inpatient Glycemic Control (2015)  Target Ranges:  Prepandial:   less than 140 mg/dL      Peak postprandial:   less than 180 mg/dL (1-2 hours)      Critically ill patients:  140 - 180 mg/dL   Lab Results  Component Value Date   [id number] (HH) 10/17/2019   HGBA1C 8.1 (H) 09/27/2019    Review of Glycemic Control Results for William [name], William [name] (MRN [id number]) as of 10/17/2019 14:44  Ref. Range 10/16/2019 21:31 10/16/2019 23:40 10/17/2019 03:49 10/17/2019 08:06 10/17/2019 11:41  Glucose-Capillary Latest Ref Range: 70 - 99 mg/dL 283 (H) 314 (H) 194 (H) 297 (H) 525 (HH)   Diabetes history: DM 2 Outpatient Diabetes medications:  Jardiance 25 mg daily, Linagliptin/metformin 2.03/999 mg daily Current orders for Inpatient glycemic control:  Solumedrol 30 mg daily Levemir 9 units daily Novolog sensitive tid with meals and HS  Inpatient Diabetes Program Recommendations:    Note patient received nutritional supplement this AM along with IV steroids.  Blood sugar up too 525 mg/dL and MD ordered Novolog 35 units x1.   Monitor closely for S&S of low blood sugars.    Consider adding Novolog meal coverage 3 units tid with meals (hold if patient eats less than 50%).    Thanks,  Adah Perl, RN, BC-ADM Inpatient Diabetes Coordinator Pager [phone] (8a-5p)

## 2019-10-17 NOTE — Progress Notes (Signed)
Occupational Therapy Treatment Patient Details Name: William [name] MRN: [id number] DOB: 1956-05-14 Today's Date: 10/17/2019    History of present illness Patient is a 63 y.o. male with PMHx of HTN, dyslipidemia, DM-2, CAD-who presented with shortness of breath and cough-he was found to have acute hypoxic respiratory failure secondary to COVID-19 pneumonia.   OT comments  Pt progressing towards established OT goals. Continues to present with significantly poor activity tolerance as seen by fatigue, SOB, and decreased SpO2. Despite SOB and fatigue, pt motivated to participate in therapy. Upon arrival, pt Spo2 at 74% on 6L via NRB and 15L via HFNC. Placed pt on 15L for both NRB and HFNC and he slowly returned to low 90s while seated in recliner. Pt performing sit<>stand x3 and focused on standing tolerance. Pt SpO2 fluctuating between 80s-low 90s on 15L via NRB and 10L via HFNC. Providing cues for calm, purse lip breathing throughout. Continue to recommend dc to CIR and will continue to follow acutely as admitted.    Follow Up Recommendations  CIR;Supervision/Assistance - 24 hour    Equipment Recommendations  Other (comment);Tub/shower seat(TBD)    Recommendations for Other Services      Precautions / Restrictions Precautions Precautions: Fall;Other (comment) Precaution Comments: sats drop quickly Restrictions Weight Bearing Restrictions: No       Mobility Bed Mobility               General bed mobility comments: Sitting in recliner  Transfers Overall transfer level: Needs assistance Equipment used: None Transfers: Sit to/from Stand;Stand Pivot Transfers Sit to Stand: Min guard         General transfer comment: Min Guard A for safety    Balance Overall balance assessment: Mild deficits observed, not formally tested                                         ADL either performed or assessed with clinical judgement   ADL Overall ADL's : Needs  assistance/impaired Eating/Feeding: Modified independent;Sitting Eating/Feeding Details (indicate cue type and reason): While pt drinking his SpO2 dropping to 84%. Cued pt for purse lip breathing.                      Toilet Transfer: Min guard(sit<>stand) [employer] forces technical [employer] Details (indicate cue type and reason): Min Guard A for safety with sit<>stand from recliner         Functional mobility during ADLs: Min guard(very short distance) General ADL Comments: Pt continues to present with significantly poor activity tolerance. Dropping to low 80s with drinking and dropping to 74% with sit<>stand. Focused session on sit<>Stand and standing tolerance while performing calm, purse lip breathing     Vision       Perception     Praxis      Cognition Arousal/Alertness: Awake/alert Behavior During Therapy: Anxious Overall Cognitive Status: Within Functional Limits for tasks assessed                                 General Comments: Benefits from calm cues for breathing        Exercises     Shoulder Instructions       General Comments Upon arrival, pt on 6L via NRB and 15L via HFNC and Spo2 at 74%. Placed pt on 15L for both NRB and HFNC and  he slowly returned to low 90s while seated in recliner. During session, focused on purse lip breathing. Pt performing sit<>Stand and standing tolerance on 15L via NRB and 10L via HFNC; pt able to maintain SpO2 in 90s; dropping to high 70s when seated but quickly recovers with cues for breathing    Pertinent Vitals/ Pain       Pain Assessment: No/denies pain  Home Living                                          Prior Functioning/Environment              Frequency  Min 2X/week        Progress Toward Goals  OT Goals(current goals can now be found in the care plan section)  Progress towards OT goals: Progressing toward goals  Acute Rehab OT Goals Patient Stated Goal: to be able to walk in the  park OT Goal Formulation: With patient Time For Goal Achievement: 10/24/19 Potential to Achieve Goals: Good ADL Goals Pt Will Perform Grooming: with modified independence;standing Pt Will Perform Lower Body Bathing: with modified independence;sit to/from stand Pt Will Perform Lower Body Dressing: with modified independence;sit to/from stand Pt Will Transfer to Toilet: with modified independence;ambulating Pt Will Perform Toileting - Clothing Manipulation and hygiene: with modified independence;sit to/from stand Pt/caregiver will Perform Home Exercise Program: Increased strength;Both right and left upper extremity;With written HEP provided;With theraband Additional ADL Goal #1: Pt will tolerate >34min seated/standing activity at mod independent level with VSS.  Plan Discharge plan remains appropriate    Co-evaluation                 AM-PAC OT "6 Clicks" Daily Activity     Outcome Measure   Help from another person eating meals?: None Help from another person taking care of personal grooming?: None Help from another person toileting, which includes using toliet, bedpan, or urinal?: A Little Help from another person bathing (including washing, rinsing, drying)?: A Little Help from another person to put on and taking off regular upper body clothing?: A Little Help from another person to put on and taking off regular lower body clothing?: A Little 6 Click Score: 20    End of Session Equipment Utilized During Treatment: Oxygen(15L via NRB and 10L via HFNC)  OT Visit Diagnosis: Other (comment);Muscle weakness (generalized) (M62.81)(decreased activity tolerance)   Activity Tolerance Patient tolerated treatment well   Patient Left in chair;with call bell/phone within reach   Nurse Communication Mobility status        Time: [id number] OT Time Calculation (min): 35 min  Charges: OT General Charges $OT Visit: 1 Visit OT Treatments $Self Care/Home Management : 8-22  mins $Therapeutic Activity: 8-22 mins  [place], OTR/L Acute Rehab Pager: [phone] Office: [place] 10/17/2019, 5:16 PM

## 2019-10-18 LAB — CBC WITH DIFFERENTIAL/PLATELET
Abs Immature Granulocytes: 0.06 10*3/uL (ref 0.00–0.07)
Basophils Absolute: 0 10*3/uL (ref 0.0–0.1)
Basophils Relative: 0 %
Eosinophils Absolute: 0.2 10*3/uL (ref 0.0–0.5)
Eosinophils Relative: 2 %
HCT: 39.7 % (ref 39.0–52.0)
Hemoglobin: 12.6 g/dL — ABNORMAL LOW (ref 13.0–17.0)
Immature Granulocytes: 0 %
Lymphocytes Relative: 4 %
Lymphs Abs: 0.6 10*3/uL — ABNORMAL LOW (ref 0.7–4.0)
MCH: 25 pg — ABNORMAL LOW (ref 26.0–34.0)
MCHC: 31.7 g/dL (ref 30.0–36.0)
MCV: 78.9 fL — ABNORMAL LOW (ref 80.0–100.0)
Monocytes Absolute: 0.2 10*3/uL (ref 0.1–1.0)
Monocytes Relative: 2 %
Neutro Abs: 13.3 10*3/uL — ABNORMAL HIGH (ref 1.7–7.7)
Neutrophils Relative %: 92 %
Platelets: 411 10*3/uL — ABNORMAL HIGH (ref 150–400)
RBC: 5.03 MIL/uL (ref 4.22–5.81)
RDW: 15.2 % (ref 11.5–15.5)
WBC: 14.5 10*3/uL — ABNORMAL HIGH (ref 4.0–10.5)
nRBC: 0 % (ref 0.0–0.2)

## 2019-10-18 LAB — COMPREHENSIVE METABOLIC PANEL
ALT: 55 U/L — ABNORMAL HIGH (ref 0–44)
AST: 37 U/L (ref 15–41)
Albumin: 2.7 g/dL — ABNORMAL LOW (ref 3.5–5.0)
Alkaline Phosphatase: 129 U/L — ABNORMAL HIGH (ref 38–126)
Anion gap: 13 (ref 5–15)
BUN: 80 mg/dL — ABNORMAL HIGH (ref 8–23)
CO2: 25 mmol/L (ref 22–32)
Calcium: 9.1 mg/dL (ref 8.9–10.3)
Chloride: 102 mmol/L (ref 98–111)
Creatinine, Ser: 1.62 mg/dL — ABNORMAL HIGH (ref 0.61–1.24)
GFR calc Af Amer: 52 mL/min — ABNORMAL LOW (ref >60)
GFR calc non Af Amer: 45 mL/min — ABNORMAL LOW (ref >60)
Glucose, Bld: 180 mg/dL — ABNORMAL HIGH (ref 70–99)
Potassium: 4.3 mmol/L (ref 3.5–5.1)
Sodium: 140 mmol/L (ref 135–145)
Total Bilirubin: 0.3 mg/dL (ref 0.3–1.2)
Total Protein: 6.6 g/dL (ref 6.5–8.1)

## 2019-10-18 LAB — GLUCOSE, CAPILLARY
Glucose-Capillary: 234 mg/dL — ABNORMAL HIGH (ref 70–99)
Glucose-Capillary: 342 mg/dL — ABNORMAL HIGH (ref 70–99)
Glucose-Capillary: 220 mg/dL — ABNORMAL HIGH (ref 70–99)
Glucose-Capillary: 100 mg/dL — ABNORMAL HIGH (ref 70–99)

## 2019-10-18 LAB — MAGNESIUM: Magnesium: 2.9 mg/dL — ABNORMAL HIGH (ref 1.7–2.4)

## 2019-10-18 LAB — D-DIMER, QUANTITATIVE: D-Dimer, Quant: 2.64 ug/mL-FEU — ABNORMAL HIGH (ref 0.00–0.50)

## 2019-10-18 LAB — BRAIN NATRIURETIC PEPTIDE: B Natriuretic Peptide: 70 pg/mL (ref 0.0–100.0)

## 2019-10-18 LAB — C-REACTIVE PROTEIN: CRP: <0.8 mg/dL (ref <1.0)

## 2019-10-18 LAB — PROCALCITONIN: Procalcitonin: 0.27 ng/mL

## 2019-10-18 MED ORDER — METHYLPREDNISOLONE SODIUM SUCC 40 MG IJ SOLR
5.0000 mg | Freq: Every day | INTRAMUSCULAR | Status: DC
  Administered 2019-10-19 – 2019-10-20 (×2): 5.2 mg via INTRAVENOUS
  Filled 2019-10-18 (×2): qty 1

## 2019-10-18 MED ORDER — LACTATED RINGERS IV SOLN
INTRAVENOUS | Status: AC
  Administered 2019-10-18: 08:00:00 via INTRAVENOUS

## 2019-10-18 MED ORDER — INSULIN DETEMIR 100 UNIT/ML ~~LOC~~ SOLN
20.0000 [IU] | Freq: Every day | SUBCUTANEOUS | Status: DC
  Administered 2019-10-18 – 2019-10-25 (×8): 20 [IU] via SUBCUTANEOUS
  Filled 2019-10-18 (×8): qty 0.2

## 2019-10-18 MED ORDER — METHYLPREDNISOLONE SODIUM SUCC 40 MG IJ SOLR
10.0000 mg | Freq: Every day | INTRAMUSCULAR | Status: DC
  Administered 2019-10-18: 10 mg via INTRAVENOUS
  Filled 2019-10-18: qty 1

## 2019-10-18 NOTE — Progress Notes (Addendum)
PROGRESS NOTE                                                                                                                                                                                                             Patient Demographics:    William [name], is a 63 y.o. male, DOB - January 31, 1956, [id number]  Outpatient Primary MD for the patient is [name], MD   Admit date - 10/01/2019   LOS - 67  Chief Complaint  Patient presents with   COVID positive   Shortness of Breath       Brief Narrative:  Patient is a 63 y.o. male with PMHx of HTN, dyslipidemia, DM-2, CAD-who presented with several days duration of shortness of breath and cough-he was found to have acute hypoxic respiratory failure secondary to COVID-19 pneumonia.  He was sick at home for several days before he presented to the hospital per wife at least 5 to 6 days, I suspect he incurred severe parenchymal injury before appropriate treatment was commenced due to delayed presentation by the patient.  He has finished his Covid specific treatment but he is still quite hypoxic, I suspect it will take several days before he turns around as his injury is quite significant.  Significant events:  11/16>> admit to [facility] 11/17>> transferred to ICU for worsening hypoxemia requiring heated high flow 11/20>> transferred back to PCU  COVID-19 Medications:  Steroids:11/16>> Remdesivir:11/15>>11/19 Actemra:11/17>>x 1 Convalescent Plasma:11/17>>x1   Subjective:   Patient sitting in chair, denies any headache chest or abdominal pain.SOB improving.   Assessment  & Plan :   Severe Acute Hypoxic Resp Failure due to Covid 19 Viral pneumonia: has severe pulmonary disease, he has been treated so far with steroids, remdesivir, convalescent plasma along with Actemra, he was treated in ICU and stabilized and transferred to my care on 10/09/2019 on combination of high  flow oxygen and nonrebreather mask.  Patient was symptomatic for several days with extreme shortness of breath before he presented to the hospital and I think he has incurred severe parenchymal injury due to late presentation.  He is still extremely hypoxic and still requiring both high flow oxygen and nonrebreather mask.  Steroids being tapered off as inflammatory markers are improving, his hypoxia has improved on 10/16/2019 once his pulse ox probe was switched to his earlobe, currently on  high flow 15 L nasal cannula oxygen plus nonrebreather but likely will come off of normal results soon if he continues to improve but still overall tenuous, continue Lasix for diuresis PRN, clinically is improved marginally on a daily basis, he continues to remain very tenuous he might require getting transferred back to ICU if he does not turn around.  Updated wife daily about his continued poor prognosis.    Case also discussed with pulmonary physician Dr. Lake Bells on 24th.  If he gets any worse he might require being transferred back to ICU.  Was encouraged to sit up in chair in the daytime use I-S and flutter valve for pulmonary toiletry and then prone at bed at night.  SpO2: 92 % O2 Flow Rate (L/min): 15 L/min FiO2 (%): 60 %   COVID-19 Labs: Recent Labs    10/16/19 0355 10/17/19 0224 10/18/19 0210  DDIMER 7.41* 3.66* 2.64*  CRP <0.8 <0.8 <0.8    Lab Results  Component Value Date   SARSCOV2NAA POSITIVE (A) 09/21/2019     AKI: Improved-suspect may have developed some mild CKD stage III , renal function somewhat worsened on 10/18/2019, hold further diuresis and hydrate with IV fluids gently and monitor.  Acute on Chronic diastolic heart failure EF 60% on recent echo: Was diuresed earlier now compensated, Coreg dose increased on 10/16/2019.  HTN: Appears to be poorly controlled, currently on Coreg and Norvasc, Coreg dose increased on 10/16/2019.  Dyslipidemia: Continue statin  History of CAD: No  anginal symptoms-continue dual antiplatelet agents, statin and metoprolol.  Remote history of DVT: With possibly acute bilateral lower extremity DVT.  Since D-dimer was elevated venous ultrasound was repeated with positive clots.  On full dose Lovenox for now with downtrending D-dimer.   Obesity: BMI 35.  Follow with PCP for weight loss.   Leukocytosis - CXR better, procalcitonin stable, likely steroid related, will monitor.  DM-2 (A1c 8.1 on 10/10/2019): Poor outpatient control due to hyperglycemia.  On Lantus sliding scale and premeal NovoLog. Insulin dose reduced on 10/16/2019.  Extremely erratic sugars, extremely labile.  Be cautious with control.  CBG (last 3)  Recent Labs    10/17/19 2149 10/18/19 0715 10/18/19 1149  GLUCAP 123* 234* 342*     GI prophylaxis: H2 Blocker  Consults  :  PCCM  Procedures  :    CTA and Leg [place] - No Clots  Repeat lower extremity venous duplex.   Right: Findings consistent with acute deep vein thrombosis involving the right gastrocnemius veins. Left: Findings consistent with acute deep vein thrombosis involving the left gastrocnemius veins.   TTE -   1. Left ventricular ejection fraction, by visual estimation, is 60 to 65%. The left ventricle has normal function. There is moderately increased left ventricular hypertrophy.  2. Left ventricular diastolic parameters are consistent with Grade I diastolic dysfunction (impaired relaxation).  3. Global right ventricle has normal systolic function.The right ventricular size is normal.  4. Right atrial size was normal.  5. The mitral valve is normal in structure. No evidence of mitral valve regurgitation. No evidence of mitral stenosis.  6. The tricuspid valve is normal in structure. Tricuspid valve regurgitation is trivial.  7. The aortic valve is normal in structure. Aortic valve regurgitation is not visualized. No evidence of aortic valve sclerosis or stenosis.  8. The pulmonic valve was normal in  structure. Pulmonic valve regurgitation is not visualized.  9. Mildly elevated pulmonary artery systolic pressure. 10. The inferior vena cava is normal in size  with greater than 50% respiratory variability, suggesting right atrial pressure of 3 mmHg. 11. Normal LV systolic function; moderate LVH; grade 1 diastolic dysfunction.   Condition - Extremely Guarded  Family Communication  : spouse on 11/21, 10/10/19, 11/28, 11/29, 11/30 ( wife + daughter), wife and daughter again on 10/18/2019 at 12:10 AM.  Code Status :  No Intubation - confirmed with patient and wife on 10/16/2019.  Diet :   Diet Order            Diet Carb Modified Fluid consistency: Thin; Room service appropriate? Yes  Diet effective now               Disposition Plan  :  Remain hospitalized-suspect can be transferred to PCU later today if he remains stable on regular high flow.  Barriers to discharge: Hypoxia requiring O2 supplementation/complete 5 days of IV Remdesivir  Antimicorbials  :    Anti-infectives (From admission, onward)   Start     Dose/Rate Route Frequency Ordered Stop   10/04/19 1600  remdesivir 100 mg in sodium chloride 0.9 % 250 mL IVPB     100 mg 500 mL/hr over 30 Minutes Intravenous Daily 09/30/2019 2358 10/06/19 1140   09/18/2019 2200  remdesivir 100 mg in sodium chloride 0.9 % 250 mL IVPB  Status:  Discontinued     100 mg 500 mL/hr over 30 Minutes Intravenous Every 24 hours 10/17/2019 1537 10/10/2019 2358     DVT Prophylaxis  :  Lovenox    Inpatient Medications  Scheduled Meds:  amLODipine  10 mg Oral Daily   aspirin EC  81 mg Oral Daily   atorvastatin  40 mg Oral q1800   carvedilol  12.5 mg Oral BID WC   Chlorhexidine Gluconate Cloth  6 each Topical Daily   dextrose  25 mL Intravenous Once   enoxaparin (LOVENOX) injection  1 mg/kg Subcutaneous Q12H   famotidine  20 mg Oral Daily   feeding supplement (PRO-STAT SUGAR FREE 64)  30 mL Oral TID WC   insulin aspart  0-5 Units  Subcutaneous QHS   insulin aspart  0-9 Units Subcutaneous TID WC   insulin detemir  20 Units Subcutaneous Daily   Ipratropium-Albuterol  2 puff Inhalation Q6H   [START ON 10/19/2019] methylPREDNISolone (SOLU-MEDROL) injection  5.2 mg Intravenous Daily   multivitamin with minerals  1 tablet Oral Daily   ticagrelor  90 mg Oral BID   vitamin B-12  100 mcg Oral Daily   vitamin C  500 mg Oral Daily   zinc sulfate  220 mg Oral Daily   Continuous Infusions:  lactated ringers 100 mL/hr at 10/18/19 0808   PRN Meds:.acetaminophen, lip balm, nitroGLYCERIN, [DISCONTINUED] ondansetron **OR** ondansetron (ZOFRAN) IV, sodium chloride, tetrahydrozoline  Time Spent in minutes  25   See all Orders from today for further details  Lala Lund M.D on 10/18/2019 at 12:07 PM  To page go to [email or web address] - use universal password  [facility] -  Office  [phone]    Objective:   Vitals:   10/18/19 0117 10/18/19 0308 10/18/19 0716 10/18/19 1145  BP:  123/70 116/64 131/61  Pulse:   71 68  Resp:  20 18 18   Temp:  98.6 F (37 C) 98.3 F (36.8 C) 97.7 F (36.5 C)  TempSrc:  Axillary Oral Axillary  SpO2: 92% 90% 91% 92%  Weight:  88.1 kg    Height:        Wt Readings from Last 3 Encounters:  10/18/19  88.1 kg  10/12/2019 102.1 kg  03/30/14 97.5 kg     Intake/Output Summary (Last 24 hours) at 10/18/2019 1207 Last data filed at 10/18/2019 1145 Gross per 24 hour  Intake 170 ml  Output 3135 ml  Net -2965 ml     Physical Exam  Awake Alert, No new F.N deficits, Normal affect [place].AT,PERRAL Supple Neck,No JVD, No cervical lymphadenopathy appriciated.  Symmetrical Chest wall movement, Good air movement bilaterally, CTAB RRR,No Gallops, Rubs or new Murmurs, No Parasternal Heave +ve B.Sounds, Abd Soft, No tenderness, No organomegaly appriciated, No rebound - guarding or rigidity. No Cyanosis, Clubbing or edema, No new Rash or bruise   Data Review:    CBC  Recent Labs    Lab 10/14/19 0320 10/15/19 0203 10/16/19 0355 10/17/19 0224 10/18/19 0210  WBC 15.9* 17.6* 14.5* 13.2* 14.5*  HGB 13.0 13.0 13.0 12.6* 12.6*  HCT 40.9 41.4 40.9 39.3 39.7  PLT 474* 470* 451* 426* 411*  MCV 78.5* 79.2* 78.7* 78.0* 78.9*  MCH 25.0* 24.9* 25.0* 25.0* 25.0*  MCHC 31.8 31.4 31.8 32.1 31.7  RDW 14.8 15.0 15.1 15.0 15.2  LYMPHSABS 0.4* 0.3* 0.6* 0.5* 0.6*  MONOABS 0.2 0.2 0.3 0.3 0.2  EOSABS 0.0 0.1 0.2 0.2 0.2  BASOSABS 0.0 0.0 0.0 0.0 0.0    Chemistries   Recent Labs  Lab 10/14/19 0320 10/15/19 0203 10/16/19 0355 10/17/19 0224 10/18/19 0210  NA 142 143 140 141 140  K 5.1 4.9 4.9 4.5 4.3  CL 108 108 107 104 102  CO2 24 25 25 26 25   GLUCOSE 77 62* 44* 214* 180*  BUN 33* 38* 48* 68* 80*  CREATININE 1.16 1.31* 1.25* 1.43* 1.62*  CALCIUM 8.8* 8.7* 8.6* 8.9 9.1  MG 2.6* 2.8* 2.5* 2.8* 2.9*  AST 44* 40 39 33 37  ALT 48* 51* 51* 56* 55*  ALKPHOS 183* 153* 127* 127* 129*  BILITOT 0.5 0.7 0.9 0.6 0.3   ------------------------------------------------------------------------------------------------------------------ No results for input(s): CHOL, HDL, LDLCALC, TRIG, CHOLHDL, LDLDIRECT in the last 72 hours.  Lab Results  Component Value Date   HGBA1C 8.1 (H) 10/08/2019   ------------------------------------------------------------------------------------------------------------------ No results for input(s): TSH, T4TOTAL, T3FREE, THYROIDAB in the last 72 hours.  Invalid input(s): FREET3 ------------------------------------------------------------------------------------------------------------------  Micro Results No results found for this or any previous visit (from the past 240 hour(s)).  Radiology Reports [place]  Result Date: 10/09/2019 CLINICAL DATA:  COVID pneumonia, shortness of breath, concern for PE EXAM: CT ANGIOGRAPHY CHEST WITH CONTRAST TECHNIQUE: Multidetector CT imaging of the chest was performed using the standard protocol during bolus  administration of intravenous contrast. Multiplanar CT image reconstructions and MIPs were obtained to evaluate the vascular anatomy. CONTRAST:  82mL OMNIPAQUE IOHEXOL 350 MG/ML SOLN COMPARISON:  None. FINDINGS: Cardiovascular: No significant filling defect or pulmonary embolus demonstrated by CTA. Pulmonary arteries appear patent. Limited assessment of the lower lobes with motion artifact from breathing. Minor aortic atherosclerosis. Negative for aneurysm or dissection. Patent two-vessel arch anatomy. No mediastinal hemorrhage or hematoma. Native coronary atherosclerosis noted. Heart size without pericardial effusion Mediastinum/Nodes: Prominent thyroid noted extending substernal. Trachea and esophagus unremarkable. Esophagus is nondilated. Mildly prominent prevascular, hilar and subcarinal lymph nodes, suspect reactive/inflammatory. No bulky adenopathy. Lungs/Pleura: Extensive diffuse and patchy ground-glass opacities throughout all lobes of both lungs with some sparing of the lung apices. There is associated bibasilar consolidation/atelectasis with central air bronchograms. Appearance compatible with atypical pneumonia/viral pneumonia as seen with COVID-19. No significant effusion. No other pleural abnormality or pneumothorax. Trachea and central  airways remain patent. No mucous plugging or obstruction. Upper Abdomen: Fatty infiltration of the liver suspected. No acute finding in the upper abdomen. Musculoskeletal: No chest wall abnormality. No acute or significant osseous findings. Review of the MIP images confirms the above findings. IMPRESSION: 1. Negative for significant acute pulmonary embolus by CTA. 2. Extensive diffuse and patchy ground-glass opacities throughout all lobes of both lungs with some sparing of the lung apices. Findings compatible with atypical pneumonia/viral pneumonia as seen with COVID-19. Aortic Atherosclerosis (ICD10-I70.0). Electronically Signed   By: Jerilynn Mages.  Shick M.D.   On: 10/09/2019  09:23   Dg Chest Port 1 View  Result Date: 10/15/2019 CLINICAL DATA:  COVID-19 positive, shortness of breath. EXAM: PORTABLE CHEST 1 VIEW COMPARISON:  Chest x-rays dated 10/13/2019 and 10/04/2019. FINDINGS: Ground-glass opacities are again seen at the lung bases bilaterally, improved compared to the most recent chest x-ray of 10/13/2019, now similar to the earlier plain film of 10/04/2019. No pneumothorax seen. Heart size and mediastinal contours are stable. IMPRESSION: Bibasilar pneumonia, improved compared to most recent chest x-ray of 10/13/2019. Electronically Signed   By: Franki Cabot M.D.   On: 10/15/2019 08:20   Dg Chest Port 1 View  Result Date: 10/13/2019 CLINICAL DATA:  Shortness of breath, COVID-19 EXAM: PORTABLE CHEST 1 VIEW COMPARISON:  CTA chest dated 10/09/2019 FINDINGS: Multifocal pneumonia with relative sparing of the left upper lobe. No definite pleural effusions. No pneumothorax. Cardiomegaly. IMPRESSION: Multifocal pneumonia in this patient with known COVID. Electronically Signed   By: Julian Hy M.D.   On: 10/13/2019 09:44   Dg Chest Port 1 View  Result Date: 10/04/2019 CLINICAL DATA:  Hypoxia EXAM: PORTABLE CHEST 1 VIEW COMPARISON:  10/15/2019 FINDINGS: Interstitial/patchy opacities in the bilateral upper and lower lobes, unchanged. No definite pleural effusions. No pneumothorax. The heart is top-normal in size. IMPRESSION: Multifocal pneumonia in this patient with known COVID, unchanged. Electronically Signed   By: Julian Hy M.D.   On: 10/04/2019 10:07   Dg Chest Portable 1 View  Result Date: 10/04/2019 CLINICAL DATA:  Hypoxia, shortness of breath. EXAM: PORTABLE CHEST 1 VIEW COMPARISON:  October 02, 2019. FINDINGS: The heart size and mediastinal contours are within normal limits. No pneumothorax or pleural effusion is noted. Stable left perihilar and lingular opacity is noted as well as mild right upper lobe opacity consistent with multifocal pneumonia.  The visualized skeletal structures are unremarkable. IMPRESSION: Stable bilateral lung opacities are noted, left greater than right, consistent with multifocal pneumonia. Electronically Signed   By: Marijo Conception M.D.   On: 09/18/2019 14:14   Dg Chest Port 1 View  Result Date: 10/05/2019 CLINICAL DATA:  Pt complains of chest pain x 3 days, non-radiating, worse with inspiration, cough, fever, and generally feeling unwell. Pt was unable to hold deep inspiration for x-ray due to coughing. EXAM: PORTABLE CHEST 1 VIEW COMPARISON:  01/07/2014 FINDINGS: Hazy interstitial and airspace opacities present in the left perihilar region, right upper lobe, and left lower lobe. Low lung volumes are present, causing crowding of the pulmonary vasculature. No blunting of the costophrenic angles. Cardiac and mediastinal margins appear normal. IMPRESSION: 1. Hazy interstitial and airspace opacities in the left perihilar region, right upper lobe, and left lower lobe, suspicious for multilobar pneumonia or atypical pneumonia. 2. Low lung volumes. Electronically Signed   By: Van Clines M.D.   On: 09/30/2019 18:57   Leg [place] [facility]  Result Date: 10/10/2019  Lower Venous Study Indications: Covid positive, elevated D-Dimer.  Comparison  Study: Prior negative Bilateral LEV done 10/04/19 Performing Technologist: Sharion Dove RVS  Examination Guidelines: A complete evaluation includes B-mode imaging, spectral Doppler, color Doppler, and power Doppler as needed of all accessible portions of each vessel. Bilateral testing is considered an integral part of a complete examination. Limited examinations for reoccurring indications may be performed as noted.  +---------+---------------+---------+-----------+----------+--------------+  RIGHT     Compressibility Phasicity Spontaneity Properties Thrombus Aging  +---------+---------------+---------+-----------+----------+--------------+  CFV       Full            Yes       Yes                                     +---------+---------------+---------+-----------+----------+--------------+  SFJ       Full                                                             +---------+---------------+---------+-----------+----------+--------------+  FV Prox   Full                                                             +---------+---------------+---------+-----------+----------+--------------+  FV Mid    Full                                                             +---------+---------------+---------+-----------+----------+--------------+  FV Distal Full                                                             +---------+---------------+---------+-----------+----------+--------------+  PFV       Full                                                             +---------+---------------+---------+-----------+----------+--------------+  POP       Full            Yes       Yes                                    +---------+---------------+---------+-----------+----------+--------------+  PTV       Full                                                             +---------+---------------+---------+-----------+----------+--------------+  PERO      Full                                                             +---------+---------------+---------+-----------+----------+--------------+  Gastroc   None                                             Acute           +---------+---------------+---------+-----------+----------+--------------+   +---------+---------------+---------+-----------+----------+--------------+  LEFT      Compressibility Phasicity Spontaneity Properties Thrombus Aging  +---------+---------------+---------+-----------+----------+--------------+  CFV       Full            Yes       Yes                                    +---------+---------------+---------+-----------+----------+--------------+  SFJ       Full                                                              +---------+---------------+---------+-----------+----------+--------------+  FV Prox   Full                                                             +---------+---------------+---------+-----------+----------+--------------+  FV Mid    Full                                                             +---------+---------------+---------+-----------+----------+--------------+  FV Distal Full                                                             +---------+---------------+---------+-----------+----------+--------------+  PFV       Full                                                             +---------+---------------+---------+-----------+----------+--------------+  POP       Full            Yes       Yes                                    +---------+---------------+---------+-----------+----------+--------------+  PTV       Full                                                             +---------+---------------+---------+-----------+----------+--------------+  PERO      Full                                                             +---------+---------------+---------+-----------+----------+--------------+  Gastroc   None                                             Acute           +---------+---------------+---------+-----------+----------+--------------+     Summary: Right: Findings consistent with acute deep vein thrombosis involving the right gastrocnemius veins. Left: Findings consistent with acute deep vein thrombosis involving the left gastrocnemius veins.  *See table(s) above for measurements and observations. Electronically signed by Curt Jews MD on 10/10/2019 at 1:39:57 PM.    Final    [place] Ven[facility]  Result Date: 10/04/2019  Lower Venous Study Indications: Covid-19 positive, and Swelling.  Anticoagulation: Lovenox. Comparison Study: No priors Performing Technologist: Velva Harman Sturdivant RDMS, RVT  Examination Guidelines: A complete evaluation includes B-mode imaging, spectral Doppler, color  Doppler, and power Doppler as needed of all accessible portions of each vessel. Bilateral testing is considered an integral part of a complete examination. Limited examinations for reoccurring indications may be performed as noted.  +---------+---------------+---------+-----------+----------+--------------+  RIGHT     Compressibility Phasicity Spontaneity Properties Thrombus Aging  +---------+---------------+---------+-----------+----------+--------------+  CFV       Full            Yes       Yes                                    +---------+---------------+---------+-----------+----------+--------------+  SFJ       Full                                                             +---------+---------------+---------+-----------+----------+--------------+  FV Prox   Full                                                             +---------+---------------+---------+-----------+----------+--------------+  FV Mid    Full                                                             +---------+---------------+---------+-----------+----------+--------------+  FV Distal Full                                                             +---------+---------------+---------+-----------+----------+--------------+  PFV       Full                                                             +---------+---------------+---------+-----------+----------+--------------+  POP       Full            No        Yes                                    +---------+---------------+---------+-----------+----------+--------------+  PTV       Full                                                             +---------+---------------+---------+-----------+----------+--------------+  PERO      Full                                                             +---------+---------------+---------+-----------+----------+--------------+   +---------+---------------+---------+-----------+----------+--------------+  LEFT       Compressibility Phasicity Spontaneity Properties Thrombus Aging  +---------+---------------+---------+-----------+----------+--------------+  CFV       Full            Yes       Yes                                    +---------+---------------+---------+-----------+----------+--------------+  SFJ       Full                                                             +---------+---------------+---------+-----------+----------+--------------+  FV Prox   Full                                                             +---------+---------------+---------+-----------+----------+--------------+  FV Mid    Full                                                             +---------+---------------+---------+-----------+----------+--------------+  FV Distal Full                                                             +---------+---------------+---------+-----------+----------+--------------+  PFV       Full                                                             +---------+---------------+---------+-----------+----------+--------------+  POP       Full            Yes       Yes                                    +---------+---------------+---------+-----------+----------+--------------+  PTV       Full                                                             +---------+---------------+---------+-----------+----------+--------------+  PERO      Full                                                             +---------+---------------+---------+-----------+----------+--------------+     Summary: Right: There is no evidence of deep vein thrombosis in the lower extremity. No cystic structure found in the popliteal fossa. Left: There is no evidence of deep vein thrombosis in the lower extremity. No cystic structure found in the popliteal fossa.  *See table(s) above for measurements and observations. Electronically signed by Ruta Hinds MD on 10/04/2019 at 3:31:58 PM.    Final

## 2019-10-18 NOTE — Progress Notes (Signed)
Inpatient Diabetes Program Recommendations  AACE/ADA: New Consensus Statement on Inpatient Glycemic Control (2015)  Target Ranges:  Prepandial:   less than 140 mg/dL      Peak postprandial:   less than 180 mg/dL (1-2 hours)      Critically ill patients:  140 - 180 mg/dL   Lab Results  Component Value Date   GLUCAP 342 (H) 10/18/2019   HGBA1C 8.1 (H) 09/29/2019    Review of Glycemic Control Results for William [name], William [name] (MRN [id number]) as of 10/18/2019 13:19  Ref. Range 10/17/2019 11:41 10/17/2019 16:53 10/17/2019 21:49 10/18/2019 07:15 10/18/2019 11:49  Glucose-Capillary Latest Ref Range: 70 - 99 mg/dL 525 (HH) 239 (H) 123 (H) 234 (H) 342 (H)  Diabetes history: DM 2 Outpatient Diabetes medications:  Jardiance 25 mg daily, Linagliptin/metformin 2.03/999 mg daily Current orders for Inpatient glycemic control:  Solumedrol 30 mg daily Levemir 9 units daily Novolog sensitive tid with meals and HS  Inpatient Diabetes Program Recommendations:   Consider adding Novolog meal coverage 3 units tid with meals (hold if patient eats less than 50%).    Thanks, Adah Perl, RN, BC-ADM Inpatient Diabetes Coordinator Pager [phone] (8a-5p)

## 2019-10-18 NOTE — Progress Notes (Signed)
Physical Therapy Treatment Patient Details Name: William [name] MRN: [id number] DOB: 1956/05/16 Today's Date: 10/18/2019    History of Present Illness Patient is a 63 y.o. male with PMHx of HTN, dyslipidemia, DM-2, CAD-who presented with shortness of breath and cough-he was found to have acute hypoxic respiratory failure secondary to COVID-19 pneumonia.    PT Comments    Patient in recliner in NRB at 12 L, and HFNC at 15+_to the top past 15. Patient SPO2 at 95% when not doing any moving, drops into high 70's when performs a few reps of theraband exercises, has to rest and performs a few more. Patient declined standing as he clearly has respiratory distress . Continue PT for  Increased activity as tolerated.  Follow Up Recommendations  Other (comment)     Equipment Recommendations  None recommended by PT    Recommendations for Other Services       Precautions / Restrictions Precautions Precaution Comments: sats drop quickly,on NRB and HFNC    Mobility  Bed Mobility               General bed mobility comments: Sitting in recliner  Transfers   Equipment used: None             General transfer comment: did not attempt to stand as  SPO2 into 70's with UE exercises. , patient performed seated exercises with light theraband  Ambulation/Gait                 Stairs             Wheelchair Mobility    Modified Rankin (Stroke Patients Only)       Balance                                            Cognition Arousal/Alertness: Awake/alert Behavior During Therapy: Anxious                                   General Comments: Benefits from calm cues for breathing      Exercises Other Exercises Other Exercises: Therabqnd shouler abd, horizontal abduction    General Comments        Pertinent Vitals/Pain Pain Assessment: No/denies pain    Home Living                      Prior Function           PT Goals (current goals can now be found in the care plan section) Progress towards PT goals: (respiratory status remains significant deterrent)    Frequency    Min 3X/week      PT Plan Current plan remains appropriate;Other (comment)(unsure due to severe resp staus currently)    Co-evaluation              AM-PAC PT "6 Clicks" Mobility   Outcome Measure  Help needed turning from your back to your side while in a flat bed without using bedrails?: None Help needed moving from lying on your back to sitting on the side of a flat bed without using bedrails?: None Help needed moving to and from a bed to a chair (including a wheelchair)?: A Little Help needed standing up from a chair using your arms (e.g., wheelchair or bedside chair)?: A Little Help needed  to walk in hospital room?: Total Help needed climbing 3-5 steps with a railing? : Total 6 Click Score: 16    End of Session Equipment Utilized During Treatment: Oxygen Activity Tolerance: Treatment limited secondary to medical complications (Comment) Patient left: in chair;with call bell/phone within reach Nurse Communication: Other (comment)(difficulty progressing patient) PT Visit Diagnosis: Unsteadiness on feet (R26.81);Difficulty in walking, not elsewhere classified (R26.2)     Time: 1210-1225 PT Time Calculation (min) (ACUTE ONLY): 15 min  Charges:  $Therapeutic Exercise: 8-22 mins                     Tresa Endo PT Acute Rehabilitation Services  Office [phone]    William [name] 10/18/2019, 3:47 PM

## 2019-10-18 DEATH — deceased

## 2019-10-19 DIAGNOSIS — E43 Unspecified severe protein-calorie malnutrition: Secondary | ICD-10-CM | POA: Insufficient documentation

## 2019-10-19 LAB — BRAIN NATRIURETIC PEPTIDE: B Natriuretic Peptide: 40 pg/mL (ref 0.0–100.0)

## 2019-10-19 LAB — COMPREHENSIVE METABOLIC PANEL
ALT: 52 U/L — ABNORMAL HIGH (ref 0–44)
AST: 36 U/L (ref 15–41)
Albumin: 2.7 g/dL — ABNORMAL LOW (ref 3.5–5.0)
Alkaline Phosphatase: 130 U/L — ABNORMAL HIGH (ref 38–126)
Anion gap: 13 (ref 5–15)
BUN: 73 mg/dL — ABNORMAL HIGH (ref 8–23)
CO2: 26 mmol/L (ref 22–32)
Calcium: 9.2 mg/dL (ref 8.9–10.3)
Chloride: 101 mmol/L (ref 98–111)
Creatinine, Ser: 1.52 mg/dL — ABNORMAL HIGH (ref 0.61–1.24)
GFR calc Af Amer: 56 mL/min — ABNORMAL LOW (ref >60)
GFR calc non Af Amer: 48 mL/min — ABNORMAL LOW (ref >60)
Glucose, Bld: 50 mg/dL — ABNORMAL LOW (ref 70–99)
Potassium: 4.2 mmol/L (ref 3.5–5.1)
Sodium: 140 mmol/L (ref 135–145)
Total Bilirubin: 0.5 mg/dL (ref 0.3–1.2)
Total Protein: 6.8 g/dL (ref 6.5–8.1)

## 2019-10-19 LAB — CBC WITH DIFFERENTIAL/PLATELET
Abs Immature Granulocytes: 0.09 10*3/uL — ABNORMAL HIGH (ref 0.00–0.07)
Basophils Absolute: 0 10*3/uL (ref 0.0–0.1)
Basophils Relative: 0 %
Eosinophils Absolute: 0.4 10*3/uL (ref 0.0–0.5)
Eosinophils Relative: 3 %
HCT: 41 % (ref 39.0–52.0)
Hemoglobin: 12.9 g/dL — ABNORMAL LOW (ref 13.0–17.0)
Immature Granulocytes: 1 %
Lymphocytes Relative: 4 %
Lymphs Abs: 0.5 10*3/uL — ABNORMAL LOW (ref 0.7–4.0)
MCH: 25 pg — ABNORMAL LOW (ref 26.0–34.0)
MCHC: 31.5 g/dL (ref 30.0–36.0)
MCV: 79.6 fL — ABNORMAL LOW (ref 80.0–100.0)
Monocytes Absolute: 0.3 10*3/uL (ref 0.1–1.0)
Monocytes Relative: 2 %
Neutro Abs: 11.8 10*3/uL — ABNORMAL HIGH (ref 1.7–7.7)
Neutrophils Relative %: 90 %
Platelets: 395 10*3/uL (ref 150–400)
RBC: 5.15 MIL/uL (ref 4.22–5.81)
RDW: 15.2 % (ref 11.5–15.5)
WBC: 13.1 10*3/uL — ABNORMAL HIGH (ref 4.0–10.5)
nRBC: 0 % (ref 0.0–0.2)

## 2019-10-19 LAB — GLUCOSE, CAPILLARY
Glucose-Capillary: 115 mg/dL — ABNORMAL HIGH (ref 70–99)
Glucose-Capillary: 419 mg/dL — ABNORMAL HIGH (ref 70–99)
Glucose-Capillary: 321 mg/dL — ABNORMAL HIGH (ref 70–99)
Glucose-Capillary: 267 mg/dL — ABNORMAL HIGH (ref 70–99)

## 2019-10-19 LAB — C-REACTIVE PROTEIN: CRP: <0.8 mg/dL (ref <1.0)

## 2019-10-19 LAB — HEPARIN ANTI-XA: Heparin LMW: 1.54 IU/mL

## 2019-10-19 LAB — D-DIMER, QUANTITATIVE: D-Dimer, Quant: 2.22 ug/mL-FEU — ABNORMAL HIGH (ref 0.00–0.50)

## 2019-10-19 LAB — MAGNESIUM: Magnesium: 2.7 mg/dL — ABNORMAL HIGH (ref 1.7–2.4)

## 2019-10-19 LAB — PROCALCITONIN: Procalcitonin: 0.18 ng/mL

## 2019-10-19 MED ORDER — INSULIN ASPART 100 UNIT/ML ~~LOC~~ SOLN
0.0000 [IU] | Freq: Three times a day (TID) | SUBCUTANEOUS | Status: DC
  Administered 2019-10-19: 11 [IU] via SUBCUTANEOUS
  Administered 2019-10-20: 3 [IU] via SUBCUTANEOUS
  Administered 2019-10-20: 15 [IU] via SUBCUTANEOUS

## 2019-10-19 MED ORDER — ENOXAPARIN SODIUM 60 MG/0.6ML ~~LOC~~ SOLN
60.0000 mg | Freq: Two times a day (BID) | SUBCUTANEOUS | Status: DC
  Administered 2019-10-20 (×2): 60 mg via SUBCUTANEOUS
  Filled 2019-10-19: qty 0.6

## 2019-10-19 MED ORDER — ENSURE ENLIVE PO LIQD
237.0000 mL | Freq: Three times a day (TID) | ORAL | Status: DC
  Administered 2019-10-20: 237 mL via ORAL

## 2019-10-19 MED ORDER — INSULIN ASPART 100 UNIT/ML ~~LOC~~ SOLN
10.0000 [IU] | Freq: Once | SUBCUTANEOUS | Status: AC
  Administered 2019-10-19: 10 [IU] via SUBCUTANEOUS

## 2019-10-19 NOTE — Progress Notes (Signed)
Inpatient Diabetes Program Recommendations  AACE/ADA: New Consensus Statement on Inpatient Glycemic Control (2015)  Target Ranges:  Prepandial:   less than 140 mg/dL      Peak postprandial:   less than 180 mg/dL (1-2 hours)      Critically ill patients:  140 - 180 mg/dL   Lab Results  Component Value Date   GLUCAP 419 (H) 10/19/2019   HGBA1C 8.1 (H) 10/07/2019    Review of Glycemic Control Results for William [name], William [name] (MRN [id number]) as of 10/19/2019 13:34  Ref. Range 10/19/2019 08:00 10/19/2019 11:50  Glucose-Capillary Latest Ref Range: 70 - 99 mg/dL 115 (H) 419 (H)   Diabetes history: DM 2 Outpatient Diabetes medications: Jardiance 25 mg daily, Linagliptin/metformin 2.03/999 mg daily Current orders for Inpatient glycemic control:  Levemir 20 units daily Novolog Moderate tid with meals and HS  Ensure Enlive tid between meals Solumedrol 5.2 mg Daily BUN/Creat:  73/1.52  Inpatient Diabetes Program Recommendations:     Fasting glucose looks great 115 mg/dl.  Patient may benefit from Novolog meal coverage 3 units tid with meals (hold if patient eats less than 50%).    Thanks, Tama Headings RN, MSN, BC-ADM Inpatient Diabetes Coordinator Team Pager [phone] (8a-5p)

## 2019-10-19 NOTE — Progress Notes (Signed)
Occupational Therapy Treatment Patient Details Name: William [name] MRN: [id number] DOB: 1956/11/07 Today's Date: 10/19/2019    History of present illness Patient is a 63 y.o. male with PMHx of HTN, dyslipidemia, DM-2, CAD-who presented with shortness of breath and cough-he was found to have acute hypoxic respiratory failure secondary to COVID-19 pneumonia.   OT comments  Pt making slow progress towards OT goals, continues to have limitations due to poor respiratory status/endurance with minimal activity. Pt on >15L HFNC and NRB throughout session. Pt tolerating seated UB/LB exercise and sit<>stand/march in place (x2) during session. Pt requiring increased time/effort for all tasks. Pt desats very easily with lowest noted 75% this session, returns to high 80s within approx 1 min. Additional focus on utilizing deep breathing techniques during completion of all exercises/activity. Pt requires frequent cues to continue these techniques throughout activity but with fair carryover noted. Will continue per POC at this time.   Follow Up Recommendations  CIR;Supervision/Assistance - 24 hour    Equipment Recommendations  Tub/shower seat          Precautions / Restrictions Precautions Precautions: Fall;Other (comment) Precaution Comments: sats drop quickly,on NRB and HFNC Restrictions Weight Bearing Restrictions: No       Mobility Bed Mobility               General bed mobility comments: Sitting in recliner  Transfers Overall transfer level: Needs assistance Equipment used: None Transfers: Sit to/from Stand Sit to Stand: Min guard         General transfer comment: for safety and balance    Balance Overall balance assessment: Mild deficits observed, not formally tested                                         ADL either performed or assessed with clinical judgement   ADL Overall ADL's : Needs assistance/impaired Eating/Feeding: Modified  independent;Sitting                   Lower Body Dressing: Min guard;Minimal assistance;Sit to/from stand Lower Body Dressing Details (indicate cue type and reason): pt donning/adjusting shoes while seated in recliner             Functional mobility during ADLs: Min guard(sit<>Stand) General ADL Comments: focus of session on implementing/utilizing deep breathing techniques during activity     Vision       Perception     Praxis      Cognition Arousal/Alertness: Awake/alert Behavior During Therapy: Anxious Overall Cognitive Status: Within Functional Limits for tasks assessed                                 General Comments: Benefits from calm cues for breathing        Exercises Exercises: Other exercises;General Upper Extremity;General Lower Extremity General Exercises - Upper Extremity Shoulder Horizontal ABduction: AROM;Both;5 reps;Seated Theraband Level (Shoulder Horizontal Abduction): Level 1 (Yellow) Shoulder Horizontal ADduction: AROM;5 reps;Both;Seated;Theraband Theraband Level (Shoulder Horizontal Adduction): Level 1 (Yellow) General Exercises - Lower Extremity Long Arc Quad: AROM;Both;15 reps;Seated Other Exercises Other Exercises: x2 sit<>stand and marching in place, static standing Other Exercises: deep breathing techniques with mod cues for technique   Shoulder Instructions       General Comments      Pertinent Vitals/ Pain       Pain Assessment: No/denies pain  Home Living                                          Prior Functioning/Environment              Frequency  Min 2X/week        Progress Toward Goals  OT Goals(current goals can now be found in the care plan section)  Progress towards OT goals: Progressing toward goals  Acute Rehab OT Goals Patient Stated Goal: to be able to walk in the park OT Goal Formulation: With patient Time For Goal Achievement: 10/24/19 Potential to Achieve  Goals: Good ADL Goals Pt Will Perform Grooming: with modified independence;standing Pt Will Perform Lower Body Bathing: with modified independence;sit to/from stand Pt Will Perform Lower Body Dressing: with modified independence;sit to/from stand Pt Will Transfer to Toilet: with modified independence;ambulating Pt Will Perform Toileting - Clothing Manipulation and hygiene: with modified independence;sit to/from stand Pt/caregiver will Perform Home Exercise Program: Increased strength;Both right and left upper extremity;With written HEP provided;With theraband Additional ADL Goal #1: Pt will tolerate >46min seated/standing activity at mod independent level with VSS.  Plan Discharge plan remains appropriate    Co-evaluation                 AM-PAC OT "6 Clicks" Daily Activity     Outcome Measure   Help from another person eating meals?: None Help from another person taking care of personal grooming?: None Help from another person toileting, which includes using toliet, bedpan, or urinal?: A Little Help from another person bathing (including washing, rinsing, drying)?: A Little Help from another person to put on and taking off regular upper body clothing?: A Little Help from another person to put on and taking off regular lower body clothing?: A Little 6 Click Score: 20    End of Session Equipment Utilized During Treatment: Oxygen  OT Visit Diagnosis: Other (comment);Muscle weakness (generalized) (M62.81)(decreased activity tolerance)   Activity Tolerance Patient tolerated treatment well   Patient Left in chair;with call bell/phone within reach   Nurse Communication Mobility status        Time: [id number] OT Time Calculation (min): 35 min  Charges: OT General Charges $OT Visit: 1 Visit OT Treatments $Therapeutic Activity: 23-37 mins  Lou Cal, OT Supplemental Rehabilitation Services Pager [phone] Office [phone]    [name] 10/19/2019,  1:07 PM

## 2019-10-19 NOTE — Plan of Care (Signed)
  Problem: Education: Goal: Knowledge of risk factors and measures for prevention of condition will improve Outcome: Progressing   Problem: Coping: Goal: Psychosocial and spiritual needs will be supported Outcome: Progressing   Problem: Respiratory: Goal: Will maintain a patent airway Outcome: Progressing   Problem: Education: Goal: Knowledge of General Education information will improve Description: Including pain rating scale, medication(s)/side effects and non-pharmacologic comfort measures Outcome: Progressing   Problem: Health Behavior/Discharge Planning: Goal: Ability to manage health-related needs will improve Outcome: Progressing   Problem: Clinical Measurements: Goal: Ability to maintain clinical measurements within normal limits will improve Outcome: Progressing Goal: Will remain free from infection Outcome: Progressing Goal: Diagnostic test results will improve Outcome: Progressing Goal: Respiratory complications will improve Outcome: Progressing Goal: Cardiovascular complication will be avoided Outcome: Progressing   Problem: Nutrition: Goal: Adequate nutrition will be maintained Outcome: Progressing   Problem: Coping: Goal: Level of anxiety will decrease Outcome: Progressing   Problem: Elimination: Goal: Will not experience complications related to bowel motility Outcome: Progressing Goal: Will not experience complications related to urinary retention Outcome: Progressing   Problem: Pain Managment: Goal: General experience of comfort will improve Outcome: Progressing   Problem: Safety: Goal: Ability to remain free from injury will improve Outcome: Progressing   Problem: Skin Integrity: Goal: Risk for impaired skin integrity will decrease Outcome: Progressing   

## 2019-10-19 NOTE — Progress Notes (Addendum)
Initial Nutrition Assessment  DOCUMENTATION CODES:   Severe malnutrition in context of acute illness/injury  INTERVENTION:    Ensure Enlive po TID, each supplement provides 350 kcal and 20 grams of protein.  Continue Pro-stat 30 ml TID, each supplement provides 100 kcal and 15 gm protein.  Pt receiving Hormel Shake daily with Breakfast which provides 520 kcals and 22 g of protein and Magic cup BID with lunch and dinner, each supplement provides 290 kcal and 9 grams of protein, automatically on meal trays to optimize nutritional intake.    If within goals of care, consider Cortrak tube placement for tube feeding. Osmolite 1.5 at 70 ml/h with Pro-stat 30 ml BID would meet 100% of nutrition needs.   Recommend adjusting insulin regimen to include meal coverage per Diabetes Treatment Team recommendations.   NUTRITION DIAGNOSIS:   Severe Malnutrition related to acute illness(COVID-19) as evidenced by energy intake < or equal to 50% for > or equal to 5 days, percent weight loss(3% weight loss within 1 week).  GOAL:   Patient will meet greater than or equal to 90% of their needs  MONITOR:   PO intake, Supplement acceptance, Labs, Skin  REASON FOR ASSESSMENT:   Other (Comment)(poor PO intake)    ASSESSMENT:   63 yo male admitted with SOB, COVID-19 positive. He was sick 5-6 days before coming to the hospital. PMH includes HTN, DM-2, CAD.   During initial part of hospitalization, patient was eating well, consuming 75-100% of meals. Over the past 5 days, he has been consuming 0-25% of most meals. He did eat 50% of breakfast yesterday.   Labs reviewed. BUN 73 (H), creatinine 1.52 (H), magnesium 2.7 (H) CBG's: 220-100-115  Medications reviewed and include Novolog SSI with meals and bedtime (no meal coverage), Levemir once daily, solu-medrol, MVI, vitamin B-12, vitamin C, zinc sulfate.   Admission weight appears to be a stated weight of 225 lbs. Weight has decreased from 91.5 kg on  11/28 to 89.1 kg on 12/2. 3% weight loss within the past week is significant for the time frame. Weight loss associated with minimal intake since 11/27.   NUTRITION - FOCUSED PHYSICAL EXAM:  deferred  Diet Order:   Diet Order            Diet Carb Modified Fluid consistency: Thin; Room service appropriate? Yes  Diet effective now              EDUCATION NEEDS:   Education needs have been addressed  Skin:  Skin Assessment: Reviewed RN Assessment  Last BM:  11/30  Height:   Ht Readings from Last 1 Encounters:  10/14/2019 5\' 7"  (1.702 m)    Weight:   Wt Readings from Last 1 Encounters:  10/19/19 89.1 kg    Ideal Body Weight:  67.3 kg  BMI:  Body mass index is 30.77 kg/m.  Estimated Nutritional Needs:   Kcal:  [id number]  Protein:  130-150 gm  Fluid:  >/= 2.2 L    Molli Barrows, RD, LDN, [place] Pager [phone] After Hours Pager [phone]

## 2019-10-19 NOTE — Progress Notes (Signed)
ANTICOAGULATION CONSULT NOTE - Follow Up Consult  Pharmacy Consult for Lovenox Indication: VTE treatment  Allergies  Allergen Reactions  . Shellfish Allergy Hives    Patient Measurements: Height: 5\' 7"  (170.2 cm) Weight: 196 lb 6.9 oz (89.1 kg) IBW/kg (Calculated) : 66.1 Heparin Dosing Weight:   Vital Signs: Temp: 97.7 F (36.5 C) (12/02 1147) Temp Source: Axillary (12/02 1147) BP: 123/65 (12/02 1147) Pulse Rate: 76 (12/02 1147)  Labs: Recent Labs    10/17/19 0224 10/18/19 0210 10/19/19 0210 10/19/19 1228  HGB 12.6* 12.6* 12.9*  --   HCT 39.3 39.7 41.0  --   PLT 426* 411* 395  --   HEPRLOWMOCWT  --   --   --  1.60  CREATININE 1.43* 1.62* 1.52*  --     Estimated Creatinine Clearance: 53.7 mL/min (A) (by C-G formula based on SCr of 1.52 mg/dL (H)).   Medications:  Scheduled:  . amLODipine  10 mg Oral Daily  . aspirin EC  81 mg Oral Daily  . atorvastatin  40 mg Oral q1800  . carvedilol  12.5 mg Oral BID WC  . Chlorhexidine Gluconate Cloth  6 each Topical Daily  . dextrose  25 mL Intravenous Once  . enoxaparin (LOVENOX) injection  1 mg/kg Subcutaneous Q12H  . famotidine  20 mg Oral Daily  . feeding supplement (ENSURE ENLIVE)  237 mL Oral TID BM  . feeding supplement (PRO-STAT SUGAR FREE 64)  30 mL Oral TID WC  . insulin aspart  0-15 Units Subcutaneous TID WC  . insulin detemir  20 Units Subcutaneous Daily  . Ipratropium-Albuterol  2 puff Inhalation Q6H  . methylPREDNISolone (SOLU-MEDROL) injection  5.2 mg Intravenous Daily  . multivitamin with minerals  1 tablet Oral Daily  . ticagrelor  90 mg Oral BID  . vitamin B-12  100 mcg Oral Daily  . vitamin C  500 mg Oral Daily  . zinc sulfate  220 mg Oral Daily   Infusions:    Assessment: 53 yoM admitted on 11/16 with COVID-19 pneumonia.  He has been on Lovenox for VTE prophylaxis (BID dosing while in ICU from 11/17-11/21). 11/17 Dopplers negative for DVT 11/22 Dopoplers positive for DVT Pharmacy is now  consulted to dose Lovenox treatment dose for VTE.  - Scr increased at 1.5, CrCl ~ 53 ml/min. - CBC stable  - No bleeding reported - Anti-Xa level is supra-therapeutic at 1.6 (peak drawn 3.5 after dose given)  Goal of Therapy:  Anti-Xa level 0.6-1 units/ml 4hrs after LMWH dose given Monitor platelets by anticoagulation protocol: Yes   Plan:   Delay dose by 3 hours  Reduce to Lovenox 60 mg [place] q12h  Recheck anti-Xa level after the next dose.   Gretta Arab PharmD, BCPS Clinical pharmacist phone 7am- 5pm: [phone] 10/19/2019 2:41 PM

## 2019-10-19 NOTE — Progress Notes (Addendum)
PROGRESS NOTE    William [name]  [id number] DOB: [birth year] DOA: 09/28/2019 PCP: [name], MD    Brief Narrative:  63 year old male who presented with dyspnea.  He does have significant past medical history for hypertension, dyslipidemia, coronary disease, type II days mellitus, and history of DVT.  Patient was diagnosed with COVID-19 prior to hospitalization.  Patient noticed dry cough and dyspnea, he initially presented to [facility] emergency department but left AMA, due to concerns about insurance coverage.  Due to persistent symptoms he returned the following day, his oximetry was 80% on room air, pressure 124/59, pulse rate 81, respiratory rate 34, oxygen saturation 96% on supplemental oxygen, nonrebreather 15 LPM.  Patient had increased work of breathing, no wheezing, no rales, heart S1-S2 present rhythmic, abdomen soft, no lower extremity edema.  His chest radiograph had bilateral interstitial infiltrates, predominantly left lower lobe and right upper lobe.  EKG 83 bpm, normal axis, first-degree AV block, sinus rhythm, no ST segment or T wave changes.  Patient was admitted to the hospital with a working diagnosis of acute hypoxic respiratory failure due to SARS COVID-19 viral pneumonia.   He initially had high oxygen requirements, required heated high flow nasal cannula.  Patient has been treated with systemic corticosteroids, remdesivir, 1 dose of Actemra and 1 infusion of convalescent plasma.  Assessment & Plan:   Active Problems:   Pneumonia due to COVID-19 virus   Diabetes mellitus type 2, uncontrolled, with complications (HCC)   Chronic diastolic CHF (congestive heart failure) (HCC)   Hyperkalemia   Acute renal failure superimposed on stage 3a chronic kidney disease (HCC)   Acute respiratory failure with hypoxia (HCC)   Essential hypertension   Protein-calorie malnutrition, severe   1. Acute hypoxic respiratory failure due to SARS COVID 19 viral pneumonia. Patient  continue to have very high oxygen requirements.   RR: 18  Pulse oxymetry: 93%  Fi02: 15 LPM per HFNC and NRB mask   COVID-19 Labs  Recent Labs    10/17/19 0224 10/18/19 0210 10/19/19 0210  DDIMER 3.66* 2.64* 2.22*  CRP <0.8 <0.8 <0.8    Lab Results  Component Value Date   SARSCOV2NAA POSITIVE (A) 10/17/2019   Inflammatory markers continue to trend down.   Continue antitussive agents, continue to encourage prone position, (not tolerating for prolonged time), incentive spirometer and flutter valve. Out of bed to chair as tolerated. Continue bronchodilator therapy. Taper steroids.   Patient continue to be at high risk for worsening respiratory failure.   2. AKI on CKD stage 3a. Stable renal function with serum cr at 1,52, K at 4,2 and serum bicarbonate at 26. Will continue to follow on renal panel in am.   3. HTN. Continue blood pressure monitoring. Continue with amlodipine, carvedilol.  4. CAD and Dyslipidemia. No chest pain, continue statin therapy. Continue asa and ticagrelor.   5. DVT acute. Continue anticoagulation with enoxaparin   6. Obesity. Calculated BMI is 30,7.   7. T2DM with steroids induced hyperglycemia. Will continue glucose cover and monitoring with insulin sliding scale and basal insulin 20 units.      DVT prophylaxis: enoxaparin   Code Status:  Partial  Family Communication: no family at the bedside  Disposition Plan/ discharge barriers: pending clinical improvement.   Body mass index is 30.77 kg/m. Malnutrition Type:  Nutrition Problem: Severe Malnutrition Etiology: acute illness(COVID-19)   Malnutrition Characteristics:  Signs/Symptoms: energy intake < or equal to 50% for > or equal to 5 days, percent weight loss(3% weight  loss within 1 week) Percent weight loss: 3 %(3% weight loss within one week)   Nutrition Interventions:  Interventions: Ensure Enlive (each supplement provides 350kcal and 20 grams of protein), MVI, Magic cup  RN  Pressure Injury Documentation:     Consultants:     Procedures:     Antimicrobials:       Subjective: Patient continue to have dyspnea and high oxygen requirements, deconditioned and generalized weakness, no nausea or vomiting.   Objective: Vitals:   10/19/19 0021 10/19/19 0401 10/19/19 0600 10/19/19 0800  BP: (!) 166/69 (!) 127/48  127/78  Pulse: (!) 59 78  76  Resp: (!) 22 (!) 22  18  Temp: (!) 97.2 F (36.2 C) 98.8 F (37.1 C)  (!) 96.9 F (36.1 C)  TempSrc: Axillary Axillary  Axillary  SpO2: 92% 90%  (!) 81%  Weight:   89.1 kg   Height:        Intake/Output Summary (Last 24 hours) at 10/19/2019 [id number] Last data filed at 10/19/2019 0800 Gross per 24 hour  Intake 890 ml  Output 2685 ml  Net -1795 ml   Filed Weights   10/17/19 0545 10/18/19 0308 10/19/19 0600  Weight: 88.2 kg 88.1 kg 89.1 kg    Examination:   General: Not in pain, positive dyspnea, deconditioned and ill looking appearing  Neurology: Awake and alert, non focal  E ENT: mild pallor, no icterus, oral mucosa moist Cardiovascular: No JVD. S1-S2 present, rhythmic, no gallops, rubs, or murmurs. No lower extremity edema. Pulmonary: positive breath sounds bilaterally. Gastrointestinal. Abdomen with no organomegaly, non tender, no rebound or guarding Skin. No rashes Musculoskeletal: no joint deformities     Data Reviewed: I have personally reviewed following labs and imaging studies  CBC: Recent Labs  Lab 10/15/19 0203 10/16/19 0355 10/17/19 0224 10/18/19 0210 10/19/19 0210  WBC 17.6* 14.5* 13.2* 14.5* 13.1*  NEUTROABS 16.9* 13.2* 12.2* 13.3* 11.8*  HGB 13.0 13.0 12.6* 12.6* 12.9*  HCT 41.4 40.9 39.3 39.7 41.0  MCV 79.2* 78.7* 78.0* 78.9* 79.6*  PLT 470* 451* 426* 411* [id number]   Basic Metabolic Panel: Recent Labs  Lab 10/13/19 0352 10/14/19 0320 10/15/19 0203 10/16/19 0355 10/17/19 0224 10/18/19 0210 10/19/19 0210  NA 141 142 143 140 141 140 140  K 5.0 5.1 4.9 4.9 4.5 4.3 4.2   CL 108 108 108 107 104 102 101  CO2 24 24 25 25 26 25 26   GLUCOSE 126* 77 62* 44* 214* 180* 50*  BUN 45* 33* 38* 48* 68* 80* 73*  CREATININE 1.32* 1.16 1.31* 1.25* 1.43* 1.62* 1.52*  CALCIUM 8.7* 8.8* 8.7* 8.6* 8.9 9.1 9.2  MG 2.7* 2.6* 2.8* 2.5* 2.8* 2.9* 2.7*  PHOS 3.7 3.8 4.4  --   --   --   --    GFR: Estimated Creatinine Clearance: 53.7 mL/min (A) (by C-G formula based on SCr of 1.52 mg/dL (H)). Liver Function Tests: Recent Labs  Lab 10/15/19 0203 10/16/19 0355 10/17/19 0224 10/18/19 0210 10/19/19 0210  AST 40 39 33 37 36  ALT 51* 51* 56* 55* 52*  ALKPHOS 153* 127* 127* 129* 130*  BILITOT 0.7 0.9 0.6 0.3 0.5  PROT 6.8 6.4* 6.4* 6.6 6.8  ALBUMIN 2.9* 2.7* 2.8* 2.7* 2.7*   No results for input(s): LIPASE, AMYLASE in the last 168 hours. No results for input(s): AMMONIA in the last 168 hours. Coagulation Profile: No results for input(s): INR, PROTIME in the last 168 hours. Cardiac Enzymes: No results for input(s): CKTOTAL,  CKMB, CKMBINDEX, TROPONINI in the last 168 hours. BNP (last 3 results) No results for input(s): PROBNP in the last 8760 hours. HbA1C: No results for input(s): HGBA1C in the last 72 hours. CBG: Recent Labs  Lab 10/18/19 0715 10/18/19 1149 10/18/19 1531 10/18/19 2004 10/19/19 0800  GLUCAP 234* 342* 220* 100* 115*   Lipid Profile: No results for input(s): CHOL, HDL, LDLCALC, TRIG, CHOLHDL, LDLDIRECT in the last 72 hours. Thyroid Function Tests: No results for input(s): TSH, T4TOTAL, FREET4, T3FREE, THYROIDAB in the last 72 hours. Anemia Panel: No results for input(s): VITAMINB12, FOLATE, FERRITIN, TIBC, IRON, RETICCTPCT in the last 72 hours.    Radiology Studies: I have reviewed all of the imaging during this hospital visit personally     Scheduled Meds: . amLODipine  10 mg Oral Daily  . aspirin EC  81 mg Oral Daily  . atorvastatin  40 mg Oral q1800  . carvedilol  12.5 mg Oral BID WC  . Chlorhexidine Gluconate Cloth  6 each Topical  Daily  . dextrose  25 mL Intravenous Once  . enoxaparin (LOVENOX) injection  1 mg/kg Subcutaneous Q12H  . famotidine  20 mg Oral Daily  . feeding supplement (PRO-STAT SUGAR FREE 64)  30 mL Oral TID WC  . insulin aspart  0-5 Units Subcutaneous QHS  . insulin aspart  0-9 Units Subcutaneous TID WC  . insulin detemir  20 Units Subcutaneous Daily  . Ipratropium-Albuterol  2 puff Inhalation Q6H  . methylPREDNISolone (SOLU-MEDROL) injection  5.2 mg Intravenous Daily  . multivitamin with minerals  1 tablet Oral Daily  . ticagrelor  90 mg Oral BID  . vitamin B-12  100 mcg Oral Daily  . vitamin C  500 mg Oral Daily  . zinc sulfate  220 mg Oral Daily   Continuous Infusions:   LOS: 16 days        Mauricio Gerome Apley, MD

## 2019-10-20 ENCOUNTER — Inpatient Hospital Stay (HOSPITAL_COMMUNITY): Payer: Commercial Managed Care - PPO

## 2019-10-20 DIAGNOSIS — N1831 Chronic kidney disease, stage 3a: Secondary | ICD-10-CM | POA: Diagnosis not present

## 2019-10-20 DIAGNOSIS — N179 Acute kidney failure, unspecified: Secondary | ICD-10-CM

## 2019-10-20 DIAGNOSIS — U071 COVID-19: Secondary | ICD-10-CM

## 2019-10-20 DIAGNOSIS — E43 Unspecified severe protein-calorie malnutrition: Secondary | ICD-10-CM

## 2019-10-20 DIAGNOSIS — J9601 Acute respiratory failure with hypoxia: Secondary | ICD-10-CM | POA: Diagnosis not present

## 2019-10-20 LAB — BASIC METABOLIC PANEL
Anion gap: 11 (ref 5–15)
BUN: 70 mg/dL — ABNORMAL HIGH (ref 8–23)
CO2: 25 mmol/L (ref 22–32)
Calcium: 8.8 mg/dL — ABNORMAL LOW (ref 8.9–10.3)
Chloride: 99 mmol/L (ref 98–111)
Creatinine, Ser: 1.41 mg/dL — ABNORMAL HIGH (ref 0.61–1.24)
GFR calc Af Amer: >60 mL/min (ref >60)
GFR calc non Af Amer: 53 mL/min — ABNORMAL LOW (ref >60)
Glucose, Bld: 179 mg/dL — ABNORMAL HIGH (ref 70–99)
Potassium: 4.4 mmol/L (ref 3.5–5.1)
Sodium: 135 mmol/L (ref 135–145)

## 2019-10-20 LAB — GLUCOSE, CAPILLARY
Glucose-Capillary: 184 mg/dL — ABNORMAL HIGH (ref 70–99)
Glucose-Capillary: 469 mg/dL — ABNORMAL HIGH (ref 70–99)
Glucose-Capillary: 380 mg/dL — ABNORMAL HIGH (ref 70–99)
Glucose-Capillary: 263 mg/dL — ABNORMAL HIGH (ref 70–99)
Glucose-Capillary: 265 mg/dL — ABNORMAL HIGH (ref 70–99)

## 2019-10-20 LAB — POCT I-STAT 7, (LYTES, BLD GAS, ICA,H+H)
Acid-Base Excess: 1 mmol/L (ref 0.0–2.0)
Bicarbonate: 28.4 mmol/L — ABNORMAL HIGH (ref 20.0–28.0)
Calcium, Ion: 1.28 mmol/L (ref 1.15–1.40)
HCT: 39 % (ref 39.0–52.0)
Hemoglobin: 13.3 g/dL (ref 13.0–17.0)
O2 Saturation: 76 %
Patient temperature: 98.6
Potassium: 4.8 mmol/L (ref 3.5–5.1)
Sodium: 138 mmol/L (ref 135–145)
TCO2: 30 mmol/L (ref 22–32)
pCO2 arterial: 54.6 mmHg — ABNORMAL HIGH (ref 32.0–48.0)
pH, Arterial: 7.324 — ABNORMAL LOW (ref 7.350–7.450)
pO2, Arterial: 45 mmHg — ABNORMAL LOW (ref 83.0–108.0)

## 2019-10-20 LAB — MAGNESIUM: Magnesium: 3 mg/dL — ABNORMAL HIGH (ref 1.7–2.4)

## 2019-10-20 LAB — PHOSPHORUS: Phosphorus: 5.6 mg/dL — ABNORMAL HIGH (ref 2.5–4.6)

## 2019-10-20 LAB — HEPARIN ANTI-XA: Heparin LMW: 1.22 IU/mL

## 2019-10-20 MED ORDER — GLUCERNA SHAKE PO LIQD
237.0000 mL | Freq: Three times a day (TID) | ORAL | Status: DC
  Administered 2019-10-20: 237 mL via ORAL
  Filled 2019-10-20 (×4): qty 237

## 2019-10-20 MED ORDER — DEXMEDETOMIDINE HCL IN NACL 200 MCG/50ML IV SOLN
0.0000 ug/kg/h | INTRAVENOUS | Status: DC
  Filled 2019-10-20 (×2): qty 50

## 2019-10-20 MED ORDER — VITAL HIGH PROTEIN PO LIQD
1000.0000 mL | ORAL | Status: DC
  Administered 2019-10-20: 1000 mL

## 2019-10-20 MED ORDER — FLUTICASONE PROPIONATE 50 MCG/ACT NA SUSP
2.0000 | Freq: Two times a day (BID) | NASAL | Status: DC
  Administered 2019-10-20 – 2019-10-23 (×7): 2 via NASAL
  Filled 2019-10-20: qty 16

## 2019-10-20 MED ORDER — OXYMETAZOLINE HCL 0.05 % NA SOLN
1.0000 | Freq: Two times a day (BID) | NASAL | Status: DC
  Administered 2019-10-21 – 2019-10-22 (×4): 1 via NASAL
  Filled 2019-10-20 (×2): qty 15

## 2019-10-20 MED ORDER — FENTANYL CITRATE (PF) 100 MCG/2ML IJ SOLN
INTRAMUSCULAR | Status: AC
  Administered 2019-10-20: 50 ug
  Filled 2019-10-20: qty 2

## 2019-10-20 MED ORDER — ROCURONIUM BROMIDE 10 MG/ML (PF) SYRINGE
PREFILLED_SYRINGE | INTRAVENOUS | Status: AC
  Administered 2019-10-20: 100 mg
  Filled 2019-10-20: qty 10

## 2019-10-20 MED ORDER — ORAL CARE MOUTH RINSE
15.0000 mL | OROMUCOSAL | Status: DC
  Administered 2019-10-20 – 2019-10-29 (×84): 15 mL via OROMUCOSAL

## 2019-10-20 MED ORDER — SALINE SPRAY 0.65 % NA SOLN
1.0000 | Freq: Four times a day (QID) | NASAL | Status: DC | PRN
  Administered 2019-10-20 – 2019-10-22 (×5): 1 via NASAL
  Filled 2019-10-20: qty 44

## 2019-10-20 MED ORDER — PREDNISONE 20 MG PO TABS
20.0000 mg | ORAL_TABLET | Freq: Every day | ORAL | Status: DC
  Administered 2019-10-21: 20 mg via ORAL
  Filled 2019-10-20 (×2): qty 1

## 2019-10-20 MED ORDER — MIDAZOLAM HCL 2 MG/2ML IJ SOLN
2.0000 mg | INTRAMUSCULAR | Status: AC | PRN
  Administered 2019-10-20 – 2019-10-21 (×3): 2 mg via INTRAVENOUS
  Filled 2019-10-20 (×2): qty 2

## 2019-10-20 MED ORDER — MIDAZOLAM HCL 2 MG/2ML IJ SOLN
2.0000 mg | INTRAMUSCULAR | Status: DC | PRN
  Administered 2019-10-21 (×2): 2 mg via INTRAVENOUS
  Filled 2019-10-20 (×3): qty 2

## 2019-10-20 MED ORDER — INSULIN ASPART 100 UNIT/ML ~~LOC~~ SOLN
5.0000 [IU] | Freq: Three times a day (TID) | SUBCUTANEOUS | Status: DC
  Administered 2019-10-20 – 2019-10-21 (×2): 5 [IU] via SUBCUTANEOUS

## 2019-10-20 MED ORDER — ETOMIDATE 2 MG/ML IV SOLN
INTRAVENOUS | Status: AC
  Administered 2019-10-20: 20 mg
  Filled 2019-10-20: qty 10

## 2019-10-20 MED ORDER — FAMOTIDINE IN NACL 20-0.9 MG/50ML-% IV SOLN
20.0000 mg | Freq: Two times a day (BID) | INTRAVENOUS | Status: DC
  Administered 2019-10-20: 20 mg via INTRAVENOUS
  Filled 2019-10-20: qty 50

## 2019-10-20 MED ORDER — PRO-STAT SUGAR FREE PO LIQD
30.0000 mL | Freq: Two times a day (BID) | ORAL | Status: DC
  Administered 2019-10-20 – 2019-10-21 (×2): 30 mL
  Filled 2019-10-20: qty 30

## 2019-10-20 MED ORDER — FENTANYL 2500MCG IN NS 250ML (10MCG/ML) PREMIX INFUSION
50.0000 ug/h | INTRAVENOUS | Status: DC
  Administered 2019-10-20: 50 ug/h via INTRAVENOUS
  Administered 2019-10-21 (×2): 350 ug/h via INTRAVENOUS
  Administered 2019-10-22: 275 ug/h via INTRAVENOUS
  Administered 2019-10-22 (×2): 300 ug/h via INTRAVENOUS
  Administered 2019-10-23: 225 ug/h via INTRAVENOUS
  Administered 2019-10-23 – 2019-10-24 (×3): 175 ug/h via INTRAVENOUS
  Administered 2019-10-25: 225 ug/h via INTRAVENOUS
  Administered 2019-10-25 – 2019-10-26 (×3): 250 ug/h via INTRAVENOUS
  Administered 2019-10-26: 175 ug/h via INTRAVENOUS
  Administered 2019-10-27 (×2): 300 ug/h via INTRAVENOUS
  Administered 2019-10-28: 175 ug/h via INTRAVENOUS
  Administered 2019-10-28 (×2): 300 ug/h via INTRAVENOUS
  Filled 2019-10-20 (×20): qty 250

## 2019-10-20 MED ORDER — FENTANYL CITRATE (PF) 100 MCG/2ML IJ SOLN
50.0000 ug | INTRAMUSCULAR | Status: DC | PRN
  Administered 2019-10-20: 100 ug via INTRAVENOUS
  Filled 2019-10-20: qty 2

## 2019-10-20 MED ORDER — MIDAZOLAM HCL 2 MG/2ML IJ SOLN
INTRAMUSCULAR | Status: AC
  Administered 2019-10-20: 2 mg via INTRAVENOUS
  Filled 2019-10-20: qty 2

## 2019-10-20 MED ORDER — DEXMEDETOMIDINE HCL IN NACL 400 MCG/100ML IV SOLN
0.0000 ug/kg/h | INTRAVENOUS | Status: DC
  Administered 2019-10-20 – 2019-10-21 (×2): 0.4 ug/kg/h via INTRAVENOUS
  Filled 2019-10-20: qty 100

## 2019-10-20 MED ORDER — CHLORHEXIDINE GLUCONATE 0.12% ORAL RINSE (MEDLINE KIT)
15.0000 mL | Freq: Two times a day (BID) | OROMUCOSAL | Status: DC
  Administered 2019-10-20 – 2019-10-29 (×18): 15 mL via OROMUCOSAL

## 2019-10-20 MED ORDER — FENTANYL CITRATE (PF) 100 MCG/2ML IJ SOLN: 50.0000 ug | INTRAMUSCULAR | Status: DC | PRN

## 2019-10-20 MED ORDER — INSULIN ASPART 100 UNIT/ML ~~LOC~~ SOLN
0.0000 [IU] | SUBCUTANEOUS | Status: DC
  Administered 2019-10-20: 8 [IU] via SUBCUTANEOUS
  Administered 2019-10-21: 5 [IU] via SUBCUTANEOUS
  Administered 2019-10-21: 3 [IU] via SUBCUTANEOUS
  Administered 2019-10-21: 5 [IU] via SUBCUTANEOUS
  Administered 2019-10-22: 3 [IU] via SUBCUTANEOUS
  Administered 2019-10-22 (×2): 5 [IU] via SUBCUTANEOUS
  Administered 2019-10-23: 15 [IU] via SUBCUTANEOUS
  Administered 2019-10-23: 2 [IU] via SUBCUTANEOUS
  Administered 2019-10-23: 5 [IU] via SUBCUTANEOUS
  Administered 2019-10-23: 3 [IU] via SUBCUTANEOUS
  Administered 2019-10-23: 11 [IU] via SUBCUTANEOUS
  Administered 2019-10-23: 3 [IU] via SUBCUTANEOUS
  Administered 2019-10-23: 8 [IU] via SUBCUTANEOUS
  Administered 2019-10-24: 5 [IU] via SUBCUTANEOUS
  Administered 2019-10-24 (×2): 3 [IU] via SUBCUTANEOUS
  Administered 2019-10-24: 5 [IU] via SUBCUTANEOUS
  Administered 2019-10-24: 8 [IU] via SUBCUTANEOUS
  Administered 2019-10-25 (×2): 5 [IU] via SUBCUTANEOUS
  Administered 2019-10-25: 3 [IU] via SUBCUTANEOUS
  Administered 2019-10-25: 5 [IU] via SUBCUTANEOUS
  Administered 2019-10-25: 7 [IU] via SUBCUTANEOUS
  Administered 2019-10-25 (×2): 5 [IU] via SUBCUTANEOUS
  Administered 2019-10-26: 8 [IU] via SUBCUTANEOUS
  Administered 2019-10-26: 2 [IU] via SUBCUTANEOUS
  Administered 2019-10-26: 5 [IU] via SUBCUTANEOUS
  Administered 2019-10-26 – 2019-10-27 (×4): 8 [IU] via SUBCUTANEOUS
  Administered 2019-10-27: 3 [IU] via SUBCUTANEOUS
  Administered 2019-10-27: 8 [IU] via SUBCUTANEOUS
  Administered 2019-10-27: 2 [IU] via SUBCUTANEOUS
  Administered 2019-10-27 – 2019-10-28 (×3): 8 [IU] via SUBCUTANEOUS
  Administered 2019-10-28: 5 [IU] via SUBCUTANEOUS
  Administered 2019-10-28: 8 [IU] via SUBCUTANEOUS
  Administered 2019-10-28 (×2): 5 [IU] via SUBCUTANEOUS
  Administered 2019-10-28: 11 [IU] via SUBCUTANEOUS
  Administered 2019-10-29: 5 [IU] via SUBCUTANEOUS
  Administered 2019-10-29: 8 [IU] via SUBCUTANEOUS

## 2019-10-20 NOTE — Progress Notes (Signed)
ANTICOAGULATION CONSULT NOTE - Follow Up Consult  Pharmacy Consult for Lovenox Indication: VTE treatment  Allergies  Allergen Reactions  . Shellfish Allergy Hives    Patient Measurements: Height: 5\' 7"  (170.2 cm) Weight: 193 lb 6.4 oz (87.7 kg) IBW/kg (Calculated) : 66.1  Vital Signs: Temp: 97.8 F (36.6 C) (12/03 0427) Temp Source: Oral (12/03 0427) BP: 130/66 (12/03 0740) Pulse Rate: 73 (12/03 0740)  Labs: Recent Labs    10/18/19 0210 10/19/19 0210 10/19/19 1228 10/20/19 0455  HGB 12.6* 12.9*  --   --   HCT 39.7 41.0  --   --   PLT 411* 395  --   --   HEPRLOWMOCWT  --   --  1.54  --   CREATININE 1.62* 1.52*  --  1.41*    Estimated Creatinine Clearance: 57.4 mL/min (A) (by C-G formula based on SCr of 1.41 mg/dL (H)).   Medications:  Scheduled:  . amLODipine  10 mg Oral Daily  . aspirin EC  81 mg Oral Daily  . atorvastatin  40 mg Oral q1800  . carvedilol  12.5 mg Oral BID WC  . Chlorhexidine Gluconate Cloth  6 each Topical Daily  . dextrose  25 mL Intravenous Once  . enoxaparin (LOVENOX) injection  60 mg Subcutaneous Q12H  . famotidine  20 mg Oral Daily  . feeding supplement (ENSURE ENLIVE)  237 mL Oral TID BM  . feeding supplement (PRO-STAT SUGAR FREE 64)  30 mL Oral TID WC  . insulin aspart  0-15 Units Subcutaneous TID WC  . insulin detemir  20 Units Subcutaneous Daily  . Ipratropium-Albuterol  2 puff Inhalation Q6H  . methylPREDNISolone (SOLU-MEDROL) injection  5.2 mg Intravenous Daily  . multivitamin with minerals  1 tablet Oral Daily  . ticagrelor  90 mg Oral BID  . vitamin B-12  100 mcg Oral Daily  . vitamin C  500 mg Oral Daily  . zinc sulfate  220 mg Oral Daily   Infusions:    Assessment: 74 yoM admitted on 11/16 with COVID-19 pneumonia.  He has been on Lovenox for VTE prophylaxis (BID dosing while in ICU from 11/17-11/21). 11/17 Dopplers negative for DVT 11/22 Dopoplers positive for DVT Pharmacy is now consulted to dose Lovenox treatment  dose for VTE.  - Scr decreased to 1.4, CrCl ~ 57 ml/min. - CBC stable (last on 12/3) - No bleeding reported - Anti-Xa level 1.2  is supra-therapeutic but decreasing (peak drawn 4 after dose given) and SCr is improving.  Goal of Therapy:  Anti-Xa level 0.6-1 units/ml 4hrs after LMWH dose given Monitor platelets by anticoagulation protocol: Yes   Plan:   Continue Lovenox 60 mg [place] q12h  Recheck anti-Xa level as needed.  F/u long-term anticoagulation plan.   Gretta Arab PharmD, BCPS Clinical pharmacist phone 7am- 5pm: [phone] 10/20/2019 7:47 AM

## 2019-10-20 NOTE — Progress Notes (Signed)
Spoke with wife [name], Updated on pt status.

## 2019-10-20 NOTE — Progress Notes (Signed)
Inpatient Diabetes Program Recommendations  AACE/ADA: New Consensus Statement on Inpatient Glycemic Control (2015)  Target Ranges:  Prepandial:   less than 140 mg/dL      Peak postprandial:   less than 180 mg/dL (1-2 hours)      Critically ill patients:  140 - 180 mg/dL   Lab Results  Component Value Date   GLUCAP 469 (H) 10/20/2019   HGBA1C 8.1 (H) 09/21/2019    Review of Glycemic Control Results for William [name], William [name] (MRN [id number]) as of 10/19/2019 13:34  Ref. Range 10/19/2019 08:00 10/19/2019 11:50  Glucose-Capillary Latest Ref Range: 70 - 99 mg/dL 115 (H) 419 (H)   Results for William [name], William [name] (MRN [id number]) as of 10/20/2019 12:34  Ref. Range 10/20/2019 08:38 10/20/2019 12:13  Glucose-Capillary Latest Ref Range: 70 - 99 mg/dL 184 (H) 469 (H)   Diabetes history: DM 2 Outpatient Diabetes medications: Jardiance 25 mg daily, Linagliptin/metformin 2.03/999 mg daily Current orders for Inpatient glycemic control:  Levemir 20 units daily Novolog Moderate tid with meals and HS  Ensure Enlive tid between meals Solumedrol 5.2 mg Daily BUN/Creat:  73/1.52  Inpatient Diabetes Program Recommendations:     Fasting glucose looks good 115, 184 the past 2 mornings.  Patient may benefit from Novolog meal coverage 5-6 units tid with meals (hold if patient eats less than 50%).    Thanks, Tama Headings RN, MSN, BC-ADM Inpatient Diabetes Coordinator Team Pager [phone] (8a-5p)

## 2019-10-20 NOTE — Progress Notes (Signed)
NAME:  William [name], MRN:  [id number], DOB:  [birth year], LOS: 9 ADMISSION DATE:  09/22/2019, CONSULTATION DATE:  12/3 REFERRING MD:  Cathlean Sauer, CHIEF COMPLAINT:  Dyspnea, epistaxis   Brief History   63 y/o male admitted on 11/16 with sever acute respiratory failure with hypoxemia due to COVID 19 pneumonia. He was treated for three weeks with standard therapy, but did not have improvement in oxygenation.  On 12/3 he developed epistaxis and worsening hypoxemia and was moved to the ICU.  History of present illness   63 y/o male admitted on 11/16 with sever acute respiratory failure with hypoxemia due to COVID 19 pneumonia. He was treated for three weeks with standard therapy, but did not have improvement in oxygenation.  On 12/3 he developed epistaxis and worsening hypoxemia and was moved to the ICU.  Past Medical History  Hypertension CAD DM2 NSTEMI  Significant Hospital Events   11/16 admit, treated with heated high flow 11/19 move from PCU 12/3 back to ICU epistaxis  Consults:  PCCM  Procedures:  12/3 ETT >   Significant Diagnostic Tests:  11/22 CT angiogram> neg for PE, extensive pneumonia findings  Micro Data:  11/15 SARS COV 2 > pos 11/16 blood >   Antimicrobials/COVID Rx  11/16 remdesivir > 11/19 11/16 ceftriaxone/azithro x1 11/17 tocilizumab    Interim history/subjective:  As above  Objective   Blood pressure (!) 150/63, pulse 85, temperature 98.2 F (36.8 C), temperature source Axillary, resp. rate (!) 22, height 5\' 7"  (1.702 m), weight 87.7 kg, SpO2 (!) 85 %.    FiO2 (%):  [100 %] 100 %   Intake/Output Summary (Last 24 hours) at 10/20/2019 1755 Last data filed at 10/20/2019 1700 Gross per 24 hour  Intake 2380 ml  Output 3750 ml  Net -1370 ml   Filed Weights   10/18/19 0308 10/19/19 0600 10/20/19 0427  Weight: 88.1 kg 89.1 kg 87.7 kg    Examination:  General:  Tachypnea in bed, anxious HENT: NCAT OP clear PULM: increased effort, no accessory  muscle use CV: tachycadric GI:  soft, nontender MSK: normal bulk and tone Neuro: awake, alert, no distress, MAEW  CXR images personally reviewed  Resolved Hospital Problem list     Assessment & Plan:  Epistaxis in setting of prolonged hypoxemia from COVID 19 pneumonia.  No acutely worsening hypoxemia with epistaxis.  I explained to him that I have seen patients this late in the illness survive acute decompensation from epistaxis, but it has uniformly required mechanical ventilation.  In cases where we have not used mechanical ventilation patients have not survived.  I also explained that mechanical ventilation will not be brief, and he may require weeks on a ventilator.  He voiced understanding and agreed to mechanical ventilation. > proceed with intubation > initiate mechanical ventilation, ARDS protocol > PAD protocol, RASS score -2 > pack nose with rhino rocket > afrin > VAP prevention   Best practice:  Diet: tube feeding Pain/Anxiety/Delirium protocol (if indicated): yes, RASS goal -2 VAP protocol (if indicated): yes DVT prophylaxis: hold anticoagulants GI prophylaxis: famotidine Glucose control: per TRH Mobility: bed rest Code Status: change to full code Family Communication: updated wife bedside Disposition: remain in ICU  Labs   CBC: Recent Labs  Lab 10/15/19 0203 10/16/19 0355 10/17/19 0224 10/18/19 0210 10/19/19 0210  WBC 17.6* 14.5* 13.2* 14.5* 13.1*  NEUTROABS 16.9* 13.2* 12.2* 13.3* 11.8*  HGB 13.0 13.0 12.6* 12.6* 12.9*  HCT 41.4 40.9 39.3 39.7 41.0  MCV 79.2* 78.7*  78.0* 78.9* 79.6*  PLT 470* 451* 426* 411* [id number]    Basic Metabolic Panel: Recent Labs  Lab 10/14/19 0320 10/15/19 0203 10/16/19 0355 10/17/19 0224 10/18/19 0210 10/19/19 0210 10/20/19 0455  NA 142 143 140 141 140 140 135  K 5.1 4.9 4.9 4.5 4.3 4.2 4.4  CL 108 108 107 104 102 101 99  CO2 24 25 25 26 25 26 25   GLUCOSE 77 62* 44* 214* 180* 50* 179*  BUN 33* 38* 48* 68* 80* 73* 70*   CREATININE 1.16 1.31* 1.25* 1.43* 1.62* 1.52* 1.41*  CALCIUM 8.8* 8.7* 8.6* 8.9 9.1 9.2 8.8*  MG 2.6* 2.8* 2.5* 2.8* 2.9* 2.7*  --   PHOS 3.8 4.4  --   --   --   --   --    GFR: Estimated Creatinine Clearance: 57.4 mL/min (A) (by C-G formula based on SCr of 1.41 mg/dL (H)). Recent Labs  Lab 10/16/19 0355 10/17/19 0224 10/18/19 0210 10/18/19 0730 10/19/19 0210  PROCALCITON 0.14 0.21  --  0.27 0.18  WBC 14.5* 13.2* 14.5*  --  13.1*    Liver Function Tests: Recent Labs  Lab 10/15/19 0203 10/16/19 0355 10/17/19 0224 10/18/19 0210 10/19/19 0210  AST 40 39 33 37 36  ALT 51* 51* 56* 55* 52*  ALKPHOS 153* 127* 127* 129* 130*  BILITOT 0.7 0.9 0.6 0.3 0.5  PROT 6.8 6.4* 6.4* 6.6 6.8  ALBUMIN 2.9* 2.7* 2.8* 2.7* 2.7*   No results for input(s): LIPASE, AMYLASE in the last 168 hours. No results for input(s): AMMONIA in the last 168 hours.  ABG No results found for: PHART, PCO2ART, PO2ART, HCO3, TCO2, ACIDBASEDEF, O2SAT   Coagulation Profile: No results for input(s): INR, PROTIME in the last 168 hours.  Cardiac Enzymes: No results for input(s): CKTOTAL, CKMB, CKMBINDEX, TROPONINI in the last 168 hours.  HbA1C: Hgb A1c MFr Bld  Date/Time Value Ref Range Status  09/21/2019 01:40 PM 8.1 (H) 4.8 - 5.6 % Final    Comment:    REPEATED TO VERIFY (NOTE) Pre diabetes:          5.7%-6.4% Diabetes:              >6.4% Glycemic control for   <7.0% adults with diabetes   01/09/2014 05:10 AM 8.9 (H) <5.7 % Final    Comment:    (NOTE)                                                                       According to the ADA Clinical Practice Recommendations for 2011, when HbA1c is used as a screening test:  >=6.5%   Diagnostic of Diabetes Mellitus           (if abnormal result is confirmed) 5.7-6.4%   Increased risk of developing Diabetes Mellitus References:Diagnosis and Classification of Diabetes Mellitus,Diabetes [id number] 1):S62-S69 and Standards of Medical Care in          Diabetes - 2011,Diabetes [id number] (Suppl 1):S11-S61.    CBG: Recent Labs  Lab 10/19/19 1608 10/19/19 2026 10/20/19 0838 10/20/19 1213 10/20/19 1548  GLUCAP 321* 267* 184* 469* 380*    Review of Systems:   Cannot obtain  Past Medical History  He,  has a past medical history  of CAD (coronary artery disease), Diabetes mellitus ([place]) (2015), H/O non-ST elevation myocardial infarction (NSTEMI) (12/2013), echocardiogram, and Hypertension.   Surgical History    Past Surgical History:  Procedure Laterality Date  . CORONARY ANGIOPLASTY WITH STENT PLACEMENT  Feb 2015   proxLAD - 3.0x16DES, midLAD -3.0x12 DES, CFX - 2.5x16 DES  . LEFT HEART CATHETERIZATION WITH CORONARY ANGIOGRAM N/A 01/09/2014   Procedure: LEFT HEART CATHETERIZATION WITH CORONARY ANGIOGRAM;  Surgeon: Jettie Booze, MD;  Location: [facility] CATH LAB;  Service: Cardiovascular;  Laterality: N/A;     Social History   reports that he has never smoked. He has never used smokeless tobacco. He reports current alcohol use.   Family History   His family history is not on file.   Allergies Allergies  Allergen Reactions  . Shellfish Allergy Hives     Home Medications  Prior to Admission medications   Medication Sig Start Date End Date Taking? Authorizing Provider  acetaminophen (TYLENOL) 500 MG tablet Take 1,000 mg by mouth every 6 (six) hours as needed for headache (pain).   Yes [provider]  amLODIPine-Valsartan-HCTZ (EXFORGE HCT) 5-160-12.5 MG TABS Take 1 tablet by mouth daily.   Yes [provider]  aspirin EC 81 MG tablet Take 1 tablet (81 mg total) by mouth daily. 01/10/14  Yes Barrett, Evelene Croon, PA-C  BRILINTA 90 MG TABS tablet take 1 tablet by mouth twice a day Patient taking differently: Take 90 mg by mouth daily.  07/24/15  Yes Jerline Pain, MD  Chlorphen-PE-Acetaminophen (NOREL AD PO) Take 1 tablet by mouth every 6 (six) hours as needed (congestion, cough).   Yes [provider]  doxazosin (CARDURA) 4 MG tablet Take 4 mg by mouth at bedtime.    Yes [provider]  empagliflozin (JARDIANCE) 25 MG TABS tablet Take 25 mg by mouth daily.   Yes [provider]  linaGLIPtin-metFORMIN HCl (JENTADUETO) 2.03-999 MG TABS Take 2 tablets by mouth daily.   Yes [provider]  spironolactone (ALDACTONE) 25 MG tablet take 1 tablet by mouth once daily Patient taking differently: Take 25 mg by mouth daily.  04/09/15  Yes Jerline Pain, MD  Tetrahydrozoline HCl (VISINE OP) Place 1 drop into both eyes daily as needed (dry eyes/irritation).   Yes [provider]  atorvastatin (LIPITOR) 40 MG tablet take 1 tablet by mouth once daily AT 6PM Patient not taking: Reported on 09/19/2019 06/04/16   Jerline Pain, MD  metoprolol tartrate (LOPRESSOR) 25 MG tablet Take 1 tablet (25 mg total) by mouth 2 (two) times daily. Patient not taking: Reported on 09/30/2019 06/04/16   Jerline Pain, MD  nitroGLYCERIN (NITROSTAT) 0.4 MG SL tablet Place 1 tablet (0.4 mg total) under the tongue every 5 (five) minutes as needed for chest pain. Patient not taking: Reported on 09/19/2019 01/10/14   Barrett, Evelene Croon, PA-C     Critical care time: 40 minutes     [name], MD [place] [facility]M Pager: [phone] Cell: [phone] If no response, call [phone]

## 2019-10-20 NOTE — Progress Notes (Signed)
Pt began to desat down to the 70%, another nurse assisted me in the room and we noticed that pt was now bleeding from both his nose and mouth. We then asked Dr. Cathlean Sauer to come see pt at bedside. Prior to the doctor's  arrival we called a rapid response because the patient's BP and HR dropped as he was moving bacv to bed.  When Dr. Cathlean Sauer was at bedside he assessed the pt and decided that the patient needed to be upgraded to ICU. I gave bedside shift report to [facility], Therapist, sports. MD noted that he spoke to patient's wife.

## 2019-10-20 NOTE — Progress Notes (Signed)
[place] Progress Note Patient Name: William [name] DOB: 1956-04-19 MRN: [id number]   Date of Service  10/20/2019  HPI/Events of Note  Patient with episodes of ventilator desynchrony despite prn Fentanyl and prn Versed. Also on Precedex drip  eICU Interventions  Ordered to start Fentanyl drip     Intervention Category Minor Interventions: Agitation / anxiety - evaluation and management  Judd Lien 10/20/2019, 8:35 PM

## 2019-10-20 NOTE — Progress Notes (Addendum)
PROGRESS NOTE    William [name]  [id number] DOB: 1955/11/21 DOA: 09/27/2019 PCP: [name], MD    Brief Narrative:  63 year old male who presented with dyspnea.  He does have significant past medical history for hypertension, dyslipidemia, coronary disease, type II days mellitus, and history of DVT.  Patient was diagnosed with COVID-19 prior to hospitalization.  Patient noticed dry cough and dyspnea, he initially presented to [facility] emergency department but left AMA, due to concerns about insurance coverage.  Due to persistent symptoms he returned the following day, his oximetry was 80% on room air, pressure 124/59, pulse rate 81, respiratory rate 34, oxygen saturation 96% on supplemental oxygen, nonrebreather 15 LPM.  Patient had increased work of breathing, no wheezing, no rales, heart S1-S2 present rhythmic, abdomen soft, no lower extremity edema.  His chest radiograph had bilateral interstitial infiltrates, predominantly left lower lobe and right upper lobe.  EKG 83 bpm, normal axis, first-degree AV block, sinus rhythm, no ST segment or T wave changes.  Patient was admitted to the hospital with a working diagnosis of acute hypoxic respiratory failure due to SARS COVID-19 viral pneumonia.   He initially had high oxygen requirements, required heated high flow nasal cannula.  Patient has been treated with systemic corticosteroids, remdesivir, 1 dose of Actemra and 1 infusion of convalescent plasma.   Assessment & Plan:   Active Problems:   Pneumonia due to COVID-19 virus   Diabetes mellitus type 2, uncontrolled, with complications (HCC)   Chronic diastolic CHF (congestive heart failure) (HCC)   Hyperkalemia   Acute renal failure superimposed on stage 3a chronic kidney disease (HCC)   Acute respiratory failure with hypoxia (HCC)   Essential hypertension   Protein-calorie malnutrition, severe   1. Acute hypoxic respiratory failure due to SARS COVID 19 viral pneumonia.  Patient continue to have very high oxygen requirements. CT chest negative for pulmonary embolism.   RR: 21  Pulse oxymetry: 90%  Fi02: 15 LPM HFNC plus NRB mask, 100%   Patient continue to have high oxygen requirements, will continue with antitussive agents, encourage prone position, airway clearing techniques with incentive spirometer and flutter valve. Continue bronchodilator therapy and physical therapy.  Will check inflammatory markers in am, and follow up chest radiograph.   Patient continue to be at high risk for worsening respiratory failure.   2. AKI on CKD stage 3a. Stable renal function with serum cr at 1,41, K at 4,4 and serum bicarbonate at 25. Poor oral intake. Continue close follow up of renal function and electrolytes.    3. HTN. On amlodipine and carvedilol, for blood pressure control.   4. CAD and Dyslipidemia. On asa and ticagrelor, with good toleration.   5. DVT acute. Enoxaparin for anticoagulation, if continue good toleration will transition to oral anticoagulation in the next 24 H.   6. Obesity. His BMI is 30,7.   7. T2DM with steroids induced hyperglycemia. Fasting glucose is 179 this am, his capillary glucose has been  184 and 469, will continue basal insulin 20 units and will add meal coverage with 5 units, continue with insulin sliding scale for glucose cover and monitoring.   8. Protein calorie protein malnutrition (present on admission), severe. Continue with nutritional supplements.   DVT prophylaxis: enoxaparin   Code Status:  Partial  Family Communication: no family at the bedside  Disposition Plan/ discharge barriers: pending clinical improvement.    Body mass index is 30.29 kg/m. Malnutrition Type:  Nutrition Problem: Severe Malnutrition Etiology: acute illness(COVID-19)  Malnutrition Characteristics:  Signs/Symptoms: energy intake < or equal to 50% for > or equal to 5 days, percent weight loss(3% weight loss within 1 week)  Percent weight loss: 3 %(3% weight loss within one week)   Nutrition Interventions:  Interventions: Ensure Enlive (each supplement provides 350kcal and 20 grams of protein), MVI, Magic cup  RN Pressure Injury Documentation:    Subjective: Patient continue to have high oxygen requirements, out of bed to the chair, persistent dyspnea. Positive congested nose.   Objective: Vitals:   10/20/19 0003 10/20/19 0427 10/20/19 0740 10/20/19 0810  BP: 113/66 120/68 130/66   Pulse: 61 71 73   Resp: 20 (!) 22  (!) 21  Temp: 98.5 F (36.9 C) 97.8 F (36.6 C)  (!) 97.4 F (36.3 C)  TempSrc: Axillary Oral  Axillary  SpO2: 92% 90%    Weight:  87.7 kg    Height:        Intake/Output Summary (Last 24 hours) at 10/20/2019 0905 Last data filed at 10/20/2019 [id number] Gross per 24 hour  Intake 940 ml  Output 3425 ml  Net -2485 ml   Filed Weights   10/18/19 0308 10/19/19 0600 10/20/19 0427  Weight: 88.1 kg 89.1 kg 87.7 kg    Examination:   General: Not in pain or dyspnea, deconditioned  Neurology: Awake and alert, non focal  E ENT: mild pallor, no icterus, oral mucosa moist Cardiovascular: No JVD. S1-S2 present, rhythmic, no gallops, rubs, or murmurs. No lower extremity edema. Pulmonary: positive breath sounds bilaterally. Gastrointestinal. Abdomen with no organomegaly, non tender, no rebound or guarding Skin. No rashes Musculoskeletal: no joint deformities     Data Reviewed: I have personally reviewed following labs and imaging studies  CBC: Recent Labs  Lab 10/15/19 0203 10/16/19 0355 10/17/19 0224 10/18/19 0210 10/19/19 0210  WBC 17.6* 14.5* 13.2* 14.5* 13.1*  NEUTROABS 16.9* 13.2* 12.2* 13.3* 11.8*  HGB 13.0 13.0 12.6* 12.6* 12.9*  HCT 41.4 40.9 39.3 39.7 41.0  MCV 79.2* 78.7* 78.0* 78.9* 79.6*  PLT 470* 451* 426* 411* [id number]   Basic Metabolic Panel: Recent Labs  Lab 10/14/19 0320 10/15/19 0203 10/16/19 0355 10/17/19 0224 10/18/19 0210 10/19/19 0210 10/20/19 0455   NA 142 143 140 141 140 140 135  K 5.1 4.9 4.9 4.5 4.3 4.2 4.4  CL 108 108 107 104 102 101 99  CO2 24 25 25 26 25 26 25   GLUCOSE 77 62* 44* 214* 180* 50* 179*  BUN 33* 38* 48* 68* 80* 73* 70*  CREATININE 1.16 1.31* 1.25* 1.43* 1.62* 1.52* 1.41*  CALCIUM 8.8* 8.7* 8.6* 8.9 9.1 9.2 8.8*  MG 2.6* 2.8* 2.5* 2.8* 2.9* 2.7*  --   PHOS 3.8 4.4  --   --   --   --   --    GFR: Estimated Creatinine Clearance: 57.4 mL/min (A) (by C-G formula based on SCr of 1.41 mg/dL (H)). Liver Function Tests: Recent Labs  Lab 10/15/19 0203 10/16/19 0355 10/17/19 0224 10/18/19 0210 10/19/19 0210  AST 40 39 33 37 36  ALT 51* 51* 56* 55* 52*  ALKPHOS 153* 127* 127* 129* 130*  BILITOT 0.7 0.9 0.6 0.3 0.5  PROT 6.8 6.4* 6.4* 6.6 6.8  ALBUMIN 2.9* 2.7* 2.8* 2.7* 2.7*   No results for input(s): LIPASE, AMYLASE in the last 168 hours. No results for input(s): AMMONIA in the last 168 hours. Coagulation Profile: No results for input(s): INR, PROTIME in the last 168 hours. Cardiac Enzymes: No results for input(s): CKTOTAL,  CKMB, CKMBINDEX, TROPONINI in the last 168 hours. BNP (last 3 results) No results for input(s): PROBNP in the last 8760 hours. HbA1C: No results for input(s): HGBA1C in the last 72 hours. CBG: Recent Labs  Lab 10/19/19 0800 10/19/19 1150 10/19/19 1608 10/19/19 2026 10/20/19 0838  GLUCAP 115* 419* 321* 267* 184*   Lipid Profile: No results for input(s): CHOL, HDL, LDLCALC, TRIG, CHOLHDL, LDLDIRECT in the last 72 hours. Thyroid Function Tests: No results for input(s): TSH, T4TOTAL, FREET4, T3FREE, THYROIDAB in the last 72 hours. Anemia Panel: No results for input(s): VITAMINB12, FOLATE, FERRITIN, TIBC, IRON, RETICCTPCT in the last 72 hours.    Radiology Studies: I have reviewed all of the imaging during this hospital visit personally     Scheduled Meds: . amLODipine  10 mg Oral Daily  . aspirin EC  81 mg Oral Daily  . atorvastatin  40 mg Oral q1800  . carvedilol  12.5  mg Oral BID WC  . Chlorhexidine Gluconate Cloth  6 each Topical Daily  . dextrose  25 mL Intravenous Once  . enoxaparin (LOVENOX) injection  60 mg Subcutaneous Q12H  . famotidine  20 mg Oral Daily  . feeding supplement (ENSURE ENLIVE)  237 mL Oral TID BM  . feeding supplement (PRO-STAT SUGAR FREE 64)  30 mL Oral TID WC  . insulin aspart  0-15 Units Subcutaneous TID WC  . insulin detemir  20 Units Subcutaneous Daily  . Ipratropium-Albuterol  2 puff Inhalation Q6H  . methylPREDNISolone (SOLU-MEDROL) injection  5.2 mg Intravenous Daily  . multivitamin with minerals  1 tablet Oral Daily  . ticagrelor  90 mg Oral BID  . vitamin B-12  100 mcg Oral Daily  . vitamin C  500 mg Oral Daily  . zinc sulfate  220 mg Oral Daily   Continuous Infusions:   LOS: 17 days        Zaidan Keeble Gerome Apley, MD

## 2019-10-20 NOTE — Progress Notes (Signed)
Pt's wife FaceTimed to speak with pt regarding his decision to be intubated. Dr Lake Bells, RT, and several RNs at bedside. All pt's and pt's wife's questioned answered by RN.

## 2019-10-20 NOTE — Progress Notes (Signed)
Physical Therapy Treatment Patient Details Name: William [name] MRN: [id number] DOB: 07/18/1956 Today's Date: 10/20/2019    History of Present Illness Patient is a 63 y.o. male with PMHx of HTN, dyslipidemia, DM-2, CAD-who presented with shortness of breath and cough-he was found to have acute hypoxic respiratory failure secondary to COVID-19 pneumonia.    PT Comments     Patient reclined in chair, SPO2 82% on partial NRM and 15 L HFNC. Patient mearly sat upright in recliner with  Feet dependent. SPO2 rapidly decreased to 56%. RN came  To room, Satting in 49's. Noted NRB did not have vent flap in place. Obtained a new NRB with vent flap in place.. Over 5 minutes to gradually increase SPO2 to 77%. RN remained in room. Unfortunately, patient is not able to progress due to severe desaturation. Probe on finger. Patient with notable respiratory distress with Abd. Breathing, shallow and rapid, RR rate in high 30's     Follow Up Recommendations  Other (comment)     Equipment Recommendations  None recommended by PT    Recommendations for Other Services       Precautions / Restrictions Precautions Precaution Comments: sats drop quickly,on NRB and HFNC    Mobility  Bed Mobility               General bed mobility comments: patient in recliner, feet elevated. SPO2 82% on partial NRB and 15 l HFNC. placed feet down, sat upright with SPO2 dropping to 56%. RN notified and came to room. replaced NRB with a new one that had one vent flap in place. salowly increased SPO2 to 77%-5 minutes.  Transfers                    Ambulation/Gait                 Stairs             Wheelchair Mobility    Modified Rankin (Stroke Patients Only)       Balance                                            Cognition Arousal/Alertness: Awake/alert Behavior During Therapy: Anxious                                          Exercises       General Comments        Pertinent Vitals/Pain Pain Assessment: No/denies pain    Home Living                      Prior Function            PT Goals (current goals can now be found in the care plan section) Progress towards PT goals: Not progressing toward goals - comment(o2 saturation very low.)    Frequency    Min 3X/week      PT Plan Current plan remains appropriate;Other (comment)    Co-evaluation              AM-PAC PT "6 Clicks" Mobility   Outcome Measure  Help needed turning from your back to your side while in a flat bed without using bedrails?: None Help needed moving from lying on your back  to sitting on the side of a flat bed without using bedrails?: None Help needed moving to and from a bed to a chair (including a wheelchair)?: A Little Help needed standing up from a chair using your arms (e.g., wheelchair or bedside chair)?: A Little Help needed to walk in hospital room?: A Lot Help needed climbing 3-5 steps with a railing? : A Lot 6 Click Score: 18    End of Session Equipment Utilized During Treatment: Oxygen Activity Tolerance: Treatment limited secondary to medical complications (Comment) Patient left: in chair;with call bell/phone within reach;with nursing/sitter in room Nurse Communication: Other (comment)(desaturation to 50's) PT Visit Diagnosis: Unsteadiness on feet (R26.81);Difficulty in walking, not elsewhere classified (R26.2)     Time: [id number] PT Time Calculation (min) (ACUTE ONLY): 22 min  Charges:  $Therapeutic Activity: 8-22 mins                     Tresa Endo PT Acute Rehabilitation Services  Office [phone]    Claretha Cooper 10/20/2019, 4:17 PM

## 2019-10-20 NOTE — Plan of Care (Signed)

## 2019-10-20 NOTE — Progress Notes (Addendum)
2100: spoke with wife and updated her on patient status  Patient continues to have ongoing epistaxis (patient states this has been ongoing since before he left the ICU). After clearing dried blood from nostrils with saline spray and cotton tipped swabs, pt noted to blow large clots out of nose. Again states this has been going on for days. WCTM

## 2019-10-20 NOTE — Progress Notes (Addendum)
Patient worked with PT and did very little before reaching exertion and O2 dropping down to 59%. He took about 15-20 mins to recover staying between 75- 83%. RT was called and a page was sent out to the doctor. The patient said that he did not feel short of breath when he was asked.  Dr. Cathlean Sauer put in for a STAT chest Xray. Pt is currently resting at 86-89%. Will cont to monitor.

## 2019-10-20 NOTE — Progress Notes (Addendum)
Patient had significant upper airway bleeding, his oxygen saturation had dropped down to the 60s, he became bradycardic and having premature ventricular complexes on the telemetry monitor.  Evidently more dyspneic and tired.  The bleeding seems to be coming from his nose.  High flow nasal cannula was placed in his mouth, with mouth breathing his oxygen saturation has come up to 92%, with rapid oxygen desaturation with movement.  Stat chest radiograph, personally reviewed showing persistent bilateral interstitial infiltrates, predominantly right upper lobe left lower lobe.   Worsening acute hypoxic respiratory failure.  1.  Continue supplemental oxygen per heated high flow nasal cannula per mouth and if tolerated transition to nose prompts, target oxygen saturation more than 88%. 2.  Transfer patient to intensive care unit for close monitoring. 3.  Continue nasal sprays with Flonase, Afrin and saline.. 4.  Hold for tonight enoxaparin, with reassessment in the morning.  Patient critically ill, will continue supportive medical therapy to prevent imminent deterioration.  Critical care time 60 minutes  I have talked to the patient's wife and the patient himself, considering risk of recurrent bleeding and possible obstruction of thr upper airway he is agreeing to have mechanical ventilation to bypass the possible upper airway obstruction in case it is needed.  Code has been changed to full code.

## 2019-10-21 ENCOUNTER — Inpatient Hospital Stay (HOSPITAL_COMMUNITY): Payer: Commercial Managed Care - PPO

## 2019-10-21 DIAGNOSIS — U071 COVID-19: Secondary | ICD-10-CM | POA: Diagnosis not present

## 2019-10-21 DIAGNOSIS — N179 Acute kidney failure, unspecified: Secondary | ICD-10-CM | POA: Diagnosis not present

## 2019-10-21 DIAGNOSIS — L8990 Pressure ulcer of unspecified site, unspecified stage: Secondary | ICD-10-CM | POA: Insufficient documentation

## 2019-10-21 DIAGNOSIS — I5032 Chronic diastolic (congestive) heart failure: Secondary | ICD-10-CM | POA: Diagnosis not present

## 2019-10-21 DIAGNOSIS — J9601 Acute respiratory failure with hypoxia: Secondary | ICD-10-CM | POA: Diagnosis not present

## 2019-10-21 LAB — COMPREHENSIVE METABOLIC PANEL
ALT: 36 U/L (ref 0–44)
AST: 21 U/L (ref 15–41)
Albumin: 2.5 g/dL — ABNORMAL LOW (ref 3.5–5.0)
Alkaline Phosphatase: 133 U/L — ABNORMAL HIGH (ref 38–126)
Anion gap: 11 (ref 5–15)
BUN: 80 mg/dL — ABNORMAL HIGH (ref 8–23)
CO2: 27 mmol/L (ref 22–32)
Calcium: 8.6 mg/dL — ABNORMAL LOW (ref 8.9–10.3)
Chloride: 102 mmol/L (ref 98–111)
Creatinine, Ser: 1.53 mg/dL — ABNORMAL HIGH (ref 0.61–1.24)
GFR calc Af Amer: 56 mL/min — ABNORMAL LOW (ref >60)
GFR calc non Af Amer: 48 mL/min — ABNORMAL LOW (ref >60)
Glucose, Bld: 157 mg/dL — ABNORMAL HIGH (ref 70–99)
Potassium: 4.5 mmol/L (ref 3.5–5.1)
Sodium: 140 mmol/L (ref 135–145)
Total Bilirubin: 0.6 mg/dL (ref 0.3–1.2)
Total Protein: 6.4 g/dL — ABNORMAL LOW (ref 6.5–8.1)

## 2019-10-21 LAB — POCT I-STAT 7, (LYTES, BLD GAS, ICA,H+H)
Acid-Base Excess: 1 mmol/L (ref 0.0–2.0)
Bicarbonate: 30 mmol/L — ABNORMAL HIGH (ref 20.0–28.0)
Calcium, Ion: 1.31 mmol/L (ref 1.15–1.40)
HCT: 34 % — ABNORMAL LOW (ref 39.0–52.0)
Hemoglobin: 11.6 g/dL — ABNORMAL LOW (ref 13.0–17.0)
O2 Saturation: 86 %
Patient temperature: 98.9
Potassium: 5.3 mmol/L — ABNORMAL HIGH (ref 3.5–5.1)
Sodium: 141 mmol/L (ref 135–145)
TCO2: 32 mmol/L (ref 22–32)
pCO2 arterial: 68.4 mmHg (ref 32.0–48.0)
pH, Arterial: 7.251 — ABNORMAL LOW (ref 7.350–7.450)
pO2, Arterial: 62 mmHg — ABNORMAL LOW (ref 83.0–108.0)

## 2019-10-21 LAB — PHOSPHORUS
Phosphorus: 5 mg/dL — ABNORMAL HIGH (ref 2.5–4.6)
Phosphorus: 7.1 mg/dL — ABNORMAL HIGH (ref 2.5–4.6)

## 2019-10-21 LAB — MAGNESIUM
Magnesium: 3 mg/dL — ABNORMAL HIGH (ref 1.7–2.4)
Magnesium: 3.2 mg/dL — ABNORMAL HIGH (ref 1.7–2.4)

## 2019-10-21 LAB — GLUCOSE, CAPILLARY
Glucose-Capillary: 139 mg/dL — ABNORMAL HIGH (ref 70–99)
Glucose-Capillary: 154 mg/dL — ABNORMAL HIGH (ref 70–99)
Glucose-Capillary: 189 mg/dL — ABNORMAL HIGH (ref 70–99)
Glucose-Capillary: 225 mg/dL — ABNORMAL HIGH (ref 70–99)
Glucose-Capillary: 226 mg/dL — ABNORMAL HIGH (ref 70–99)

## 2019-10-21 LAB — CBC
HCT: 36.9 % — ABNORMAL LOW (ref 39.0–52.0)
Hemoglobin: 11.7 g/dL — ABNORMAL LOW (ref 13.0–17.0)
MCH: 25.3 pg — ABNORMAL LOW (ref 26.0–34.0)
MCHC: 31.7 g/dL (ref 30.0–36.0)
MCV: 79.7 fL — ABNORMAL LOW (ref 80.0–100.0)
Platelets: 274 10*3/uL (ref 150–400)
RBC: 4.63 MIL/uL (ref 4.22–5.81)
RDW: 15.4 % (ref 11.5–15.5)
WBC: 14 10*3/uL — ABNORMAL HIGH (ref 4.0–10.5)
nRBC: 0.1 % (ref 0.0–0.2)

## 2019-10-21 LAB — C-REACTIVE PROTEIN: CRP: 0.7 mg/dL (ref <1.0)

## 2019-10-21 LAB — FERRITIN: Ferritin: 260 ng/mL (ref 24–336)

## 2019-10-21 LAB — D-DIMER, QUANTITATIVE: D-Dimer, Quant: 2.41 ug/mL-FEU — ABNORMAL HIGH (ref 0.00–0.50)

## 2019-10-21 MED ORDER — INSULIN ASPART 100 UNIT/ML ~~LOC~~ SOLN
2.0000 [IU] | SUBCUTANEOUS | Status: DC
  Administered 2019-10-21 – 2019-10-26 (×30): 2 [IU] via SUBCUTANEOUS

## 2019-10-21 MED ORDER — PREDNISONE 5 MG PO TABS
5.0000 mg | ORAL_TABLET | Freq: Every day | ORAL | Status: AC
  Administered 2019-10-24: 5 mg via ORAL
  Filled 2019-10-21: qty 1

## 2019-10-21 MED ORDER — FAMOTIDINE 40 MG/5ML PO SUSR
20.0000 mg | Freq: Every day | ORAL | Status: DC
  Administered 2019-10-21 – 2019-10-27 (×7): 20 mg
  Filled 2019-10-21 (×7): qty 2.5

## 2019-10-21 MED ORDER — PREDNISONE 5 MG PO TABS
15.0000 mg | ORAL_TABLET | Freq: Every day | ORAL | Status: AC
  Administered 2019-10-22: 15 mg via ORAL
  Filled 2019-10-21 (×2): qty 1

## 2019-10-21 MED ORDER — BISACODYL 10 MG RE SUPP
10.0000 mg | Freq: Every day | RECTAL | Status: DC | PRN
  Filled 2019-10-21: qty 1

## 2019-10-21 MED ORDER — CLONAZEPAM 1 MG PO TABS
1.0000 mg | ORAL_TABLET | Freq: Two times a day (BID) | ORAL | Status: DC
  Administered 2019-10-21 – 2019-10-26 (×10): 1 mg
  Filled 2019-10-21 (×11): qty 1

## 2019-10-21 MED ORDER — ATORVASTATIN CALCIUM 40 MG PO TABS
40.0000 mg | ORAL_TABLET | Freq: Every day | ORAL | Status: DC
  Administered 2019-10-21 – 2019-10-28 (×8): 40 mg
  Filled 2019-10-21 (×9): qty 1

## 2019-10-21 MED ORDER — ADULT MULTIVITAMIN LIQUID CH
15.0000 mL | Freq: Every day | ORAL | Status: DC
  Administered 2019-10-22 – 2019-10-28 (×7): 15 mL
  Filled 2019-10-21 (×7): qty 15

## 2019-10-21 MED ORDER — OXYCODONE HCL 5 MG/5ML PO SOLN
5.0000 mg | Freq: Four times a day (QID) | ORAL | Status: DC
  Administered 2019-10-21: 5 mg via ORAL
  Filled 2019-10-21: qty 5

## 2019-10-21 MED ORDER — DOCUSATE SODIUM 50 MG/5ML PO LIQD
100.0000 mg | Freq: Two times a day (BID) | ORAL | Status: DC | PRN
  Administered 2019-10-23: 100 mg
  Filled 2019-10-21: qty 10

## 2019-10-21 MED ORDER — FAMOTIDINE IN NACL 20-0.9 MG/50ML-% IV SOLN: 20.0000 mg | INTRAVENOUS | Status: DC

## 2019-10-21 MED ORDER — MIDAZOLAM 50MG/50ML (1MG/ML) PREMIX INFUSION
0.0000 mg/h | INTRAVENOUS | Status: DC
  Administered 2019-10-21: 2 mg/h via INTRAVENOUS
  Administered 2019-10-21 (×2): 6 mg/h via INTRAVENOUS
  Administered 2019-10-22: 4 mg/h via INTRAVENOUS
  Administered 2019-10-22: 5 mg/h via INTRAVENOUS
  Administered 2019-10-23 (×2): 3 mg/h via INTRAVENOUS
  Administered 2019-10-24: 3.5 mg/h via INTRAVENOUS
  Administered 2019-10-25: 5 mg/h via INTRAVENOUS
  Administered 2019-10-25: 4.5 mg/h via INTRAVENOUS
  Administered 2019-10-25: 5 mg/h via INTRAVENOUS
  Administered 2019-10-26: 4 mg/h via INTRAVENOUS
  Administered 2019-10-26: 3 mg/h via INTRAVENOUS
  Administered 2019-10-27 – 2019-10-28 (×4): 6 mg/h via INTRAVENOUS
  Administered 2019-10-28: 5 mg/h via INTRAVENOUS
  Administered 2019-10-29: 4 mg/h via INTRAVENOUS
  Filled 2019-10-21 (×20): qty 50

## 2019-10-21 MED ORDER — CARVEDILOL 3.125 MG PO TABS
3.1250 mg | ORAL_TABLET | Freq: Two times a day (BID) | ORAL | Status: DC
  Administered 2019-10-22 – 2019-10-28 (×11): 3.125 mg
  Filled 2019-10-21 (×18): qty 1

## 2019-10-21 MED ORDER — PREDNISONE 2.5 MG PO TABS
2.5000 mg | ORAL_TABLET | Freq: Every day | ORAL | Status: AC
  Administered 2019-10-25: 2.5 mg via ORAL
  Filled 2019-10-21: qty 1

## 2019-10-21 MED ORDER — OXYCODONE HCL 5 MG/5ML PO SOLN
5.0000 mg | Freq: Four times a day (QID) | ORAL | Status: DC
  Administered 2019-10-21 – 2019-10-26 (×19): 5 mg
  Filled 2019-10-21 (×20): qty 5

## 2019-10-21 MED ORDER — CLONAZEPAM 0.1 MG/ML ORAL SUSPENSION
1.0000 mg | Freq: Two times a day (BID) | ORAL | Status: DC
  Filled 2019-10-21 (×2): qty 10

## 2019-10-21 MED ORDER — CARVEDILOL 3.125 MG PO TABS
3.1250 mg | ORAL_TABLET | Freq: Two times a day (BID) | ORAL | Status: DC
  Filled 2019-10-21 (×2): qty 1

## 2019-10-21 MED ORDER — MIDAZOLAM BOLUS VIA INFUSION
1.0000 mg | INTRAVENOUS | Status: DC | PRN
  Filled 2019-10-21: qty 2

## 2019-10-21 MED ORDER — PREDNISONE 10 MG PO TABS
10.0000 mg | ORAL_TABLET | Freq: Every day | ORAL | Status: AC
  Administered 2019-10-23: 10 mg via ORAL
  Filled 2019-10-21: qty 1

## 2019-10-21 MED ORDER — VITAL 1.5 CAL PO LIQD
1000.0000 mL | ORAL | Status: DC
  Administered 2019-10-21 – 2019-10-28 (×8): 1000 mL

## 2019-10-21 MED ORDER — PRO-STAT SUGAR FREE PO LIQD
60.0000 mL | Freq: Four times a day (QID) | ORAL | Status: DC
  Administered 2019-10-21 – 2019-10-28 (×30): 60 mL
  Filled 2019-10-21 (×30): qty 60

## 2019-10-21 NOTE — Progress Notes (Signed)
Belongings at patient's bedside include: 1 gold wedding ring 1 black cell phone 1 partial plate [employer] duffle bag with:  glasses, key chain, clothes, toiletry bag including razor, body wash, tooth brush and tooth paste Bag of home medications (x9 bottles) Croc shoes [employer] robe  All inventoried with Richardo Hanks, RN. Will have dayshift RN ask family to come pick up his belongings otherwise they will go to security.

## 2019-10-21 NOTE — Progress Notes (Signed)
Pt's head turned at this time w/o complications.

## 2019-10-21 NOTE — Progress Notes (Signed)
Nutrition Follow-up  DOCUMENTATION CODES:   Severe malnutrition in context of acute illness/injury  INTERVENTION:   Initiate Vital 1.5 @ 40 ml/hr via OG tube 60 ml Prostat QID  Provides: 2240 kcal, 180 grams protein, and 733 ml free water.   Recommend post pyloric cortrak tube placement on Wednesday 12/9   NUTRITION DIAGNOSIS:   Severe Malnutrition related to acute illness(COVID-19) as evidenced by energy intake < or equal to 50% for > or equal to 5 days, percent weight loss(3% weight loss within 1 week).  GOAL:   Patient will meet greater than or equal to 90% of their needs  MONITOR:   PO intake, Supplement acceptance, Labs, Skin  REASON FOR ASSESSMENT:   Consult, Ventilator Enteral/tube feeding initiation and management  ASSESSMENT:   63 yo male admitted with SOB, COVID-19 positive. He was sick 5-6 days before coming to the hospital. PMH includes HTN, DM-2, CAD.   During initial part of hospitalization, patient was eating well, consuming 75-100% of meals. Over the past 5 days, he has been consuming 0-25% of most meals.  Per MD pt admitted 11/16 and was treated for 3 weeks without improvement.  12/3 pt developed epistaxis and worsening hypoxemia and intubated Per notes pt is dyssynchronous on vent Plan to prone pt 12/4 TF protocol started: Vital High Protein @ 40 ml/hr with 30 ml Prostat BID: 1160 kcal and 114 grams protein  Patient is currently intubated on ventilator support MV: 7.6 L/min Temp (24hrs), Avg:99 F (37.2 C), Min:98.2 F (36.8 C), Max:100.4 F (38 C)  Labs reviewed. BUN 80 (H), creatinine 1.53 (H), magnesium 3 (H) PO4: 5 (H) CBG's: [phone]  Medications reviewed and include Novolog SSI every 4 hours, Levemir 20 units once daily, solu-medrol, MVI, vitamin B-12, vitamin C, zinc sulfate. Fentanyl Versed    Diet Order:   Diet Order    None      EDUCATION NEEDS:   Education needs have been addressed  Skin:  Skin Assessment: Skin  Integrity Issues: Skin Integrity Issues:: Stage II Stage II: coccyx  Last BM:  12/3  Height:   Ht Readings from Last 1 Encounters:  09/19/2019 5\' 7"  (1.702 m)    Weight:   Wt Readings from Last 1 Encounters:  10/21/19 88.2 kg    Ideal Body Weight:  67.3 kg  BMI:  Body mass index is 30.45 kg/m.  Estimated Nutritional Needs:   Kcal:  [id number]  Protein:  130-150 gm  Fluid:  >/= 2.2 L   Maylon Peppers RD, LDN, CNSC [phone] Pager [phone] After Hours Pager

## 2019-10-21 NOTE — Progress Notes (Signed)
[place] Progress Note Patient Name: William [name] DOB: 09/21/1956 MRN: [id number]   Date of Service  10/21/2019  HPI/Events of Note  Episode of desaturation. After cuff re-inflated saturation seemed to have improved. Still on FiO2 100% and proned. Sedated on Fentanyl 350 and Versed 6 with occasional double trigger.  eICU Interventions  CXR to confirm proper tube position     Intervention Category Major Interventions: Respiratory failure - evaluation and management  Judd Lien 10/21/2019, 8:35 PM

## 2019-10-21 NOTE — Progress Notes (Signed)
NAME:  William [name], MRN:  [id number], DOB:  1956/05/04, LOS: 9 ADMISSION DATE:  10/14/2019, CONSULTATION DATE:  12/3 REFERRING MD:  Cathlean Sauer, CHIEF COMPLAINT:  Dyspnea, epistaxis   Brief History   63 y/o male admitted on 11/16 with sever acute respiratory failure with hypoxemia due to COVID 19 pneumonia. He was treated for three weeks with standard therapy, but did not have improvement in oxygenation.  On 12/3 he developed epistaxis and worsening hypoxemia and was moved to the ICU.  Past Medical History  Hypertension CAD DM2 NSTEMI  Significant Hospital Events   11/16 admit, treated with heated high flow 11/19 move from PCU 12/3 back to ICU epistaxis  Consults:  PCCM  Procedures:  12/3 ETT >>  Significant Diagnostic Tests:  CTA chest 11/22  >> neg for PE, extensive pneumonia findings BLE Venous Duplex 11/22 >> positive for BLE DVT  Micro Data:  SARS COV 2 11/15 >> positive BCx2 11/16 >> negative  Antimicrobials/COVID Rx  Remdesivir 11/16 > 11/19 Ceftriaxone/azithro x1 11/16 Tocilizumab 11/17  Interim history/subjective:  RT/RN report pt dyssynchronous on vent.  On fentanyl gtt with PRN versed.    Objective   Blood pressure 98/67, pulse 63, temperature 98.8 F (37.1 C), temperature source Axillary, resp. rate 16, height 5\' 7"  (1.702 m), weight 88.2 kg, SpO2 95 %.    Vent Mode: PRVC FiO2 (%):  [90 %-100 %] 100 % Set Rate:  [24 bmp] 24 bmp Vt Set:  [400 mL] 400 mL PEEP:  [10 cmH20-14 cmH20] 14 cmH20 Plateau Pressure:  [31 cmH20] 31 cmH20   Intake/Output Summary (Last 24 hours) at 10/21/2019 0950 Last data filed at 10/21/2019 0800 Gross per 24 hour  Intake 1948.21 ml  Output 2100 ml  Net -151.79 ml   Filed Weights   10/19/19 0600 10/20/19 0427 10/21/19 0500  Weight: 89.1 kg 87.7 kg 88.2 kg    Examination: General: critically ill appearing adult male lying in bed on vent  HEENT: MM pink/moist, ETT, no jvd, pupils 33mm, anicteric, nasal packing in place  Neuro: awakens to voice, nods yes/no to questions CV: s1s2 rrr, tachy, no m/r/g PULM:  Vent dyssynchrony, no accessory muscle use, lungs bilaterally coarse, distant GI: soft, bsx4 active  Extremities: warm/dry, no edema  Skin: no rashes or lesions  Resolved Hospital Problem list     Assessment & Plan:   Epistaxis in setting of prolonged Hypoxemia from COVID-19 PNA Acute Hypoxemic Respiratory Failure  -continue afrin, nasal packing -low Vt ventilation 4-8cc/kg -goal plateau pressure <30, driving pressure [id number] cm H2O -target PaO2 55-65, titrate PEEP/FiO2 per ARDS protocol  -if P/F ratio <150, consider prone therapy for 16 hours per day -goal CVP <4, diuresis as necessary -VAP prevention measures  -follow intermittent CXR, ABG -continue prednisone taper to off    Need for Sedation in setting of Mechanical Ventilation -RASS Goal: -2 -fentanyl + versed gtt's   AKI on CKD III  -Trend BMP / urinary output -Replace electrolytes as indicated -Avoid nephrotoxic agents, ensure adequate renal perfusion  HTN HLD -hold norvasc 12/4 -reduce carvedilol to 3.125 12/4 with soft pressures / likely from sedation for vent synchrony  -continue lipitor  -ICU monitoring   BLE Acute DVT Hx Sickle Cell Trait DVT positive on 11/22 -resume anticoagulation 12/5  DM II  -CBG Q4, moderate SSI -levemir 20 units QD -2 units TF coverage Q4   Severe Protein Calorie Malnutrition  -TF per Nutrition   Best practice:  Diet: TF Pain/Anxiety/Delirium protocol (if indicated):  yes  VAP protocol (if indicated): yes DVT prophylaxis: hold anticoagulants GI prophylaxis: famotidine Glucose control: SSI Mobility: bed rest Code Status: change to full code Family Communication: Wife [name]) updated on plan of care 12/4 Disposition: ICU  Labs   CBC: Recent Labs  Lab 10/15/19 0203 10/16/19 0355 10/17/19 0224 10/18/19 0210 10/19/19 0210 10/20/19 2016 10/21/19 0332  WBC 17.6* 14.5* 13.2* 14.5*  13.1*  --  14.0*  NEUTROABS 16.9* 13.2* 12.2* 13.3* 11.8*  --   --   HGB 13.0 13.0 12.6* 12.6* 12.9* 13.3 11.7*  HCT 41.4 40.9 39.3 39.7 41.0 39.0 36.9*  MCV 79.2* 78.7* 78.0* 78.9* 79.6*  --  79.7*  PLT 470* 451* 426* 411* 395  --  [id number]    Basic Metabolic Panel: Recent Labs  Lab 10/15/19 0203  10/17/19 0224 10/18/19 0210 10/19/19 0210 10/20/19 0455 10/20/19 1900 10/20/19 2016 10/21/19 0332  NA 143   < > 141 140 140 135  --  138 140  K 4.9   < > 4.5 4.3 4.2 4.4  --  4.8 4.5  CL 108   < > 104 102 101 99  --   --  102  CO2 25   < > 26 25 26 25   --   --  27  GLUCOSE 62*   < > 214* 180* 50* 179*  --   --  157*  BUN 38*   < > 68* 80* 73* 70*  --   --  80*  CREATININE 1.31*   < > 1.43* 1.62* 1.52* 1.41*  --   --  1.53*  CALCIUM 8.7*   < > 8.9 9.1 9.2 8.8*  --   --  8.6*  MG 2.8*   < > 2.8* 2.9* 2.7*  --  3.0*  --  3.0*  PHOS 4.4  --   --   --   --   --  5.6*  --  5.0*   < > = values in this interval not displayed.   GFR: Estimated Creatinine Clearance: 53 mL/min (A) (by C-G formula based on SCr of 1.53 mg/dL (H)). Recent Labs  Lab 10/16/19 0355 10/17/19 0224 10/18/19 0210 10/18/19 0730 10/19/19 0210 10/21/19 0332  PROCALCITON 0.14 0.21  --  0.27 0.18  --   WBC 14.5* 13.2* 14.5*  --  13.1* 14.0*    Liver Function Tests: Recent Labs  Lab 10/16/19 0355 10/17/19 0224 10/18/19 0210 10/19/19 0210 10/21/19 0332  AST 39 33 37 36 21  ALT 51* 56* 55* 52* 36  ALKPHOS 127* 127* 129* 130* 133*  BILITOT 0.9 0.6 0.3 0.5 0.6  PROT 6.4* 6.4* 6.6 6.8 6.4*  ALBUMIN 2.7* 2.8* 2.7* 2.7* 2.5*   No results for input(s): LIPASE, AMYLASE in the last 168 hours. No results for input(s): AMMONIA in the last 168 hours.  ABG    Component Value Date/Time   PHART 7.324 (L) 10/20/2019 2016   PCO2ART 54.6 (H) 10/20/2019 2016   PO2ART 45.0 (L) 10/20/2019 2016   HCO3 28.4 (H) 10/20/2019 2016   TCO2 30 10/20/2019 2016   O2SAT 76.0 10/20/2019 2016     Coagulation Profile: No results for  input(s): INR, PROTIME in the last 168 hours.  Cardiac Enzymes: No results for input(s): CKTOTAL, CKMB, CKMBINDEX, TROPONINI in the last 168 hours.  HbA1C: Hgb A1c MFr Bld  Date/Time Value Ref Range Status  10/02/2019 01:40 PM 8.1 (H) 4.8 - 5.6 % Final    Comment:    REPEATED  TO VERIFY (NOTE) Pre diabetes:          5.7%-6.4% Diabetes:              >6.4% Glycemic control for   <7.0% adults with diabetes   01/09/2014 05:10 AM 8.9 (H) <5.7 % Final    Comment:    (NOTE)                                                                       According to the ADA Clinical Practice Recommendations for 2011, when HbA1c is used as a screening test:  >=6.5%   Diagnostic of Diabetes Mellitus           (if abnormal result is confirmed) 5.7-6.4%   Increased risk of developing Diabetes Mellitus References:Diagnosis and Classification of Diabetes Mellitus,Diabetes [id number] 1):S62-S69 and Standards of Medical Care in         Diabetes - 2011,Diabetes [id number] (Suppl 1):S11-S61.    CBG: Recent Labs  Lab 10/20/19 1548 10/20/19 2025 10/20/19 2155 10/21/19 0023 10/21/19 0821  GLUCAP 380* 263* 265* 189* 139*     Critical care time: 30 minutes    Noe Gens, MSN, NP-C [facility] 10/21/2019, 9:50 AM   Please see [email or web address] for pager details.

## 2019-10-21 NOTE — Progress Notes (Signed)
Attending:    Subjective: Severly dyssynchronous Adding  Versed gtt High vent settings No more nose bleeds  Objective: Vitals:   10/21/19 0800 10/21/19 0830 10/21/19 0900 10/21/19 0930  BP: 131/60 118/66 100/61 98/67  Pulse: 65 65 63 63  Resp: 19 18 20 16   Temp: 98.8 F (37.1 C)     TempSrc: Axillary     SpO2: 90% 94% 92% 95%  Weight:      Height:       Vent Mode: PRVC FiO2 (%):  [90 %-100 %] 100 % Set Rate:  [24 bmp] 24 bmp Vt Set:  [400 mL] 400 mL PEEP:  [10 cmH20-14 cmH20] 14 cmH20 Plateau Pressure:  [31 cmH20] 31 cmH20  Intake/Output Summary (Last 24 hours) at 10/21/2019 1042 Last data filed at 10/21/2019 1038 Gross per 24 hour  Intake 1613.73 ml  Output 2100 ml  Net -486.27 ml    General:  In bed on vent HENT: NCAT ETT in place, nasal packing in place PULM: CTA B, vent supported breathing CV: RRR, no mgr GI: BS+, soft, nontender MSK: normal bulk and tone Neuro: sedated on vent  CBC    Component Value Date/Time   WBC 14.0 (H) 10/21/2019 0332   RBC 4.63 10/21/2019 0332   HGB 11.7 (L) 10/21/2019 0332   HCT 36.9 (L) 10/21/2019 0332   PLT 274 10/21/2019 0332   MCV 79.7 (L) 10/21/2019 0332   MCH 25.3 (L) 10/21/2019 0332   MCHC 31.7 10/21/2019 0332   RDW 15.4 10/21/2019 0332   LYMPHSABS 0.5 (L) 10/19/2019 0210   MONOABS 0.3 10/19/2019 0210   EOSABS 0.4 10/19/2019 0210   BASOSABS 0.0 10/19/2019 0210    BMET    Component Value Date/Time   NA 140 10/21/2019 0332   K 4.5 10/21/2019 0332   CL 102 10/21/2019 0332   CO2 27 10/21/2019 0332   GLUCOSE 157 (H) 10/21/2019 0332   BUN 80 (H) 10/21/2019 0332   CREATININE 1.53 (H) 10/21/2019 0332   CALCIUM 8.6 (L) 10/21/2019 0332   GFRNONAA 48 (L) 10/21/2019 0332   GFRAA 56 (L) 10/21/2019 0332    CXR images 12/3 ett in place, bilateral severe airspace diasease  Impression/Plan: ARDS due to COVID 19 pneumonia, complicated by epistaxis and likely acute pulmonary edema: now with severe ventilator  dyssynchrony: add versed, repeat ABG on versed, likely needs prone today Renal function a little worse> Monitor BMET and UOP Replace electrolytes as needed   Rest per NP note  My cc time 31 minutes  [name], MD [place] [facility] Pager: [phone] Cell: [phone] After 3pm or if no response, call [phone]

## 2019-10-21 NOTE — Progress Notes (Signed)
Pt's head turned at this time w/o complication 

## 2019-10-21 NOTE — Plan of Care (Signed)

## 2019-10-22 ENCOUNTER — Inpatient Hospital Stay (HOSPITAL_COMMUNITY): Payer: Commercial Managed Care - PPO

## 2019-10-22 DIAGNOSIS — J9601 Acute respiratory failure with hypoxia: Secondary | ICD-10-CM | POA: Diagnosis not present

## 2019-10-22 LAB — CBC
HCT: 38.2 % — ABNORMAL LOW (ref 39.0–52.0)
Hemoglobin: 11.5 g/dL — ABNORMAL LOW (ref 13.0–17.0)
MCH: 25.2 pg — ABNORMAL LOW (ref 26.0–34.0)
MCHC: 30.1 g/dL (ref 30.0–36.0)
MCV: 83.8 fL (ref 80.0–100.0)
Platelets: 250 10*3/uL (ref 150–400)
RBC: 4.56 MIL/uL (ref 4.22–5.81)
RDW: 15.9 % — ABNORMAL HIGH (ref 11.5–15.5)
WBC: 15.6 10*3/uL — ABNORMAL HIGH (ref 4.0–10.5)
nRBC: 0 % (ref 0.0–0.2)

## 2019-10-22 LAB — HEPARIN LEVEL (UNFRACTIONATED): Heparin Unfractionated: 0.15 IU/mL — ABNORMAL LOW (ref 0.30–0.70)

## 2019-10-22 LAB — GLUCOSE, CAPILLARY
Glucose-Capillary: 105 mg/dL — ABNORMAL HIGH (ref 70–99)
Glucose-Capillary: 120 mg/dL — ABNORMAL HIGH (ref 70–99)
Glucose-Capillary: 125 mg/dL — ABNORMAL HIGH (ref 70–99)
Glucose-Capillary: 140 mg/dL — ABNORMAL HIGH (ref 70–99)
Glucose-Capillary: 168 mg/dL — ABNORMAL HIGH (ref 70–99)
Glucose-Capillary: 208 mg/dL — ABNORMAL HIGH (ref 70–99)

## 2019-10-22 LAB — POCT I-STAT 7, (LYTES, BLD GAS, ICA,H+H)
Acid-Base Excess: 4 mmol/L — ABNORMAL HIGH (ref 0.0–2.0)
Bicarbonate: 34.1 mmol/L — ABNORMAL HIGH (ref 20.0–28.0)
Calcium, Ion: 1.38 mmol/L (ref 1.15–1.40)
HCT: 36 % — ABNORMAL LOW (ref 39.0–52.0)
Hemoglobin: 12.2 g/dL — ABNORMAL LOW (ref 13.0–17.0)
O2 Saturation: 93 %
Patient temperature: 97
Potassium: 5.3 mmol/L — ABNORMAL HIGH (ref 3.5–5.1)
Sodium: 146 mmol/L — ABNORMAL HIGH (ref 135–145)
TCO2: 36 mmol/L — ABNORMAL HIGH (ref 22–32)
pCO2 arterial: 77.4 mmHg (ref 32.0–48.0)
pH, Arterial: 7.247 — ABNORMAL LOW (ref 7.350–7.450)
pO2, Arterial: 79 mmHg — ABNORMAL LOW (ref 83.0–108.0)

## 2019-10-22 LAB — COMPREHENSIVE METABOLIC PANEL
ALT: 31 U/L (ref 0–44)
AST: 18 U/L (ref 15–41)
Albumin: 2.4 g/dL — ABNORMAL LOW (ref 3.5–5.0)
Alkaline Phosphatase: 124 U/L (ref 38–126)
Anion gap: 9 (ref 5–15)
BUN: 99 mg/dL — ABNORMAL HIGH (ref 8–23)
CO2: 33 mmol/L — ABNORMAL HIGH (ref 22–32)
Calcium: 9 mg/dL (ref 8.9–10.3)
Chloride: 102 mmol/L (ref 98–111)
Creatinine, Ser: 1.94 mg/dL — ABNORMAL HIGH (ref 0.61–1.24)
GFR calc Af Amer: 42 mL/min — ABNORMAL LOW (ref >60)
GFR calc non Af Amer: 36 mL/min — ABNORMAL LOW (ref >60)
Glucose, Bld: 219 mg/dL — ABNORMAL HIGH (ref 70–99)
Potassium: 5.7 mmol/L — ABNORMAL HIGH (ref 3.5–5.1)
Sodium: 144 mmol/L (ref 135–145)
Total Bilirubin: 0.3 mg/dL (ref 0.3–1.2)
Total Protein: 6.8 g/dL (ref 6.5–8.1)

## 2019-10-22 LAB — D-DIMER, QUANTITATIVE: D-Dimer, Quant: 15.18 ug/mL-FEU — ABNORMAL HIGH (ref 0.00–0.50)

## 2019-10-22 LAB — FERRITIN: Ferritin: 295 ng/mL (ref 24–336)

## 2019-10-22 LAB — PHOSPHORUS: Phosphorus: 7.6 mg/dL — ABNORMAL HIGH (ref 2.5–4.6)

## 2019-10-22 LAB — MAGNESIUM: Magnesium: 3.2 mg/dL — ABNORMAL HIGH (ref 1.7–2.4)

## 2019-10-22 LAB — C-REACTIVE PROTEIN: CRP: 9.2 mg/dL — ABNORMAL HIGH (ref <1.0)

## 2019-10-22 MED ORDER — ACETAMINOPHEN 325 MG PO TABS
650.0000 mg | ORAL_TABLET | Freq: Four times a day (QID) | ORAL | Status: DC | PRN
  Administered 2019-10-22 – 2019-10-28 (×6): 650 mg
  Filled 2019-10-22 (×8): qty 2

## 2019-10-22 MED ORDER — HEPARIN (PORCINE) 25000 UT/250ML-% IV SOLN
1500.0000 [IU]/h | INTRAVENOUS | Status: DC
  Administered 2019-10-22: 1350 [IU]/h via INTRAVENOUS
  Administered 2019-10-23: 1500 [IU]/h via INTRAVENOUS
  Filled 2019-10-22 (×2): qty 250

## 2019-10-22 MED ORDER — HEPARIN BOLUS VIA INFUSION
2000.0000 [IU] | Freq: Once | INTRAVENOUS | Status: AC
  Administered 2019-10-22: 2000 [IU] via INTRAVENOUS
  Filled 2019-10-22: qty 2000

## 2019-10-22 NOTE — Progress Notes (Signed)
Pt's head turned at this time w/o complication 

## 2019-10-22 NOTE — Progress Notes (Signed)
Foley catheter placed for urinary retention, patient requiring straight cath x3 occurrences. 700cc emptied from bladder on insertion of foley catheter.

## 2019-10-22 NOTE — Progress Notes (Signed)
Called and updated pt's wife re:pt's status/condition. Reviewed COVID Process and how it is affecting  Coagulation in pt's body; use of heparin gtt.Also reviewed pt's course of stay at hospital.  After this review and ability to ask questions, pt's wife, [name], state she felt better.  She still needs to hear from provider re: current xray results and plan of care.  She understands that this will occur sometime tomorrow.  Pt also stated that had found of via Pt's sister, that the pt has Sickle Cell Trait and wanted to validate we were aware of this.  This will updated to the providers and pt' history.

## 2019-10-22 NOTE — Procedures (Signed)
Intubation Procedure Note Brek Reece [id number] 1956/07/02  Procedure: Intubation Indications: Cuff blown on ETT. Needed to replace ETT tube.  Procedure Details Consent: Unable to obtain consent because of emergent medical necessity. Time Out: Verified patient identification, verified procedure, site/side was marked, verified correct patient position, special equipment/implants available, medications/allergies/relevent history reviewed, required imaging and test results available.  Performed  Maximum sterile technique was used including cap, gloves, gown, hand hygiene and mask.  MAC and 4  Placed on 100% FiO2 prior to starting.  Inserted 4 MAC video laryngoscope into oral cavity.  Disconnected ventilator from ETT.  Inserted bouge through ETT and then withdrew #8 ETT with malfunction cuff.  Inserted 7.5 ETT over bouge with direct visualization on video laryngoscope.  Confirmed with CO2 detector and ausculation.  Post procedure CXR showed ETT slightly high at 25 cm at lip.  Advanced to 27 cm at lip.   Evaluation Hemodynamic Status: BP stable throughout; O2 sats: transiently fell during during procedure Patient's Current Condition: stable Complications: No apparent complications Patient did tolerate procedure well. Chest X-ray ordered to verify placement.  CXR: tube position high-repostitioned.   Ashana Tullo 10/22/2019

## 2019-10-22 NOTE — Progress Notes (Signed)
Patient's head turned to the left without complications.  Left arm rotated up and right arm rotated down.

## 2019-10-22 NOTE — Progress Notes (Addendum)
NAME:  William [name], MRN:  [id number], DOB:  [birth year], LOS: 2 ADMISSION DATE:  09/18/2019, CONSULTATION DATE:  12/3 REFERRING MD:  Cathlean Sauer, CHIEF COMPLAINT:  Dyspnea, epistaxis   Brief History   63 y/o male admitted on 11/16 with sever acute respiratory failure with hypoxemia due to COVID 19 pneumonia. He was treated for three weeks with standard therapy, but did not have improvement in oxygenation.  On 12/3 he developed epistaxis and worsening hypoxemia and was moved to the ICU.  Past Medical History  Hypertension CAD DM2 NSTEMI Sickle cell trait  Significant Hospital Events   11/16 admit, treated with heated high flow 11/19 move from PCU 12/03 back to ICU epistaxis 12/05 cuff blown on ETT >> replaced ETT  Consults:    Procedures:  12/3 ETT >>  Significant Diagnostic Tests:  CTA chest 11/22  >> neg for PE, extensive pneumonia findings BLE Venous Duplex 11/22 >> positive for BLE DVT  Micro Data:  SARS COV 2 11/15 >> positive BCx2 11/16 >> negative  Antimicrobials/COVID Rx  Remdesivir 11/16 > 11/19 Ceftriaxone/azithro x1 11/16 Tocilizumab 11/17  Interim history/subjective:  Note to have cuff leak.  Required new ETT.  Objective   Blood pressure (!) 113/55, pulse 70, temperature 98.2 F (36.8 C), temperature source Axillary, resp. rate (!) 32, height 5\' 7"  (1.702 m), weight 88.2 kg, SpO2 97 %.    Vent Mode: PCV FiO2 (%):  [100 %] 100 % Set Rate:  [30 bmp-32 bmp] 32 bmp PEEP:  [14 cmH20] 14 cmH20 Plateau Pressure:  [30 cmH20-37 cmH20] 37 cmH20   Intake/Output Summary (Last 24 hours) at 10/22/2019 1420 Last data filed at 10/22/2019 1400 Gross per 24 hour  Intake 2001.56 ml  Output 2050 ml  Net -48.44 ml   Filed Weights   10/19/19 0600 10/20/19 0427 10/21/19 0500  Weight: 89.1 kg 87.7 kg 88.2 kg    Examination:  General - sedated Eyes - pupils reactive ENT - ETT in place, nasal packing in place Cardiac - regular rate/rhythm, no murmur Chest - b/l  rhonchi Abdomen - soft, non tender, + bowel sounds Extremities - 1+ edema Skin - no rashes Neuro - RASS -4  CXR (reviewed by me) - b/l ASD, ?pneumomediastinum with line around cardiac silhouette   Resolved Hospital Problem list     Assessment & Plan:   Acute hypoxic, hypercapnic respiratory failure from COVID 19 pneumonia complicated by epistaxis and concern for aspiration. - continue afrin, flonase, nasal packing for now - full vent support - f/u CXR, ABG - prone position if PaO2:FiO2 < 150 - wean off prednisone  Acute metabolic encephalopathy. - RASS goal -2 to -3  CKD 3. Hyperkalemia. - f/u BMET  Hx of CAD s/p stent 2015, HTN, HLD. - continue coreg, lipitor - hold outpt norvasc, valsartan, HCTZ  Acute B/L lower leg DVT from 10/09/19. - anticoagulation held in setting of epistaxis >> resume 12/05; will use heparin instead of lovenox in setting of renal issues  DM II.  - SSI with levemir  Severe Protein Calorie Malnutrition.  - TF per Nutrition   Best practice:  Diet: TF DVT prophylaxis: heparin gtt GI prophylaxis: famotidine Mobility: bed rest Code Status: full code Disposition: ICU  Labs    CMP Latest Ref Rng & Units 10/22/2019 10/22/2019 10/21/2019  Glucose 70 - 99 mg/dL - 219(H) -  BUN 8 - 23 mg/dL - 99(H) -  Creatinine 0.61 - 1.24 mg/dL - 1.94(H) -  Sodium 135 - 145 mmol/L 146(H)  144 141  Potassium 3.5 - 5.1 mmol/L 5.3(H) 5.7(H) 5.3(H)  Chloride 98 - 111 mmol/L - 102 -  CO2 22 - 32 mmol/L - 33(H) -  Calcium 8.9 - 10.3 mg/dL - 9.0 -  Total Protein 6.5 - 8.1 g/dL - 6.8 -  Total Bilirubin 0.3 - 1.2 mg/dL - 0.3 -  Alkaline Phos 38 - 126 U/L - 124 -  AST 15 - 41 U/L - 18 -  ALT 0 - 44 U/L - 31 -    CBC Latest Ref Rng & Units 10/22/2019 10/22/2019 10/21/2019  WBC 4.0 - 10.5 K/uL - 15.6(H) -  Hemoglobin 13.0 - 17.0 g/dL 12.2(L) 11.5(L) 11.6(L)  Hematocrit 39.0 - 52.0 % 36.0(L) 38.2(L) 34.0(L)  Platelets 150 - 400 K/uL - 250 -    ABG    Component  Value Date/Time   PHART 7.247 (L) 10/22/2019 1135   PCO2ART 77.4 (HH) 10/22/2019 1135   PO2ART 79.0 (L) 10/22/2019 1135   HCO3 34.1 (H) 10/22/2019 1135   TCO2 36 (H) 10/22/2019 1135   O2SAT 93.0 10/22/2019 1135    CBG (last 3)  Recent Labs    10/22/19 0447 10/22/19 0800 10/22/19 1133  GLUCAP 208* 168* 120*    CC time independent of procedure time 36 minutes  Chesley Mires, MD [facility] 10/22/2019, 2:30 PM

## 2019-10-22 NOTE — Progress Notes (Signed)
Patient's cheeks and chin padded.  Patient's ETT re-secured by cloth tape at 27 on the left.    Patient placed in prone position by RT x 2 and RN x 5 without complications.

## 2019-10-22 NOTE — Progress Notes (Signed)
ANTICOAGULATION CONSULT NOTE - Follow Up Consult  Pharmacy Consult for Heparin IV Indication: VTE treatment  Allergies  Allergen Reactions  . Shellfish Allergy Hives    Patient Measurements: Height: _0  (170.2 cm) Weight: 194 lb 7.1 oz (88.2 kg) IBW/kg (Calculated) : 66.1  Heparin dosing weight: 84 kg  Vital Signs: Temp: 98.2 F (36.8 C) (12/05 1200) Temp Source: Axillary (12/05 1200) BP: 113/55 (12/05 1400) Pulse Rate: 70 (12/05 1400)  Labs: Recent Labs    10/20/19 0455  10/21/19 0332 10/21/19 1443 10/22/19 0315 10/22/19 1135  HGB  --    < > 11.7* 11.6* 11.5* 12.2*  HCT  --    < > 36.9* 34.0* 38.2* 36.0*  PLT  --   --  274  --  250  --   HEPRLOWMOCWT 1.22  --   --   --   --   --   CREATININE 1.41*  --  1.53*  --  1.94*  --    < > = values in this interval not displayed.    Estimated Creatinine Clearance: 41.8 mL/min (A) (by C-G formula based on SCr of 1.94 mg/dL (H)).   Medications:  Scheduled:  . atorvastatin  40 mg Per Tube q1800  . carvedilol  3.125 mg Per Tube BID WC  . chlorhexidine gluconate (MEDLINE KIT)  15 mL Mouth Rinse BID  . Chlorhexidine Gluconate Cloth  6 each Topical Daily  . clonazePAM  1 mg Per Tube BID  . famotidine  20 mg Per Tube QHS  . feeding supplement (PRO-STAT SUGAR FREE 64)  60 mL Per Tube QID  . feeding supplement (VITAL 1.5 CAL)  1,000 mL Per Tube Q24H  . fluticasone  2 spray Each Nare BID  . insulin aspart  0-15 Units Subcutaneous Q4H  . insulin aspart  2 Units Subcutaneous Q4H  . insulin detemir  20 Units Subcutaneous Daily  . mouth rinse  15 mL Mouth Rinse 10 times per day  . multivitamin  15 mL Per Tube Daily  . oxyCODONE  5 mg Per Tube Q6H  . oxymetazoline  1 spray Each Nare BID  . [START ON 10/23/2019] predniSONE  10 mg Oral Q breakfast   Followed by  . [START ON 10/24/2019] predniSONE  5 mg Oral Q breakfast   Followed by  . [START ON 10/25/2019] predniSONE  2.5 mg Oral Q breakfast  . vitamin B-12  100 mcg Oral Daily   . vitamin C  500 mg Oral Daily  . zinc sulfate  220 mg Oral Daily   Infusions:  . fentaNYL infusion INTRAVENOUS 300 mcg/hr (10/22/19 1346)  . midazolam 4 mg/hr (10/22/19 1344)    Assessment: 31 yoM admitted on 11/16 with COVID-19 pneumonia.  He has been on Lovenox for VTE prophylaxis (BID dosing while in ICU from 11/17-11/21).   11/17 Dopplers negative for DVT 11/22 Dopoplers positive for DVT 11/22 CTa negative for PE. Pharmacy was initially consulted to dose Lovenox for VTE, Lovenox was held on 12/3 for large epistaxis.  Bleeding now resolved after rhinorocket placement on 12/3.  - Scr increased to 1.94 with CrCl ~41 ml/min. - CBC:  Hgb remains stable at 11.5, Plt 250k.   - No further bleeding or epistaxis reported - Most recent Anti-Xa level 1.2 (12/3) on reduced dose Lovenox 60 mg BID was slightly supra-therapeutic but decreasing, with SCr was 1.4 at that time. - D-dimer increased 2.4 > 15.18  Goal of Therapy:  Anti-Xa level 0.6-1 units/ml 4hrs after  LMWH dose given Monitor platelets by anticoagulation protocol: Yes   Plan:  Give heparin 2000 units bolus IV x 1 (lower dose d/t recent bleeding) Start heparin IV infusion at 1350 units/hr Heparin level 6 hours after starting Daily heparin level and CBC Continue to monitor for s/s bleeding, epistaxis.   Gretta Arab PharmD, BCPS Clinical pharmacist phone 7am- 5pm: [phone] 10/22/2019 2:38 PM

## 2019-10-22 NOTE — Progress Notes (Signed)
Pt unproned at this time and cloth tape replaced with commercial tube holder.

## 2019-10-23 ENCOUNTER — Inpatient Hospital Stay (HOSPITAL_COMMUNITY): Payer: Commercial Managed Care - PPO

## 2019-10-23 DIAGNOSIS — J9601 Acute respiratory failure with hypoxia: Secondary | ICD-10-CM | POA: Diagnosis not present

## 2019-10-23 LAB — CBC
HCT: 39 % (ref 39.0–52.0)
Hemoglobin: 11.4 g/dL — ABNORMAL LOW (ref 13.0–17.0)
MCH: 25.2 pg — ABNORMAL LOW (ref 26.0–34.0)
MCHC: 29.2 g/dL — ABNORMAL LOW (ref 30.0–36.0)
MCV: 86.3 fL (ref 80.0–100.0)
Platelets: 197 10*3/uL (ref 150–400)
RBC: 4.52 MIL/uL (ref 4.22–5.81)
RDW: 16.3 % — ABNORMAL HIGH (ref 11.5–15.5)
WBC: 12.8 10*3/uL — ABNORMAL HIGH (ref 4.0–10.5)
nRBC: 0.2 % (ref 0.0–0.2)

## 2019-10-23 LAB — GLUCOSE, CAPILLARY
Glucose-Capillary: 156 mg/dL — ABNORMAL HIGH (ref 70–99)
Glucose-Capillary: 161 mg/dL — ABNORMAL HIGH (ref 70–99)
Glucose-Capillary: 247 mg/dL — ABNORMAL HIGH (ref 70–99)
Glucose-Capillary: 255 mg/dL — ABNORMAL HIGH (ref 70–99)
Glucose-Capillary: 307 mg/dL — ABNORMAL HIGH (ref 70–99)
Glucose-Capillary: 308 mg/dL — ABNORMAL HIGH (ref 70–99)

## 2019-10-23 LAB — COMPREHENSIVE METABOLIC PANEL
ALT: 28 U/L (ref 0–44)
AST: 26 U/L (ref 15–41)
Albumin: 1.9 g/dL — ABNORMAL LOW (ref 3.5–5.0)
Alkaline Phosphatase: 102 U/L (ref 38–126)
Anion gap: 9 (ref 5–15)
BUN: 114 mg/dL — ABNORMAL HIGH (ref 8–23)
CO2: 31 mmol/L (ref 22–32)
Calcium: 9.5 mg/dL (ref 8.9–10.3)
Chloride: 109 mmol/L (ref 98–111)
Creatinine, Ser: 2.32 mg/dL — ABNORMAL HIGH (ref 0.61–1.24)
GFR calc Af Amer: 34 mL/min — ABNORMAL LOW (ref >60)
GFR calc non Af Amer: 29 mL/min — ABNORMAL LOW (ref >60)
Glucose, Bld: 182 mg/dL — ABNORMAL HIGH (ref 70–99)
Potassium: 6.1 mmol/L — ABNORMAL HIGH (ref 3.5–5.1)
Sodium: 149 mmol/L — ABNORMAL HIGH (ref 135–145)
Total Bilirubin: 0.3 mg/dL (ref 0.3–1.2)
Total Protein: 6.4 g/dL — ABNORMAL LOW (ref 6.5–8.1)

## 2019-10-23 LAB — POCT I-STAT 7, (LYTES, BLD GAS, ICA,H+H)
Acid-Base Excess: 5 mmol/L — ABNORMAL HIGH (ref 0.0–2.0)
Bicarbonate: 32.8 mmol/L — ABNORMAL HIGH (ref 20.0–28.0)
Calcium, Ion: 1.33 mmol/L (ref 1.15–1.40)
HCT: 33 % — ABNORMAL LOW (ref 39.0–52.0)
Hemoglobin: 11.2 g/dL — ABNORMAL LOW (ref 13.0–17.0)
O2 Saturation: 95 %
Patient temperature: 37
Potassium: 5.6 mmol/L — ABNORMAL HIGH (ref 3.5–5.1)
Sodium: 147 mmol/L — ABNORMAL HIGH (ref 135–145)
TCO2: 35 mmol/L — ABNORMAL HIGH (ref 22–32)
pCO2 arterial: 66.9 mmHg (ref 32.0–48.0)
pH, Arterial: 7.299 — ABNORMAL LOW (ref 7.350–7.450)
pO2, Arterial: 89 mmHg (ref 83.0–108.0)

## 2019-10-23 LAB — HEPARIN LEVEL (UNFRACTIONATED)
Heparin Unfractionated: <0.1 IU/mL — ABNORMAL LOW (ref 0.30–0.70)
Heparin Unfractionated: <0.1 IU/mL — ABNORMAL LOW (ref 0.30–0.70)
Heparin Unfractionated: <0.1 IU/mL — ABNORMAL LOW (ref 0.30–0.70)

## 2019-10-23 LAB — FERRITIN: Ferritin: 302 ng/mL (ref 24–336)

## 2019-10-23 LAB — D-DIMER, QUANTITATIVE: D-Dimer, Quant: 14.09 ug/mL-FEU — ABNORMAL HIGH (ref 0.00–0.50)

## 2019-10-23 LAB — C-REACTIVE PROTEIN: CRP: 22.1 mg/dL — ABNORMAL HIGH (ref <1.0)

## 2019-10-23 MED ORDER — HEPARIN (PORCINE) 25000 UT/250ML-% IV SOLN
2200.0000 [IU]/h | INTRAVENOUS | Status: DC
  Administered 2019-10-23 – 2019-10-24 (×2): 2000 [IU]/h via INTRAVENOUS
  Administered 2019-10-24: 2200 [IU]/h via INTRAVENOUS
  Filled 2019-10-23 (×3): qty 250

## 2019-10-23 MED ORDER — HEPARIN BOLUS VIA INFUSION
2000.0000 [IU] | Freq: Once | INTRAVENOUS | Status: AC
  Administered 2019-10-23: 2000 [IU] via INTRAVENOUS
  Filled 2019-10-23: qty 2000

## 2019-10-23 MED ORDER — FLUTICASONE PROPIONATE 50 MCG/ACT NA SUSP
1.0000 | Freq: Every day | NASAL | Status: DC
  Administered 2019-10-24 – 2019-10-28 (×4): 1 via NASAL
  Filled 2019-10-23: qty 16

## 2019-10-23 MED ORDER — HEPARIN (PORCINE) 25000 UT/250ML-% IV SOLN: 1800.0000 [IU]/h | INTRAVENOUS | Status: DC

## 2019-10-23 MED ORDER — SALINE SPRAY 0.65 % NA SOLN
1.0000 | Freq: Two times a day (BID) | NASAL | Status: DC
  Administered 2019-10-23 – 2019-10-28 (×10): 1 via NASAL
  Filled 2019-10-23: qty 44

## 2019-10-23 MED ORDER — SODIUM ZIRCONIUM CYCLOSILICATE 10 G PO PACK
10.0000 g | PACK | Freq: Three times a day (TID) | ORAL | Status: AC
  Administered 2019-10-23 (×2): 10 g via ORAL
  Filled 2019-10-23 (×2): qty 1

## 2019-10-23 MED ORDER — HEPARIN BOLUS VIA INFUSION
1300.0000 [IU] | Freq: Once | INTRAVENOUS | Status: AC
  Administered 2019-10-23: 1300 [IU] via INTRAVENOUS
  Filled 2019-10-23: qty 1300

## 2019-10-23 MED ORDER — HEPARIN BOLUS VIA INFUSION
3000.0000 [IU] | Freq: Once | INTRAVENOUS | Status: AC
  Administered 2019-10-23: 3000 [IU] via INTRAVENOUS
  Filled 2019-10-23: qty 3000

## 2019-10-23 NOTE — Progress Notes (Addendum)
ANTICOAGULATION CONSULT NOTE - Follow Up Consult  Pharmacy Consult for Heparin IV Indication: VTE treatment  Allergies  Allergen Reactions  . Shellfish Allergy Hives    Patient Measurements: Height: 5\' 7"  (170.2 cm) Weight: 196 lb 10.4 oz (89.2 kg) IBW/kg (Calculated) : 66.1  Heparin dosing weight: 84 kg  Vital Signs: Temp: 98.8 F (37.1 C) (12/06 0304) Temp Source: Axillary (12/06 0304) BP: 98/56 (12/06 0800) Pulse Rate: 63 (12/06 0800)  Labs: Recent Labs    10/21/19 0332  10/22/19 0315 10/22/19 1135 10/22/19 2150 10/23/19 0657  HGB 11.7*   < > 11.5* 12.2*  --  11.2*  HCT 36.9*   < > 38.2* 36.0*  --  33.0*  PLT 274  --  250  --   --   --   HEPARINUNFRC  --   --   --   --  0.15*  --   CREATININE 1.53*  --  1.94*  --   --   --    < > = values in this interval not displayed.    Estimated Creatinine Clearance: 42 mL/min (A) (by C-G formula based on SCr of 1.94 mg/dL (H)).   Medications: Infusions:  . fentaNYL infusion INTRAVENOUS 250 mcg/hr (10/23/19 0700)  . heparin 1,500 Units/hr (10/23/19 0700)  . midazolam 3 mg/hr (10/23/19 0700)    Assessment: 58 yoM admitted on 11/16 with COVID-19 pneumonia.  He has been on Lovenox for VTE prophylaxis (BID dosing while in ICU from 11/17-11/21).   11/17 Dopplers negative for DVT; 11/22 Dopoplers positive for DVT 11/22 CTa negative for PE. Pharmacy was initially consulted to dose Lovenox for VTE, Lovenox was held on 12/3 for large epistaxis.  Bleeding now resolved after rhinorocket placement on 12/3.  Today, 10/23/2019: - CBC:  Hgb remains stable at 11.4, Plt 197k.   - No further bleeding or epistaxis reported.  Rhinorocket removed on [id number] without complications. - Most recent Anti-Xa level 1.2 (12/3) on reduced dose Lovenox 60 mg BID was slightly supra-therapeutic but decreasing, with SCr 1.4 at that time. - D-dimer:  2.4 > 15.18>14  HL result < 0.1, unexpectedly low despite previous bolus and rate increased.  Lab was  drawn at 06:36, resulted at 09:10.  Accuracy of results > 2 hours from draw time are unreliable per [facility] lab.  Repeat STAT heparin level.  Goal of Therapy:  Anti-Xa level 0.6-1 units/ml 4hrs after LMWH dose given Monitor platelets by anticoagulation protocol: Yes   Plan:  Heparin IV infusion at 1500 units/hr Heparin level in 6 hours Daily heparin level and CBC Continue to monitor for s/s bleeding, epistaxis.  Gretta Arab PharmD, BCPS Clinical pharmacist phone 7am- 5pm: [phone] 10/23/2019 8:22 AM   Addendum: Repeat HL remains < 0.1 Bolus heparin 2000 units. Increase to Heparin IV at 1800 units/hr Heparin level in 6 hours  Gretta Arab PharmD, BCPS Clinical pharmacist phone 7am- 5pm: [phone] 10/23/2019 11:23 AM

## 2019-10-23 NOTE — Plan of Care (Signed)
Pt proned for shift.  Pt remained on Versed/Fentanyl gtts for vent management.  SBP 80-100s throughout shift, MAP 63 to 72 mmHg.  UO remain low but starting to pick up, noted less to no sediment in last 3 hrs.  Monitor showed SR with rare PVC.    Problem: Respiratory: Goal: Complications related to the disease process, condition or treatment will be avoided or minimized Outcome: Not Progressing   Problem: Activity: Goal: Risk for activity intolerance will decrease Outcome: Not Progressing

## 2019-10-23 NOTE — Progress Notes (Signed)
Patient's ETT secured with cloth tape at 25cm in the center after applying foam padding to the patient's cheeks.  Patient placed in prone position by RT x 2 and RN x 4 without complications. Head turned to the right.

## 2019-10-23 NOTE — Progress Notes (Signed)
Pt's head turned at this time w/o complication.  Pt biting tube and RT having difficulty passing catheter.

## 2019-10-23 NOTE — Progress Notes (Signed)
Pt's head turned at this time w/o complication 

## 2019-10-23 NOTE — Progress Notes (Signed)
Called [facility]Link re: low UO for past 2hrs - 20-61ml/hr.  Noted sediment with some small clots in catheter at 2230 but not at [place]. Pt remains proned.  SBP upper 80s to low 90s during past hour which titrated Fentanyl gtt down to 251mcg.  Pt remains proned. Discussed with Kathlee Nations, RN, at [facility].  Currently will continue to [facility] UO/BP.  Will F/U if continue to remain low/poor.  Pt to be unproned at 0630/0700 today.

## 2019-10-23 NOTE — Progress Notes (Signed)
Patient's head turned to the left by RT x 2 and RN x 1 without complications.

## 2019-10-23 NOTE — Progress Notes (Signed)
Spoke with pt's wife over the phone.  Explained that he is still on full support from ventilator and not at a point to consider vent weaning.  Explained that his blood counts have been stable, and no further episode of epistaxis after resuming heparin.  She asked about tracheostomy - discussed the indications for this and pros/cons.  Explained he isn't at a point we need to consider tracheostomy, but this might be more of a discussion point if he doesn't show more rapid improvements.  Chesley Mires, MD [facility] Pulmonary/Critical Care 10/23/2019, 2:19 PM

## 2019-10-23 NOTE — Progress Notes (Signed)
NAME:  William [name], MRN:  [id number], DOB:  1956-04-12, LOS: 71 ADMISSION DATE:  09/22/2019, CONSULTATION DATE:  12/3 REFERRING MD:  Cathlean Sauer, CHIEF COMPLAINT:  Dyspnea, epistaxis   Brief History   63 y/o male admitted on 11/16 with sever acute respiratory failure with hypoxemia due to COVID 19 pneumonia. He was treated for three weeks with standard therapy, but did not have improvement in oxygenation.  On 12/3 he developed epistaxis and worsening hypoxemia and was moved to the ICU.  Past Medical History  Hypertension CAD DM2 NSTEMI Sickle cell trait  Significant Hospital Events   11/16 admit, treated with heated high flow 11/19 move from PCU 12/03 back to ICU epistaxis 12/05 cuff blown on ETT >> replaced ETT; resumed anticoagulation 12/06 removed nasal packing  Consults:    Procedures:  12/3 ETT >>  Significant Diagnostic Tests:  CTA chest 11/22  >> neg for PE, extensive pneumonia findings BLE Venous Duplex 11/22 >> positive for BLE DVT  Micro Data:  SARS COV 2 11/15 >> positive BCx2 11/16 >> negative  Antimicrobials/COVID Rx  Remdesivir 11/16 > 11/19 Ceftriaxone/azithro x1 11/16 Tocilizumab 11/17  Interim history/subjective:  Remains on increased PEEP/FiO2.  PaO2:FiO2 80.  FiO2 100%, PEEP 14.  No further epistaxis.  Objective   Blood pressure (!) 103/58, pulse 66, temperature 98.8 F (37.1 C), temperature source Axillary, resp. rate (!) 2, height 5\' 7"  (1.702 m), weight 89.2 kg, SpO2 96 %.    Vent Mode: PCV FiO2 (%):  [100 %] 100 % Set Rate:  [30 bmp-32 bmp] 32 bmp PEEP:  [14 cmH20] 14 cmH20 Plateau Pressure:  [34 cmH20-40 cmH20] 37 cmH20   Intake/Output Summary (Last 24 hours) at 10/23/2019 0936 Last data filed at 10/23/2019 0800 Gross per 24 hour  Intake 1811.69 ml  Output 3142 ml  Net -1330.31 ml   Filed Weights   10/20/19 0427 10/21/19 0500 10/23/19 0600  Weight: 87.7 kg 88.2 kg 89.2 kg    Examination:  General - sedated Eyes - pupils  reactive ENT - ETT in place Cardiac - regular rate/rhythm, no murmur Chest - b/l crackles Abdomen - soft, non tender, + bowel sounds Extremities - 1+ edema Skin - no rashes Neuro - RASS -3  CXR (reviewed by me) - b/l ASD  Resolved Hospital Problem list     Assessment & Plan:   Acute hypoxic, hypercapnic respiratory failure from COVID 19 pneumonia complicated by epistaxis and concern for aspiration. - nasal packing d/c'ed 12/06 - d/c afrin - continue flonase, saline nasal spray - full vent support - f/u CXR - prone position if PaO2:FiO2 < 150 - wean off prednisone  Acute metabolic encephalopathy. - RASS goal -3 - continue klonopin, oxycodone  CKD 3. Hyperkalemia. - f/u BMET - lokelma  Hx of CAD s/p stent 2015, HTN, HLD. - continue coreg, lipitor - hold outpt norvasc, valsartan, HCTZ  Acute B/L lower leg DVT from 10/09/19. - continue heparin gtt - if no further episodes of bleeding, then consider transitioning to enteral anticoagulation in next few days  DM II.  - SSI with levemir  Severe Protein Calorie Malnutrition.  - TF per Nutrition   Best practice:  Diet: TF DVT prophylaxis: heparin gtt GI prophylaxis: famotidine Mobility: bed rest Code Status: full code Disposition: ICU  Labs    CMP Latest Ref Rng & Units 10/23/2019 10/23/2019 10/22/2019  Glucose 70 - 99 mg/dL - 182(H) -  BUN 8 - 23 mg/dL - PENDING -  Creatinine 0.61 - 1.24  mg/dL - 2.32(H) -  Sodium 135 - 145 mmol/L 147(H) 149(H) 146(H)  Potassium 3.5 - 5.1 mmol/L 5.6(H) 6.1(H) 5.3(H)  Chloride 98 - 111 mmol/L - 109 -  CO2 22 - 32 mmol/L - 31 -  Calcium 8.9 - 10.3 mg/dL - 9.5 -  Total Protein 6.5 - 8.1 g/dL - 6.4(L) -  Total Bilirubin 0.3 - 1.2 mg/dL - 0.3 -  Alkaline Phos 38 - 126 U/L - 102 -  AST 15 - 41 U/L - 26 -  ALT 0 - 44 U/L - 28 -    CBC Latest Ref Rng & Units 10/23/2019 10/23/2019 10/22/2019  WBC 4.0 - 10.5 K/uL - 12.8(H) -  Hemoglobin 13.0 - 17.0 g/dL 11.2(L) 11.4(L) 12.2(L)   Hematocrit 39.0 - 52.0 % 33.0(L) 39.0 36.0(L)  Platelets 150 - 400 K/uL - 197 -    ABG    Component Value Date/Time   PHART 7.299 (L) 10/23/2019 0657   PCO2ART 66.9 (HH) 10/23/2019 0657   PO2ART 89.0 10/23/2019 0657   HCO3 32.8 (H) 10/23/2019 0657   TCO2 35 (H) 10/23/2019 0657   O2SAT 95.0 10/23/2019 0657    CBG (last 3)  Recent Labs    10/22/19 2348 10/23/19 0439 10/23/19 0816  GLUCAP 140* 156* 161*    CC time 32 minutes  Chesley Mires, MD [place] 10/23/2019, 9:36 AM

## 2019-10-23 NOTE — Progress Notes (Signed)
ANTICOAGULATION CONSULT NOTE - Follow Up Consult  Pharmacy Consult for Heparin IV Indication: VTE treatment  Allergies  Allergen Reactions  . Shellfish Allergy Hives    Patient Measurements: Height: 5\' 7"  (170.2 cm) Weight: 194 lb 7.1 oz (88.2 kg) IBW/kg (Calculated) : 66.1  Heparin dosing weight: 84 kg  Vital Signs: Temp: 100 F (37.8 C) (12/05 2330) Temp Source: Axillary (12/05 2330) BP: 94/53 (12/05 2330) Pulse Rate: 78 (12/05 2330)  Labs: Recent Labs    10/20/19 0455  10/21/19 0332 10/21/19 1443 10/22/19 0315 10/22/19 1135 10/22/19 2150  HGB  --    < > 11.7* 11.6* 11.5* 12.2*  --   HCT  --    < > 36.9* 34.0* 38.2* 36.0*  --   PLT  --   --  274  --  250  --   --   HEPARINUNFRC  --   --   --   --   --   --  0.15*  HEPRLOWMOCWT 1.22  --   --   --   --   --   --   CREATININE 1.41*  --  1.53*  --  1.94*  --   --    < > = values in this interval not displayed.    Estimated Creatinine Clearance: 41.8 mL/min (A) (by C-G formula based on SCr of 1.94 mg/dL (H)).   Assessment: 36 yoM admitted on 11/16 with COVID-19 pneumonia.  He has been on Lovenox for VTE prophylaxis (BID dosing while in ICU from 11/17-11/21).   11/17 Dopplers negative for DVT 11/22 Dopoplers positive for DVT 11/22 CTa negative for PE. Pharmacy was initially consulted to dose Lovenox for VTE, Lovenox was held on 12/3 for large epistaxis.  Bleeding now resolved after rhinorocket placement on 12/3.  - Scr increased to 1.94 with CrCl ~41 ml/min. - CBC:  Hgb remains stable at 11.5, Plt 250k.   - No further bleeding or epistaxis reported - Most recent Anti-Xa level 1.2 (12/3) on reduced dose Lovenox 60 mg BID was slightly supra-therapeutic but decreasing, with SCr was 1.4 at that time. - D-dimer increased 2.4 > 15.18  10/23/19 0100 UPDATE: Heparin level: 0.15 IU/mL at 2150 on 12/5--->below therapeutic goal range RN reports no blood in oral or nasal secretions and no issues with infusion   Goal of  Therapy:  Anti-Xa level 0.6-1 units/ml 4hrs after LMWH dose given Monitor platelets by anticoagulation protocol: Yes   Plan:  Give heparin bolus of 1300 units  x 1 (lower dose d/t recent bleeding) Increase heparin  Infusion  rate to  1500 units/hr  Re-check heparin level ~6 hours after rate change Daily heparin level (when appropriate) and CBC Continue to monitor for s/s bleeding, epistaxis.   Despina Pole, Pharm. D. Clinical Pharmacist 10/23/2019 12:59 AM

## 2019-10-23 NOTE — Progress Notes (Signed)
Pt was unproned at this time w/o event.  Pt's cloth tape replaced with commercial tube holder.  RT will continue to monitor.

## 2019-10-23 NOTE — Progress Notes (Addendum)
ANTICOAGULATION CONSULT NOTE - Follow Up Consult  Pharmacy Consult for Heparin IV Indication: VTE treatment  Allergies  Allergen Reactions  . Shellfish Allergy Hives    Patient Measurements: Height: 5\' 7"  (170.2 cm) Weight: 196 lb 10.4 oz (89.2 kg) IBW/kg (Calculated) : 66.1  Heparin dosing weight: 84 kg  Vital Signs: Temp: 99.9 F (37.7 C) (12/06 1600) Temp Source: Axillary (12/06 1600) BP: 118/48 (12/06 1800) Pulse Rate: 74 (12/06 1800)  Labs: Recent Labs    10/21/19 0332  10/22/19 0315 10/22/19 1135  10/23/19 0636 10/23/19 0657 10/23/19 0950 10/23/19 1734  HGB 11.7*   < > 11.5* 12.2*  --  11.4* 11.2*  --   --   HCT 36.9*   < > 38.2* 36.0*  --  39.0 33.0*  --   --   PLT 274  --  250  --   --  197  --   --   --   HEPARINUNFRC  --   --   --   --    < > <0.10*  --  <0.10* <0.10*  CREATININE 1.53*  --  1.94*  --   --  2.32*  --   --   --    < > = values in this interval not displayed.    Estimated Creatinine Clearance: 35.2 mL/min (A) (by C-G formula based on SCr of 2.32 mg/dL (H)).   Medications: Infusions:  . fentaNYL infusion INTRAVENOUS 175 mcg/hr (10/23/19 1302)  . heparin 1,800 Units/hr (10/23/19 1142)  . midazolam 3 mg/hr (10/23/19 0700)    Assessment: 83 yoM admitted on 11/16 with COVID-19 pneumonia.  He has been on Lovenox for VTE prophylaxis (BID dosing while in ICU from 11/17-11/21).   11/17 Dopplers negative for DVT; 11/22 Dopoplers positive for DVT 11/22 CTa negative for PE. Pharmacy was initially consulted to dose Lovenox for VTE, Lovenox was held on 12/3 for large epistaxis.  Bleeding now resolved after rhinorocket placement on 12/3.  Today, 10/23/2019: - CBC:  Hgb remains stable at 11.4, Plt 197k.   - No further bleeding or epistaxis reported.  Rhinorocket removed on [id number] without complications. - Most recent Anti-Xa level 1.2 (12/3) on reduced dose Lovenox 60 mg BID was slightly supra-therapeutic but decreasing, with SCr 1.4 at that time. -  D-dimer:  2.4 > 15.18>14  PM: HL result remains < 0.1, despite 2 boluses and rate increases today.  No bleeding or complications documented.  Goal of Therapy:  Anti-Xa levels 0.3-0.7 Monitor platelets by anticoagulation protocol: Yes   Plan:  Heparin 3000 units IV bolus Increase Heparin IV infusion at 2000 units/hr Heparin level in 8 hours Daily heparin level and CBC Continue to monitor for s/s bleeding, epistaxis.  Peggyann Juba, PharmD, BCPS Pharmacy: [phone] 10/23/2019 7:13 PM

## 2019-10-24 ENCOUNTER — Inpatient Hospital Stay (HOSPITAL_COMMUNITY): Payer: Commercial Managed Care - PPO

## 2019-10-24 DIAGNOSIS — J9601 Acute respiratory failure with hypoxia: Secondary | ICD-10-CM | POA: Diagnosis not present

## 2019-10-24 DIAGNOSIS — N179 Acute kidney failure, unspecified: Secondary | ICD-10-CM | POA: Diagnosis not present

## 2019-10-24 DIAGNOSIS — U071 COVID-19: Secondary | ICD-10-CM | POA: Diagnosis not present

## 2019-10-24 DIAGNOSIS — I5032 Chronic diastolic (congestive) heart failure: Secondary | ICD-10-CM | POA: Diagnosis not present

## 2019-10-24 LAB — POCT I-STAT 7, (LYTES, BLD GAS, ICA,H+H)
Acid-Base Excess: 6 mmol/L — ABNORMAL HIGH (ref 0.0–2.0)
Bicarbonate: 33 mmol/L — ABNORMAL HIGH (ref 20.0–28.0)
Calcium, Ion: 1.41 mmol/L — ABNORMAL HIGH (ref 1.15–1.40)
HCT: 29 % — ABNORMAL LOW (ref 39.0–52.0)
Hemoglobin: 9.9 g/dL — ABNORMAL LOW (ref 13.0–17.0)
O2 Saturation: 92 %
Patient temperature: 37
Potassium: 4.6 mmol/L (ref 3.5–5.1)
Sodium: 151 mmol/L — ABNORMAL HIGH (ref 135–145)
TCO2: 35 mmol/L — ABNORMAL HIGH (ref 22–32)
pCO2 arterial: 59.1 mmHg — ABNORMAL HIGH (ref 32.0–48.0)
pH, Arterial: 7.355 (ref 7.350–7.450)
pO2, Arterial: 69 mmHg — ABNORMAL LOW (ref 83.0–108.0)

## 2019-10-24 LAB — GLUCOSE, CAPILLARY
Glucose-Capillary: 165 mg/dL — ABNORMAL HIGH (ref 70–99)
Glucose-Capillary: 173 mg/dL — ABNORMAL HIGH (ref 70–99)
Glucose-Capillary: 119 mg/dL — ABNORMAL HIGH (ref 70–99)
Glucose-Capillary: 202 mg/dL — ABNORMAL HIGH (ref 70–99)
Glucose-Capillary: 221 mg/dL — ABNORMAL HIGH (ref 70–99)
Glucose-Capillary: 227 mg/dL — ABNORMAL HIGH (ref 70–99)
Glucose-Capillary: 290 mg/dL — ABNORMAL HIGH (ref 70–99)

## 2019-10-24 LAB — BASIC METABOLIC PANEL
Anion gap: 10 (ref 5–15)
BUN: 146 mg/dL — ABNORMAL HIGH (ref 8–23)
CO2: 32 mmol/L (ref 22–32)
Calcium: 9.6 mg/dL (ref 8.9–10.3)
Chloride: 113 mmol/L — ABNORMAL HIGH (ref 98–111)
Creatinine, Ser: 2.21 mg/dL — ABNORMAL HIGH (ref 0.61–1.24)
GFR calc Af Amer: 36 mL/min — ABNORMAL LOW (ref >60)
GFR calc non Af Amer: 31 mL/min — ABNORMAL LOW (ref >60)
Glucose, Bld: 184 mg/dL — ABNORMAL HIGH (ref 70–99)
Potassium: 5.1 mmol/L (ref 3.5–5.1)
Sodium: 155 mmol/L — ABNORMAL HIGH (ref 135–145)

## 2019-10-24 LAB — CBC
HCT: 35.9 % — ABNORMAL LOW (ref 39.0–52.0)
Hemoglobin: 10.5 g/dL — ABNORMAL LOW (ref 13.0–17.0)
MCH: 24.9 pg — ABNORMAL LOW (ref 26.0–34.0)
MCHC: 29.2 g/dL — ABNORMAL LOW (ref 30.0–36.0)
MCV: 85.3 fL (ref 80.0–100.0)
Platelets: 189 10*3/uL (ref 150–400)
RBC: 4.21 MIL/uL — ABNORMAL LOW (ref 4.22–5.81)
RDW: 16.7 % — ABNORMAL HIGH (ref 11.5–15.5)
WBC: 11.5 10*3/uL — ABNORMAL HIGH (ref 4.0–10.5)
nRBC: 0.3 % — ABNORMAL HIGH (ref 0.0–0.2)

## 2019-10-24 LAB — HEPARIN LEVEL (UNFRACTIONATED)
Heparin Unfractionated: <0.1 IU/mL — ABNORMAL LOW (ref 0.30–0.70)
Heparin Unfractionated: 0.38 IU/mL (ref 0.30–0.70)
Heparin Unfractionated: 1 IU/mL — ABNORMAL HIGH (ref 0.30–0.70)

## 2019-10-24 MED ORDER — VECURONIUM BROMIDE 10 MG IV SOLR
0.1000 mg/kg | INTRAVENOUS | Status: DC | PRN
  Administered 2019-10-24 – 2019-10-29 (×12): 8.5 mg via INTRAVENOUS
  Filled 2019-10-24 (×13): qty 10

## 2019-10-24 MED ORDER — DEXTROSE 5 % IV SOLN
INTRAVENOUS | Status: DC
  Administered 2019-10-24 – 2019-10-27 (×5): via INTRAVENOUS

## 2019-10-24 MED ORDER — FREE WATER
200.0000 mL | Freq: Four times a day (QID) | Status: DC
  Administered 2019-10-24 – 2019-10-25 (×4): 200 mL

## 2019-10-24 NOTE — Progress Notes (Signed)
  Recruitment maneuver done per Elink.. Spo2 in mid 70's. Vent dysynchrony.

## 2019-10-24 NOTE — Progress Notes (Signed)
Dr. Oletta Darter ([facility] physician critical care) was called by nurse due to patient developing hypoxia and blood tinged sputum, he was also noted to have left arm IV site infiltration with edema, patient was stabilized and I was asked to evaluate infiltrated IV site to assess for compartment syndrome. Unfortunately, patient being sedated limits some of the physical findings of acute compartment syndrome, however, though, left arm appear infiltrated, there was no firm "wood-like" feeling and radial pulse was strong, firm and regular. I will recommend serial monitoring of left arm  for worsening extremity girth by nurse with notification of physician for positive findings.

## 2019-10-24 NOTE — Progress Notes (Addendum)
ANTICOAGULATION CONSULT NOTE  Pharmacy Consult for Heparin IV Indication: VTE treatment   Patient Measurements: Height: 5\' 7"  (170.2 cm) Weight: 188 lb 0.8 oz (85.3 kg) IBW/kg (Calculated) : 66.1  Heparin dosing weight: 84 kg  Vital Signs: Temp: 98.6 F (37 C) (12/07 0800) Temp Source: Oral (12/07 0800) BP: 116/54 (12/07 0830) Pulse Rate: 79 (12/07 0830)  Labs: Recent Labs    10/22/19 0315  10/23/19 0636 10/23/19 0657 10/23/19 0950 10/23/19 1734 10/24/19 0429 10/24/19 0819  HGB 11.5*   < > 11.4* 11.2*  --   --  10.5* 9.9*  HCT 38.2*   < > 39.0 33.0*  --   --  35.9* 29.0*  PLT 250  --  197  --   --   --  189  --   HEPARINUNFRC  --    < > <0.10*  --  <0.10* <0.10* <0.10*  --   CREATININE 1.94*  --  2.32*  --   --   --  2.21*  --    < > = values in this interval not displayed.     Assessment: 66 yoM admitted on 11/16 with COVID-19 pneumonia.  He has been on Lovenox for VTE prophylaxis (BID dosing while in ICU from 11/17-11/21).   11/17 Dopplers negative for DVT; 11/22 Dopoplers positive for DVT 11/22 CTa negative for PE. Pharmacy was initially consulted to dose Lovenox for VTE, Lovenox was held on 12/3 for large epistaxis.  Bleeding now resolved after rhinorocket placement on 12/3.  12/6: No further bleeding or epistaxis reported. Rhinorocket removed without complications.  12/7: Heparin level remains low (undetectable), despite rate increases. Level was double-checked yesterday for accuracy. CBC reveals a small drop in hemoglobin today. Night shift RN stated patient had a large blood clot in his nasal discharge this morning. I spoke with the day shift RN, no additional bleeding thus far.   Goal of Therapy:  Anti-Xa level 0.6-1 units/ml 4hrs after LMWH dose given Monitor platelets by anticoagulation protocol: Yes    Plan:  Increase heparin infusion to 2200 units/hr Heparin level in 6 hours Daily heparin level and CBC Continue to monitor for s/s bleeding,  epistaxis.    Harvel Quale 10/24/2019 8:47 AM

## 2019-10-24 NOTE — Progress Notes (Signed)
ANTICOAGULATION CONSULT NOTE  Pharmacy Consult for Heparin IV Indication: VTE treatment   Patient Measurements: Height: 5\' 7"  (170.2 cm) Weight: 188 lb 0.8 oz (85.3 kg) IBW/kg (Calculated) : 66.1  Heparin dosing weight: 84 kg  Vital Signs: Temp: 98.6 F (37 C) (12/07 1200) Temp Source: Oral (12/07 1200) BP: 103/54 (12/07 1600) Pulse Rate: 75 (12/07 1600)  Labs: Recent Labs    10/22/19 0315  10/23/19 0636 10/23/19 0657  10/23/19 1734 10/24/19 0429 10/24/19 0819 10/24/19 1455  HGB 11.5*   < > 11.4* 11.2*  --   --  10.5* 9.9*  --   HCT 38.2*   < > 39.0 33.0*  --   --  35.9* 29.0*  --   PLT 250  --  197  --   --   --  189  --   --   HEPARINUNFRC  --    < > <0.10*  --    < > <0.10* <0.10*  --  0.38  CREATININE 1.94*  --  2.32*  --   --   --  2.21*  --   --    < > = values in this interval not displayed.     Assessment: 75 yoM admitted on 11/16 with COVID-19 pneumonia.  He has been on Lovenox for VTE prophylaxis (BID dosing while in ICU from 11/17-11/21).   11/17 Dopplers negative for DVT; 11/22 Dopoplers positive for DVT 11/22 CTa negative for PE. Pharmacy was initially consulted to dose Lovenox for VTE, Lovenox was held on 12/3 for large epistaxis.  Bleeding now resolved after rhinorocket placement on 12/3.  12/6: No further bleeding or epistaxis reported. Rhinorocket removed without complications.  12/7: Heparin level remains low (undetectable), despite rate increases. Level was double-checked yesterday for accuracy. CBC reveals a small drop in hemoglobin today. Night shift RN stated patient had a large blood clot in his nasal discharge this morning. I spoke with the day shift RN, no additional bleeding thus far.  Heparin level came back therapeutic this PM. We will repeat to confirm tonight. We will continue to monitor the bleed.   Goal of Therapy:  Heparin level 0.3-0.7 Monitor platelets by anticoagulation protocol: Yes    Plan:  Continue heparin infusion 2200  units/hr Repeal heparin level in 6 hours Daily heparin level and CBC Continue to monitor for s/s bleeding, epistaxis.  Onnie Boer, PharmD, BCIDP, AAHIVP, CPP Infectious Disease Pharmacist 10/24/2019 4:25 PM

## 2019-10-24 NOTE — Progress Notes (Signed)
Cleaned patient with CHG and perineal wipes this morning.  Performed Q@ turns and assessed areas for bleeding and abrasions.  Mouth care performed.  Rounded with physicians and coordinated plan of care for coming therapy.

## 2019-10-24 NOTE — Progress Notes (Signed)
Patient returned to supine position.  ETT secured 25 cm @lip  with commercial tube holder.  Tolerated well.

## 2019-10-24 NOTE — Progress Notes (Signed)
Wasted 260ml of Fentanyl IV bag because of perforation after spiking bag; Witness: Henry Russel RN

## 2019-10-24 NOTE — Progress Notes (Signed)
Pt's head turned at this time w/o complication 

## 2019-10-24 NOTE — Progress Notes (Signed)
[place] Progress Note Patient Name: William [name] DOB: 1956/06/16 MRN: [id number]   Date of Service  10/24/2019  HPI/Events of Note  I do not appreciate a pneumothorax on the STAT portable CXR.  Emphysema and pneumomediastinum  Persist. Await review by radiology.   eICU Interventions  Continue current management.      Intervention Category Intermediate Interventions: Diagnostic test evaluation  Sommer,Steven Cornelia Copa 10/24/2019, 10:24 PM

## 2019-10-24 NOTE — Progress Notes (Signed)
Patient maintained well today.  Performed mouth care q2hrs.  Turned q2 hr to tx stage II pressure ulcers to bilateral buttocks.. Changed mepilex. Wiped with CHG.  Changed gown and linen.  Physician rounded and we discussed further respiratory tx.  Continue on the heparin drip at 2200u/hr. Fentanyl 175 mcg/hr, Versed 3.5mg /hr.  No heavy secretions.  Patient tolerated vent well. Rass -3. Vital nutrition 1.5 76ml/hr. Changed at 1800.

## 2019-10-24 NOTE — Progress Notes (Addendum)
NAME:  William [name], MRN:  [id number], DOB:  [birth year], LOS: 21 ADMISSION DATE:  09/26/2019, CONSULTATION DATE:  12/3 REFERRING MD:  Cathlean Sauer, CHIEF COMPLAINT:  Dyspnea, epistaxis   Brief History   63 y/o male admitted on 11/16 with severe acute respiratory failure with hypoxemia due to COVID 19 pneumonia. He was treated for three weeks with standard therapy, but did not have improvement in oxygenation.  On 12/3 he developed epistaxis and worsening hypoxemia and was moved to the ICU.  Past Medical History  Hypertension CAD DM2 NSTEMI Sickle cell trait  Significant Hospital Events   11/16 admit, treated with heated high flow 11/19 move from PCU 12/03 back to ICU epistaxis 12/05 cuff blown on ETT >> replaced ETT; resumed anticoagulation 12/06 removed nasal packing, PaO2:FiO2 80.  FiO2 100%, PEEP 14.  No further epistaxis.  Consults:    Procedures:  12/3 ETT >>  Significant Diagnostic Tests:  CTA chest 11/22  >> neg for PE, extensive pneumonia findings BLE Venous Duplex 11/22 >> positive for BLE DVT  Micro Data:  SARS COV 2 11/15 >> positive BCx2 11/16 >> negative  Antimicrobials/COVID Rx  Remdesivir 11/16 > 11/19 Ceftriaxone/azithro x1 11/16 Tocilizumab 11/17  Interim history/subjective:  Afebrile.  I/O- UOP 2.0L in 24h, net neg 1.1L in 24h. 100% FiO2, PEEP 14, Peak 44, Plat 28, PCV ventilation, supine 0600  Objective   Blood pressure (!) 116/54, pulse 79, temperature 98.6 F (37 C), temperature source Oral, resp. rate (!) 27, height 5\' 7"  (1.702 m), weight 85.3 kg, SpO2 95 %.    Vent Mode: PCV FiO2 (%):  [100 %] 100 % Set Rate:  [32 bmp] 32 bmp PEEP:  [14 cmH20] 14 cmH20 Plateau Pressure:  [34 cmH20-47 cmH20] 34 cmH20   Intake/Output Summary (Last 24 hours) at 10/24/2019 [id number] Last data filed at 10/24/2019 0800 Gross per 24 hour  Intake 2218.24 ml  Output 2390 ml  Net -171.76 ml   Filed Weights   10/23/19 0600 10/24/19 0630 10/24/19 0800  Weight: 89.2 kg  88.1 kg 85.3 kg    Examination: General: adult male lying in bed on vent in NAD HEENT: MM pink/moist, ETT, no epistaxis Neuro: sedate on vent, pupils =/reactive CV: s1s2 rrr, no m/r/g PULM:  Non-labored on vent, synchronous, lungs bilaterally distant but clear, small amt palpable SQ air on right chest GI: soft, bsx4 active  Extremities: warm/dry, no edema  Skin: no rashes or lesions  CXR 12/7 >> images personally reviewed, new pneumomediastinum and chest wall emphysema, no visible pneumothorax, improved airspace disease, ETT in good position  Resolved Hospital Problem list     Assessment & Plan:   Acute hypoxic, hypercapnic respiratory failure from COVID 19 pneumonia complicated by epistaxis and concern for aspiration Pneumomediastinum  Required nasal packing, removed 12/6.  -continue flonase, saline nasal spray -low Vt ventilation 4-8cc/kg -goal plateau pressure <30, driving pressure [id number] cm H2O -target PaO2 55-65, titrate PEEP/FiO2 per ARDS protocol  -hold further prone therapy with pneumomediastinum -VAP prevention measures  -follow intermittent CXR, ABG -prednisone wean to off, last dose [id number]  Acute metabolic encephalopathy -RASS goal -3 -continue oxycodone, klonopin   AKI on CKD 3 Hyperkalemia Hypernatremia -Trend BMP / urinary output -Replace electrolytes as indicated -Avoid nephrotoxic agents, ensure adequate renal perfusion -Free water 200 ml Q6 -Add D5W at 52ml/hr  Hx of CAD s/p stent 2015, HTN, HLD -continue lipitor, coreg -hold outpatient norvasc, valsartan, HCTZ  Acute B/L lower leg DVT from 10/09/19 -continue heparin gtt -if  no further bleeding episodes or need for procedures (chest tube), transition to enteral anticoagulation 12/8  DM II -SSI with Levemir   Severe Protein Calorie Malnutrition.  -TF per nutrition  Best practice:  Diet: TF DVT prophylaxis: heparin gtt GI prophylaxis: famotidine Mobility: bed rest Code Status: full code  Disposition: ICU Family: Wife (Sybil) updated via phone 12/7  Labs    CMP Latest Ref Rng & Units 10/24/2019 10/24/2019 10/23/2019  Glucose 70 - 99 mg/dL - 184(H) -  BUN 8 - 23 mg/dL - 146(H) -  Creatinine 0.61 - 1.24 mg/dL - 2.21(H) -  Sodium 135 - 145 mmol/L 151(H) 155(H) 147(H)  Potassium 3.5 - 5.1 mmol/L 4.6 5.1 5.6(H)  Chloride 98 - 111 mmol/L - 113(H) -  CO2 22 - 32 mmol/L - 32 -  Calcium 8.9 - 10.3 mg/dL - 9.6 -  Total Protein 6.5 - 8.1 g/dL - - -  Total Bilirubin 0.3 - 1.2 mg/dL - - -  Alkaline Phos 38 - 126 U/L - - -  AST 15 - 41 U/L - - -  ALT 0 - 44 U/L - - -    CBC Latest Ref Rng & Units 10/24/2019 10/24/2019 10/23/2019  WBC 4.0 - 10.5 K/uL - 11.5(H) -  Hemoglobin 13.0 - 17.0 g/dL 9.9(L) 10.5(L) 11.2(L)  Hematocrit 39.0 - 52.0 % 29.0(L) 35.9(L) 33.0(L)  Platelets 150 - 400 K/uL - 189 -    ABG    Component Value Date/Time   PHART 7.355 10/24/2019 0819   PCO2ART 59.1 (H) 10/24/2019 0819   PO2ART 69.0 (L) 10/24/2019 0819   HCO3 33.0 (H) 10/24/2019 0819   TCO2 35 (H) 10/24/2019 0819   O2SAT 92.0 10/24/2019 0819    CBG (last 3)  Recent Labs    10/23/19 2339 10/24/19 0343 10/24/19 0728  GLUCAP 255* 165* 173*    CC Time: 30 minutes  Noe Gens, MSN, NP-C [facility] 10/24/2019, 8:39 AM   Please see [email or web address] for pager details.

## 2019-10-24 NOTE — Progress Notes (Signed)
[place] Progress Note Patient Name: William [name] DOB: 27-Oct-1956 MRN: [id number]   Date of Service  10/24/2019  HPI/Events of Note  Multiplle issues: 1. Hypoxia - Sats dropped into low 70's. Blood tinged sputum - Patient on a Heparin IV infusion for DVT. Reluactant to stop Heparin unless frank blood. 2. Upper arm IV site infiltration with edema of upper arm.   eICU Interventions  Will order: 1. Portable CXR STAT. 2. Increase PEEP to 16.  3. Recruitment maneuver now and PRN.  4. Vecuronium 0.1 mg/kg IV Q 1 hour PRN ventilator asynchrony.  5. Nursing to ask Hospitalist to evaluate the infiltrated IV site to assess for compartment syndrome.  6. D/C Infiltrated Periferal IV site and establish a new IV site.      Intervention Category Major Interventions: Hypoxemia - evaluation and management  Sommer,Steven Eugene 10/24/2019, 10:01 PM

## 2019-10-25 ENCOUNTER — Inpatient Hospital Stay (HOSPITAL_COMMUNITY): Payer: Commercial Managed Care - PPO

## 2019-10-25 DIAGNOSIS — I5032 Chronic diastolic (congestive) heart failure: Secondary | ICD-10-CM | POA: Diagnosis not present

## 2019-10-25 DIAGNOSIS — N179 Acute kidney failure, unspecified: Secondary | ICD-10-CM | POA: Diagnosis not present

## 2019-10-25 DIAGNOSIS — I10 Essential (primary) hypertension: Secondary | ICD-10-CM

## 2019-10-25 DIAGNOSIS — E118 Type 2 diabetes mellitus with unspecified complications: Secondary | ICD-10-CM

## 2019-10-25 DIAGNOSIS — J9601 Acute respiratory failure with hypoxia: Secondary | ICD-10-CM | POA: Diagnosis not present

## 2019-10-25 DIAGNOSIS — U071 COVID-19: Secondary | ICD-10-CM | POA: Diagnosis not present

## 2019-10-25 DIAGNOSIS — E875 Hyperkalemia: Secondary | ICD-10-CM

## 2019-10-25 DIAGNOSIS — E1165 Type 2 diabetes mellitus with hyperglycemia: Secondary | ICD-10-CM

## 2019-10-25 LAB — GLUCOSE, CAPILLARY
Glucose-Capillary: 173 mg/dL — ABNORMAL HIGH (ref 70–99)
Glucose-Capillary: 204 mg/dL — ABNORMAL HIGH (ref 70–99)
Glucose-Capillary: 218 mg/dL — ABNORMAL HIGH (ref 70–99)
Glucose-Capillary: 226 mg/dL — ABNORMAL HIGH (ref 70–99)
Glucose-Capillary: 229 mg/dL — ABNORMAL HIGH (ref 70–99)
Glucose-Capillary: 235 mg/dL — ABNORMAL HIGH (ref 70–99)
Glucose-Capillary: 243 mg/dL — ABNORMAL HIGH (ref 70–99)

## 2019-10-25 LAB — CBC
HCT: 31.4 % — ABNORMAL LOW (ref 39.0–52.0)
Hemoglobin: 9.2 g/dL — ABNORMAL LOW (ref 13.0–17.0)
MCH: 25.3 pg — ABNORMAL LOW (ref 26.0–34.0)
MCHC: 29.3 g/dL — ABNORMAL LOW (ref 30.0–36.0)
MCV: 86.3 fL (ref 80.0–100.0)
Platelets: 158 10*3/uL (ref 150–400)
RBC: 3.64 MIL/uL — ABNORMAL LOW (ref 4.22–5.81)
RDW: 16.5 % — ABNORMAL HIGH (ref 11.5–15.5)
WBC: 8.7 10*3/uL (ref 4.0–10.5)
nRBC: 0.3 % — ABNORMAL HIGH (ref 0.0–0.2)

## 2019-10-25 LAB — BASIC METABOLIC PANEL
Anion gap: 8 (ref 5–15)
BUN: 136 mg/dL — ABNORMAL HIGH (ref 8–23)
CO2: 33 mmol/L — ABNORMAL HIGH (ref 22–32)
Calcium: 9.2 mg/dL (ref 8.9–10.3)
Chloride: 115 mmol/L — ABNORMAL HIGH (ref 98–111)
Creatinine, Ser: 2.43 mg/dL — ABNORMAL HIGH (ref 0.61–1.24)
GFR calc Af Amer: 32 mL/min — ABNORMAL LOW (ref >60)
GFR calc non Af Amer: 27 mL/min — ABNORMAL LOW (ref >60)
Glucose, Bld: 238 mg/dL — ABNORMAL HIGH (ref 70–99)
Potassium: 5.4 mmol/L — ABNORMAL HIGH (ref 3.5–5.1)
Sodium: 156 mmol/L — ABNORMAL HIGH (ref 135–145)

## 2019-10-25 LAB — HEPARIN LEVEL (UNFRACTIONATED): Heparin Unfractionated: 1.24 IU/mL — ABNORMAL HIGH (ref 0.30–0.70)

## 2019-10-25 MED ORDER — INSULIN DETEMIR 100 UNIT/ML ~~LOC~~ SOLN
30.0000 [IU] | Freq: Every day | SUBCUTANEOUS | Status: DC
  Administered 2019-10-26: 30 [IU] via SUBCUTANEOUS
  Filled 2019-10-25 (×2): qty 0.3

## 2019-10-25 MED ORDER — HEPARIN (PORCINE) 25000 UT/250ML-% IV SOLN
1600.0000 [IU]/h | INTRAVENOUS | Status: DC
  Administered 2019-10-25: 1600 [IU]/h via INTRAVENOUS

## 2019-10-25 MED ORDER — ACETAMINOPHEN 325 MG PO TABS
650.0000 mg | ORAL_TABLET | Freq: Once | ORAL | Status: AC
  Administered 2019-10-25: 650 mg via ORAL

## 2019-10-25 MED ORDER — SODIUM ZIRCONIUM CYCLOSILICATE 5 G PO PACK
5.0000 g | PACK | Freq: Two times a day (BID) | ORAL | Status: AC
  Administered 2019-10-25 (×2): 5 g via ORAL
  Filled 2019-10-25 (×2): qty 1

## 2019-10-25 MED ORDER — HEPARIN (PORCINE) 25000 UT/250ML-% IV SOLN
1900.0000 [IU]/h | INTRAVENOUS | Status: DC
  Administered 2019-10-25: 1900 [IU]/h via INTRAVENOUS
  Filled 2019-10-25: qty 250

## 2019-10-25 MED ORDER — FREE WATER
200.0000 mL | Status: DC
  Administered 2019-10-25 – 2019-10-26 (×6): 200 mL

## 2019-10-25 MED ORDER — HEPARIN (PORCINE) 25000 UT/250ML-% IV SOLN: 1600.0000 [IU]/h | INTRAVENOUS | Status: DC

## 2019-10-25 NOTE — Progress Notes (Signed)
ANTICOAGULATION CONSULT NOTE  Pharmacy Consult for Heparin IV Indication: VTE treatment   Patient Measurements: Height: 5\' 7"  (170.2 cm) Weight: 188 lb 0.8 oz (85.3 kg) IBW/kg (Calculated) : 66.1  Heparin dosing weight: 84 kg  Vital Signs: Temp: 103.5 F (39.7 C) (12/08 0045) Temp Source: Axillary (12/08 0045) BP: 124/61 (12/08 0045) Pulse Rate: 104 (12/08 0045)  Labs: Recent Labs    10/22/19 0315  10/23/19 0636 10/23/19 0657  10/24/19 0429 10/24/19 0819 10/24/19 1455 10/24/19 2145  HGB 11.5*   < > 11.4* 11.2*  --  10.5* 9.9*  --   --   HCT 38.2*   < > 39.0 33.0*  --  35.9* 29.0*  --   --   PLT 250  --  197  --   --  189  --   --   --   HEPARINUNFRC  --    < > <0.10*  --    < > <0.10*  --  0.38 1.00*  CREATININE 1.94*  --  2.32*  --   --  2.21*  --   --   --    < > = values in this interval not displayed.     Assessment: 18 yoM admitted on 11/16 with COVID-19 pneumonia.  He has been on Lovenox for VTE prophylaxis (BID dosing while in ICU from 11/17-11/21).   11/17 Dopplers negative for DVT; 11/22 Dopoplers positive for DVT 11/22 CTa negative for PE. Pharmacy was initially consulted to dose Lovenox for VTE, Lovenox was held on 12/3 for large epistaxis.  Bleeding now resolved after rhinorocket placement on 12/3.  12/6: No further bleeding or epistaxis reported. Rhinorocket removed without complications.  12/7: Heparin level remains low (undetectable), despite rate increases. Level was double-checked yesterday for accuracy. CBC reveals a small drop in hemoglobin today. Night shift RN stated patient had a large blood clot in his nasal discharge this morning. I spoke with the day shift RN, no additional bleeding thus far.  12/7 1600 Heparin level came back therapeutic this PM. We will repeat to confirm tonight. We will continue to monitor the bleed.   10/24/19 2300 UPDATE Confirmatory HL:  1.00 IU/mL at 2250, above goal range RN reports blood-tinged secretions from  lungs and sinus passages Left arm IV site infiltrated with  edema up to left  armpit; subsequently changed   Goal of Therapy:  Heparin level 0.3-0.7 Monitor platelets by anticoagulation protocol: Yes   Plan:  HOLD heparin infusion x1 hour  Re-start  heparin at 1900 units/hr Re-check heparin level in 6-8  hours Daily heparin level (once therapeutic) and CBC Continue to monitor for s/s bleeding, epistaxis.  Despina Pole, Pharm. D. Clinical Pharmacist 10/25/2019 1:14 AM

## 2019-10-25 NOTE — Progress Notes (Signed)
NAME:  William [name], MRN:  [id number], DOB:  1956-02-25, LOS: 92 ADMISSION DATE:  10/10/2019, CONSULTATION DATE:  12/3 REFERRING MD:  Cathlean Sauer, CHIEF COMPLAINT:  Dyspnea, epistaxis   Brief History   63 y/o male admitted on 11/16 with severe acute respiratory failure with hypoxemia due to COVID 19 pneumonia. He was treated for three weeks with standard therapy, but did not have improvement in oxygenation.  On 12/3 he developed epistaxis and worsening hypoxemia and was moved to the ICU.  Past Medical History  Hypertension CAD DM2 NSTEMI Sickle cell trait  Significant Hospital Events   11/16 admit, treated with heated high flow 11/19 move from PCU 12/03 back to ICU epistaxis, intubated 12/05 cuff blown on ETT >> replaced ETT; resumed anticoagulation 12/06 removed nasal packing, PaO2:FiO2 80.  FiO2 100%, PEEP 14.  No further epistaxis. 12/07 100% FiO2, PEEP 14, Peak 44, Plat 28, PCV ventilation, supine 0600  Consults:    Procedures:  12/3 ETT >>  Significant Diagnostic Tests:  CTA chest 11/22  >> neg for PE, extensive pneumonia findings BLE Venous Duplex 11/22 >> positive for BLE DVT  Micro Data:  SARS COV 2 11/15 >> positive BCx2 11/16 >> negative  Antimicrobials/COVID Rx  Remdesivir 11/16 > 11/19 Ceftriaxone/azithro x1 11/16 Tocilizumab 11/17  Interim history/subjective:  Tmax 103.5. I/O- 3.3L UOP 24h, essentially net even for 24.  Episode of desaturation overnight with blood tinged secretions from ETT. No other acute events.   Objective   Blood pressure (!) 107/53, pulse 78, temperature (!) 100.8 F (38.2 C), temperature source Axillary, resp. rate (!) 25, height 5\' 7"  (1.702 m), weight 89.8 kg, SpO2 95 %.    Vent Mode: PCV FiO2 (%):  [100 %] 100 % Set Rate:  [32 bmp] 32 bmp PEEP:  [14 cmH20-16 cmH20] 16 cmH20 Plateau Pressure:  [35 [id number] cmH20] 38 cmH20   Intake/Output Summary (Last 24 hours) at 10/25/2019 0845 Last data filed at 10/25/2019 0700 Gross per  24 hour  Intake 2886.28 ml  Output 3095 ml  Net -208.72 ml   Filed Weights   10/24/19 0630 10/24/19 0800 10/25/19 0436  Weight: 88.1 kg 85.3 kg 89.8 kg    Examination: General: critically ill appearing adult male lying in bed on vent in NAD HEENT: MM pink/moist, ETT, no jvd, medial R eye subconjunctival hemorrhage, pupils =/reactive   Neuro: sedate CV: s1s2 rrr, no m/r/g PULM:  Even/non-labored on vent, synchronous, coarse bilaterally, scant bloody secretions from in-line ballard upon suctioning  GI: soft, bsx4 active  Extremities: warm/dry, no edema  Skin: no rashes or lesions  CXR 12/8 >> images personally reviewed, persistent bilateral infiltrates  Resolved Hospital Problem list     Assessment & Plan:   Acute hypoxic, hypercapnic respiratory failure from COVID 19 pneumonia complicated by epistaxis and concern for aspiration Pneumomediastinum  Required nasal packing, removed 12/6.  -continue flonase, saline nasal spray  -low Vt ventilation 4-8cc/kg -goal plateau pressure <30, driving pressure [id number] cm H2O -target PaO2 55-65, titrate PEEP/FiO2 per ARDS protocol  -hold further prone therapy with recent pneumomediastinum  -goal CVP <4, diuresis as necessary -VAP prevention measures  -follow intermittent CXR, ABG  -wean prednisone to off, last dose [id number]  Acute metabolic encephalopathy Need for Sedation secondary to Mechanical Ventilation -RASS goal -3, if NMB -4 to -5 -continue klonopin, oxycodone   AKI on CKD 3 Hyperkalemia Hypernatremia -increase free water 246ml Q4 -D5w at 13ml/hr  -Trend BMP / urinary output -Replace electrolytes as indicated -Avoid nephrotoxic  agents, ensure adequate renal perfusion  Anemia  Suspect of critical illness -trend CBC -transfuse for Hgb <7%, active bleeding   Hx of CAD s/p stent 2015, HTN, HLD -continue coreg, lipitor  -hold home norvasc, HCTZ, vaslartan  Acute B/L lower leg DVT from 10/09/19 -heparin gtt per pharmacy   -consider transition to enteral anticoagulation once no evidence of further bleeding from ETT 12/9  DM II -SSI + Levemir   Severe Protein Calorie Malnutrition.  -TF per Nutrition   Best practice:  Diet: TF DVT prophylaxis: heparin gtt GI prophylaxis: famotidine Mobility: bed rest Code Status: full code Disposition: ICU Family: Wife (Sybil) updated via phone 12/9.  She requests that Dr. Lake Bells give her a call on 12/9  Labs    CMP Latest Ref Rng & Units 10/25/2019 10/24/2019 10/24/2019  Glucose 70 - 99 mg/dL 238(H) - 184(H)  BUN 8 - 23 mg/dL 136(H) - 146(H)  Creatinine 0.61 - 1.24 mg/dL 2.43(H) - 2.21(H)  Sodium 135 - 145 mmol/L 156(H) 151(H) 155(H)  Potassium 3.5 - 5.1 mmol/L 5.4(H) 4.6 5.1  Chloride 98 - 111 mmol/L 115(H) - 113(H)  CO2 22 - 32 mmol/L 33(H) - 32  Calcium 8.9 - 10.3 mg/dL 9.2 - 9.6  Total Protein 6.5 - 8.1 g/dL - - -  Total Bilirubin 0.3 - 1.2 mg/dL - - -  Alkaline Phos 38 - 126 U/L - - -  AST 15 - 41 U/L - - -  ALT 0 - 44 U/L - - -    CBC Latest Ref Rng & Units 10/25/2019 10/24/2019 10/24/2019  WBC 4.0 - 10.5 K/uL 8.7 - 11.5(H)  Hemoglobin 13.0 - 17.0 g/dL 9.2(L) 9.9(L) 10.5(L)  Hematocrit 39.0 - 52.0 % 31.4(L) 29.0(L) 35.9(L)  Platelets 150 - 400 K/uL 158 - 189    ABG    Component Value Date/Time   PHART 7.355 10/24/2019 0819   PCO2ART 59.1 (H) 10/24/2019 0819   PO2ART 69.0 (L) 10/24/2019 0819   HCO3 33.0 (H) 10/24/2019 0819   TCO2 35 (H) 10/24/2019 0819   O2SAT 92.0 10/24/2019 0819    CBG (last 3)  Recent Labs    10/25/19 0044 10/25/19 0410 10/25/19 0812  GLUCAP 218* 229* 173*    CC Time: 30 minutes  Noe Gens, MSN, NP-C [facility] 10/25/2019, 8:45 AM   Please see [email or web address] for pager details.

## 2019-10-25 NOTE — Progress Notes (Signed)
Pt's Spouse, Sybil, called for update on pt.  Temperature remains elevated even after dose of tylenol at 1700 today.  Applying some ice packs now and will give tepid bath soon.  No Vec administered since earlier in Afternoon.  Sats staying 88 to 93% at this time.  [name] is requesting that Provider call her in AM to update on status and plan of care.  Also requesting for [place] to playing some [place] or [place] Remember the [employer] type music when can to help ground the pt.  Will do as requested once bath is completed.

## 2019-10-25 NOTE — Progress Notes (Signed)
Witnessed 40 cc of fentanyl wasted by Judithe Modest, [place] RN

## 2019-10-25 NOTE — Progress Notes (Signed)
ANTICOAGULATION CONSULT NOTE  Pharmacy Consult for Heparin Indication: VTE treatment   Patient Measurements: Height: 5\' 7"  (170.2 cm) Weight: 197 lb 15.6 oz (89.8 kg) IBW/kg (Calculated) : 66.1  Heparin dosing weight: 84 kg  Vital Signs: Temp: 99.2 F (37.3 C) (12/08 0800) Temp Source: Axillary (12/08 0800) BP: 97/51 (12/08 1000) Pulse Rate: 74 (12/08 1000)  Labs: Recent Labs    10/23/19 0636  10/24/19 0429 10/24/19 0819 10/24/19 1455 10/24/19 2145 10/25/19 0630  HGB 11.4*   < > 10.5* 9.9*  --   --  9.2*  HCT 39.0   < > 35.9* 29.0*  --   --  31.4*  PLT 197  --  189  --   --   --  158  HEPARINUNFRC <0.10*   < > <0.10*  --  0.38 1.00* 1.24*  CREATININE 2.32*  --  2.21*  --   --   --  2.43*   < > = values in this interval not displayed.     Assessment: 7 yoM admitted on 11/16 with COVID-19 pneumonia.  He has been on Lovenox for VTE prophylaxis (BID dosing while in ICU from 11/17-11/21).   11/17: Dopplers negative for DVT 11/22: Dopoplers positive for DVT, CTa negative for PE.  Pharmacy was initially consulted to dose Lovenox for VTE, Lovenox was held on 12/3 for large epistaxis.  Bleeding now resolved after rhinorocket placement on 12/3.  12/6: No further bleeding or epistaxis reported. Rhinorocket removed without complications.  12/7: Heparin level remains low (undetectable), despite rate increases. Level was double-checked yesterday for accuracy. CBC reveals a small drop in hemoglobin today. Night shift RN stated patient had a large blood clot in his nasal discharge this morning. I spoke with the day shift RN, no additional bleeding thus far.  12/7 1600 Heparin level came back therapeutic this PM. We will repeat to confirm tonight. We will continue to monitor the bleed.   10/24/19  PM: 1.00 IU/mL at 2250, above goal range RN reports blood-tinged secretions from lungs and sinus passages Left arm IV site infiltrated with  edema up to left  armpit; subsequently  changed  12/8 AM: Heparin infiltrated yesterday, pt now with ecchymosis over abdomen and a red eye. Discussed plan for heparin with RN.   Goal of Therapy:  Heparin level 0.3-0.7 Monitor platelets by anticoagulation protocol: Yes   Plan:  HOLD heparin infusion until 1500 Re-start  heparin at 1600 units/hr Re-check heparin level in 6 hours Daily heparin level and CBC Monitor for epistaxis, abdomen and for other bleeding    Harvel Quale 10/25/2019 12:10 PM

## 2019-10-25 NOTE — Progress Notes (Signed)
Pt's spouse, [name], called again.  She is feeling anxious tonight and worried about husband.  Inquired how bath went.  Nurse stated about to get bathe but had had ice packs on body to assist with temp along with early dose (per md order) tylenol but temperature remains mostly unchanged.  [name] stated will attempt to sleep tonight but still wants provider to call her tomorrow to discuss plan of care and xrays and such with him/her.

## 2019-10-25 NOTE — Progress Notes (Signed)
Fentanyl gtt tubing changed when new bag spiked. 40 cc Fentanyl wasted in [employer]. Soyla Dryer, RN witnessed.

## 2019-10-25 NOTE — Progress Notes (Signed)
Pt's wife updated on pt condition and plan of care. Questions answered.

## 2019-10-25 NOTE — Progress Notes (Signed)
Pt's spouse, [name], called for udpated.  Pt requiring some paralytics to tolerate ventilator otherwise, sats dropping below 80%.  Sybil requested for [place] to play at times for pt some of his favorite soungs via Youtube when possible:  [employer] Breezing, or [employer] in the [place] or [employer] the [employer].  Will attempt to make this happen for pt while sedated on vent. Sybil also requesting for Provider to call her in AM to review xrays/plan of care.  Reviewed with pt's spouse the changes as well as to the vent to help with improving his oxygen level;.  Will continue to monitor pt and update family as appropriate.

## 2019-10-25 NOTE — Plan of Care (Signed)
At beginning part of shift, pt's sats starting dropping from 92-93% to high 80s.  Pt starting coughing and able to suction some thick, tan/blood streaked secrtions from ETT and oral pharanygeal area.  Pt continue to drop sats, noted some asyncrhonicity issue with vent.  Pt received 3 total doses of Vecuronium IVP per order for this.  After each dose, pt slowly recovery to 90% sats. Heparin gtt off per protocol d/t elevated unfractionated heparin level.  This occurred after the last dose of Vec administered approximately 0020.  Since this dose, pt has not requried any Vec for vent asynchrony.   Monitor showed ST to SR without ectopy at this time.  Pt remained on Fentanyl and Versed gtts for sedation while on vent.  TF remain infusing as per MD order.  Left Arm edeamtoma d/t IV Infiltrate.  Problem: Coping: Goal: Psychosocial and spiritual needs will be supported Outcome: Not Progressing   Problem: Respiratory: Goal: Complications related to the disease process, condition or treatment will be avoided or minimized Outcome: Not Progressing   Problem: Clinical Measurements: Goal: Respiratory complications will improve Outcome: Not Progressing   Problem: Activity: Goal: Risk for activity intolerance will decrease Outcome: Not Progressing

## 2019-10-26 ENCOUNTER — Inpatient Hospital Stay (HOSPITAL_COMMUNITY): Payer: Commercial Managed Care - PPO

## 2019-10-26 ENCOUNTER — Inpatient Hospital Stay: Payer: Self-pay

## 2019-10-26 LAB — CBC
HCT: 29.1 % — ABNORMAL LOW (ref 39.0–52.0)
Hemoglobin: 8.4 g/dL — ABNORMAL LOW (ref 13.0–17.0)
MCH: 25.2 pg — ABNORMAL LOW (ref 26.0–34.0)
MCHC: 28.9 g/dL — ABNORMAL LOW (ref 30.0–36.0)
MCV: 87.4 fL (ref 80.0–100.0)
Platelets: 148 K/uL — ABNORMAL LOW (ref 150–400)
RBC: 3.33 MIL/uL — ABNORMAL LOW (ref 4.22–5.81)
RDW: 16.6 % — ABNORMAL HIGH (ref 11.5–15.5)
WBC: 8.9 K/uL (ref 4.0–10.5)
nRBC: 0.3 % — ABNORMAL HIGH (ref 0.0–0.2)

## 2019-10-26 LAB — COMPREHENSIVE METABOLIC PANEL
ALT: 21 U/L (ref 0–44)
AST: 24 U/L (ref 15–41)
Albumin: 1.4 g/dL — ABNORMAL LOW (ref 3.5–5.0)
Alkaline Phosphatase: 77 U/L (ref 38–126)
Anion gap: 6 (ref 5–15)
BUN: 144 mg/dL — ABNORMAL HIGH (ref 8–23)
CO2: 32 mmol/L (ref 22–32)
Calcium: 8.9 mg/dL (ref 8.9–10.3)
Chloride: 115 mmol/L — ABNORMAL HIGH (ref 98–111)
Creatinine, Ser: 2.3 mg/dL — ABNORMAL HIGH (ref 0.61–1.24)
GFR calc Af Amer: 34 mL/min — ABNORMAL LOW (ref >60)
GFR calc non Af Amer: 29 mL/min — ABNORMAL LOW (ref >60)
Glucose, Bld: 299 mg/dL — ABNORMAL HIGH (ref 70–99)
Potassium: 5.4 mmol/L — ABNORMAL HIGH (ref 3.5–5.1)
Sodium: 153 mmol/L — ABNORMAL HIGH (ref 135–145)
Total Bilirubin: 0.4 mg/dL (ref 0.3–1.2)
Total Protein: 6 g/dL — ABNORMAL LOW (ref 6.5–8.1)

## 2019-10-26 LAB — POCT I-STAT 7, (LYTES, BLD GAS, ICA,H+H)
Acid-Base Excess: 6 mmol/L — ABNORMAL HIGH (ref 0.0–2.0)
Acid-Base Excess: 6 mmol/L — ABNORMAL HIGH (ref 0.0–2.0)
Bicarbonate: 33.4 mmol/L — ABNORMAL HIGH (ref 20.0–28.0)
Bicarbonate: 32 mmol/L — ABNORMAL HIGH (ref 20.0–28.0)
Calcium, Ion: 1.37 mmol/L (ref 1.15–1.40)
Calcium, Ion: 1.33 mmol/L (ref 1.15–1.40)
HCT: 24 % — ABNORMAL LOW (ref 39.0–52.0)
HCT: 25 % — ABNORMAL LOW (ref 39.0–52.0)
Hemoglobin: 8.2 g/dL — ABNORMAL LOW (ref 13.0–17.0)
Hemoglobin: 8.5 g/dL — ABNORMAL LOW (ref 13.0–17.0)
O2 Saturation: 89 %
O2 Saturation: 90 %
Patient temperature: 36.8
Patient temperature: 37.1
Potassium: 5.2 mmol/L — ABNORMAL HIGH (ref 3.5–5.1)
Potassium: 5 mmol/L (ref 3.5–5.1)
Sodium: 150 mmol/L — ABNORMAL HIGH (ref 135–145)
Sodium: 151 mmol/L — ABNORMAL HIGH (ref 135–145)
TCO2: 35 mmol/L — ABNORMAL HIGH (ref 22–32)
TCO2: 34 mmol/L — ABNORMAL HIGH (ref 22–32)
pCO2 arterial: 70.4 mmHg (ref 32.0–48.0)
pCO2 arterial: 56.1 mmHg — ABNORMAL HIGH (ref 32.0–48.0)
pH, Arterial: 7.283 — ABNORMAL LOW (ref 7.350–7.450)
pH, Arterial: 7.364 (ref 7.350–7.450)
pO2, Arterial: 65 mmHg — ABNORMAL LOW (ref 83.0–108.0)
pO2, Arterial: 62 mmHg — ABNORMAL LOW (ref 83.0–108.0)

## 2019-10-26 LAB — HEPARIN LEVEL (UNFRACTIONATED)
Heparin Unfractionated: 1.46 IU/mL — ABNORMAL HIGH (ref 0.30–0.70)
Heparin Unfractionated: 1.06 IU/mL — ABNORMAL HIGH (ref 0.30–0.70)

## 2019-10-26 LAB — GLUCOSE, CAPILLARY
Glucose-Capillary: 150 mg/dL — ABNORMAL HIGH (ref 70–99)
Glucose-Capillary: 234 mg/dL — ABNORMAL HIGH (ref 70–99)
Glucose-Capillary: 269 mg/dL — ABNORMAL HIGH (ref 70–99)
Glucose-Capillary: 273 mg/dL — ABNORMAL HIGH (ref 70–99)
Glucose-Capillary: 278 mg/dL — ABNORMAL HIGH (ref 70–99)
Glucose-Capillary: 287 mg/dL — ABNORMAL HIGH (ref 70–99)

## 2019-10-26 MED ORDER — SODIUM CHLORIDE 0.9% FLUSH: 10.0000 mL | INTRAVENOUS | Status: DC | PRN

## 2019-10-26 MED ORDER — FREE WATER
250.0000 mL | Status: DC
  Administered 2019-10-26 – 2019-10-29 (×18): 250 mL

## 2019-10-26 MED ORDER — STERILE WATER FOR INJECTION IJ SOLN
INTRAMUSCULAR | Status: AC
  Administered 2019-10-26: 16:00:00
  Filled 2019-10-26: qty 10

## 2019-10-26 MED ORDER — HEPARIN (PORCINE) 25000 UT/250ML-% IV SOLN
1300.0000 [IU]/h | INTRAVENOUS | Status: DC
  Administered 2019-10-26: 1300 [IU]/h via INTRAVENOUS
  Filled 2019-10-26: qty 250

## 2019-10-26 MED ORDER — SODIUM CHLORIDE 0.9% FLUSH
10.0000 mL | Freq: Two times a day (BID) | INTRAVENOUS | Status: DC
  Administered 2019-10-26 – 2019-10-28 (×6): 10 mL

## 2019-10-26 MED ORDER — INSULIN ASPART 100 UNIT/ML ~~LOC~~ SOLN
4.0000 [IU] | SUBCUTANEOUS | Status: DC
  Administered 2019-10-26 – 2019-10-28 (×13): 4 [IU] via SUBCUTANEOUS

## 2019-10-26 MED ORDER — OXYCODONE HCL 5 MG/5ML PO SOLN
10.0000 mg | Freq: Four times a day (QID) | ORAL | Status: DC
  Administered 2019-10-26 – 2019-10-29 (×11): 10 mg
  Filled 2019-10-26 (×11): qty 10

## 2019-10-26 MED ORDER — HEPARIN (PORCINE) 25000 UT/250ML-% IV SOLN
1000.0000 [IU]/h | INTRAVENOUS | Status: AC
  Administered 2019-10-26: 1000 [IU]/h via INTRAVENOUS

## 2019-10-26 MED ORDER — CLONAZEPAM 1 MG PO TABS
2.0000 mg | ORAL_TABLET | Freq: Two times a day (BID) | ORAL | Status: DC
  Administered 2019-10-26 – 2019-10-28 (×4): 2 mg
  Filled 2019-10-26 (×4): qty 2

## 2019-10-26 MED ORDER — PHENYLEPHRINE HCL-NACL 10-0.9 MG/250ML-% IV SOLN
0.0000 ug/min | INTRAVENOUS | Status: DC
  Administered 2019-10-26: 20 ug/min via INTRAVENOUS
  Filled 2019-10-26 (×2): qty 250

## 2019-10-26 MED ORDER — APIXABAN 5 MG PO TABS: 5.0000 mg | ORAL_TABLET | Freq: Two times a day (BID) | ORAL | Status: DC

## 2019-10-26 MED ORDER — INSULIN DETEMIR 100 UNIT/ML ~~LOC~~ SOLN
15.0000 [IU] | Freq: Two times a day (BID) | SUBCUTANEOUS | Status: DC
  Administered 2019-10-26 – 2019-10-28 (×4): 15 [IU] via SUBCUTANEOUS
  Filled 2019-10-26 (×5): qty 0.15

## 2019-10-26 MED ORDER — APIXABAN 5 MG PO TABS
10.0000 mg | ORAL_TABLET | Freq: Two times a day (BID) | ORAL | Status: DC
  Administered 2019-10-26 – 2019-10-28 (×4): 10 mg
  Filled 2019-10-26 (×5): qty 2

## 2019-10-26 NOTE — Progress Notes (Signed)
[facility]  Persistent hypoxemia, need for paralytic again Increase clonazepam and oxycodone.  [name], MD [facility] Pager: [phone] Cell: [phone] If no response, call [phone]

## 2019-10-26 NOTE — Progress Notes (Signed)
Changes made based on ABG result.  Another ABG will follow.

## 2019-10-26 NOTE — Progress Notes (Addendum)
ANTICOAGULATION CONSULT NOTE  Pharmacy Consult for Heparin Indication: VTE treatment   Patient Measurements: Height: 5\' 7"  (170.2 cm) Weight: 197 lb 15.6 oz (89.8 kg) IBW/kg (Calculated) : 66.1  Heparin dosing weight: 84 kg  Vital Signs: Temp: 102.9 F (39.4 C) (12/08 2300) Temp Source: Oral (12/08 2300) BP: 94/52 (12/08 2245) Pulse Rate: 83 (12/08 2300)  Labs: Recent Labs    10/23/19 0636  10/24/19 0429 10/24/19 0819  10/24/19 2145 10/25/19 0630 10/25/19 2120  HGB 11.4*   < > 10.5* 9.9*  --   --  9.2*  --   HCT 39.0   < > 35.9* 29.0*  --   --  31.4*  --   PLT 197  --  189  --   --   --  158  --   HEPARINUNFRC <0.10*   < > <0.10*  --    < > 1.00* 1.24* 1.46*  CREATININE 2.32*  --  2.21*  --   --   --  2.43*  --    < > = values in this interval not displayed.     Assessment: 58 yoM admitted on 11/16 with COVID-19 pneumonia.  He has been on Lovenox for VTE prophylaxis (BID dosing while in ICU from 11/17-11/21).   11/17: Dopplers negative for DVT 11/22: Dopoplers positive for DVT, CTa negative for PE.  Pharmacy was initially consulted to dose Lovenox for VTE, Lovenox was held on 12/3 for large epistaxis.  Bleeding now resolved after rhinorocket placement on 12/3.  12/6: No further bleeding or epistaxis reported. Rhinorocket removed without complications.  12/7: Heparin level remains low (undetectable), despite rate increases. Level was double-checked yesterday for accuracy. CBC reveals a small drop in hemoglobin today. Night shift RN stated patient had a large blood clot in his nasal discharge this morning. I spoke with the day shift RN, no additional bleeding thus far.  12/7 1600 Heparin level came back therapeutic this PM. We will repeat to confirm tonight. We will continue to monitor the bleed.   10/24/19  PM: 1.00 IU/mL at 2250, above goal range RN reports blood-tinged secretions from lungs and sinus passages Left arm IV site infiltrated with  edema up to left   armpit; subsequently changed  12/8 AM: Heparin infiltrated yesterday, pt now with ecchymosis over abdomen and a red eye. Discussed plan for heparin with RN.  12/9 0030  Heparin level: 1.46 IU/mL despite earlier rate decrease Hb has dropped from 10.5 on 12/7 to 8.5 RN reports no issues with infusion site and no overt bleeding Discussed holding heparin for one hour with RN  Goal of Therapy:  Heparin level 0.3-0.7 Monitor platelets by anticoagulation protocol: Yes   Plan:  HOLD heparin infusion until 0200  Re-start  heparin at 1300 units/hr Re-check heparin level in ~6 hours CMP ordered for am on 12/9 Daily heparin level (when stable) and CBC Monitor for epistaxis, abdominal, or  other bleeding   Despina Pole, Pharm. D. Clinical Pharmacist 10/26/2019 12:39 AM

## 2019-10-26 NOTE — Progress Notes (Signed)
Since 1900 tonight Peak Pressures on Vent ranged from 50 to 60s, sats 88 to 90 initially with drops to 85% , Temp 39.4C Oral.  Pt has been iced, decreased light in room.  RT suctioned small part of old clot from ETT but unable to get it all. Pt did better for about 15 minutes then did odd breathing on vent.Asculated for breathing which did not, but continue to be diminshed and tight sounding.  Poor movement noted on chest rise with Vent breathing; noted abdomen rising with vent better than chest movement.  RT returned to bedside and did a recruitment then repositioned to Rt side with some improvement fo sats, back up to 87 to 89%.  Vec dose also administered at 2055 to assist after pt had been repositioned.  2245 Noted sats back down to 82% to 84%.  Orally suctioned then repositioned to supine with inially increase to 85% then 2 minutes later back to 81 and as low as 77% sats. Attempted to suction in ETT but no results returned from suctioning.  RT called back to room to assessment.    2250 Vec dose given and ELink called re: continue low sats raning from 77 to 82% while on vent.  Talked with Dr Sharlene Motts re: pt's status and all events leading to call.  At this time, Dr Sharlene Motts did state he would call the pt's spouse and udpate her.  Pt remains a full code at this time.  Awaiting for any new orders or udpates from Dr Kathyrn Sheriff Provider.

## 2019-10-26 NOTE — Progress Notes (Signed)
Spoke to patients wife Sybil over the phone- Dr. Lake Bells also in the room and able to speak to wife again. All questions answered and wife fully updated.

## 2019-10-26 NOTE — Progress Notes (Signed)
NAME:  William [name], MRN:  [id number], DOB:  June 21, 1956, LOS: 21 ADMISSION DATE:  09/18/2019, CONSULTATION DATE:  12/3 REFERRING MD:  Cathlean Sauer, CHIEF COMPLAINT:  Dyspnea, epistaxis   Brief History   63 y/o male admitted on 11/16 with severe acute respiratory failure with hypoxemia due to COVID 19 pneumonia. He was treated for three weeks with standard therapy, but did not have improvement in oxygenation.  On 12/3 he developed epistaxis and worsening hypoxemia and was moved to the ICU.  Past Medical History  Hypertension CAD DM2 NSTEMI Sickle cell trait  Significant Hospital Events   11/16 admit, treated with heated high flow 11/19 move from PCU 12/03 back to ICU epistaxis, intubated 12/05 cuff blown on ETT >> replaced ETT; resumed anticoagulation 12/06 removed nasal packing, PaO2:FiO2 80.  FiO2 100%, PEEP 14.  No further epistaxis. 12/07 100% FiO2, PEEP 14, Peak 44, Plat 28, PCV ventilation, supine 0600 12/09 tx to PO anticoagulation   Consults:    Procedures:  12/3 ETT >>  Significant Diagnostic Tests:  CTA chest 11/22  >> neg for PE, extensive pneumonia findings BLE Venous Duplex 11/22 >> positive for BLE DVT  Micro Data:  SARS COV 2 11/15 >> positive BCx2 11/16 >> negative  Antimicrobials/COVID Rx  Remdesivir 11/16 > 11/19 Ceftriaxone/azithro x1 11/16 Tocilizumab 11/17  Interim history/subjective:  Scant bloody secretions from ETT No further nose bleed  On 20 neo Heparin gtt 3 versed 175 fent I/O - 1.1L UOP in last 24h, +1.5L in 24  Objective   Blood pressure (!) 88/49, pulse 64, temperature 98.6 F (37 C), temperature source Oral, resp. rate (!) 32, height 5\' 7"  (1.702 m), weight 88.9 kg, SpO2 97 %.    Vent Mode: PRVC FiO2 (%):  [90 %-100 %] 100 % Set Rate:  [32 bmp] 32 bmp Vt Set:  [500 mL] 500 mL PEEP:  [16 cmH20] 16 cmH20 Plateau Pressure:  [32 cmH20-38 cmH20] 38 cmH20   Intake/Output Summary (Last 24 hours) at 10/26/2019 0945 Last data filed  at 10/26/2019 0800 Gross per 24 hour  Intake 3749.03 ml  Output 2455 ml  Net 1294.03 ml   Filed Weights   10/24/19 0800 10/25/19 0436 10/26/19 0451  Weight: 85.3 kg 89.8 kg 88.9 kg    Examination: General: critically ill appearing adult male lying in bed in NAD  HEENT: MM pink/moist, ETT, right eye conjunctival bleed, scant bloody secretions from ETT Neuro: sedate, coughs with stimulation  CV: s1s2 RRR, no m/r/g PULM:  Non-labored on vent, lungs bilaterally with rhonchi,  Peak 46, plat 39, FiO2 100, PEEP 16 GI: soft, bsx4 active  Extremities: warm/dry, no edema  Skin: no rashes or lesions  CXR 12/9 >> images personally reviewed, ETT in good position, bilateral patchy airspace opacities   Resolved Hospital Problem list     Assessment & Plan:   Acute hypoxic, hypercapnic respiratory failure from COVID 19 pneumonia complicated by epistaxis and concern for aspiration Pneumomediastinum  Required nasal packing, removed 12/6.  -continue flonase, saline spray  -low Vt ventilation 4-8cc/kg -goal plateau pressure <30, driving pressure [id number] cm H2O -target PaO2 55-65, titrate PEEP/FiO2 per ARDS protocol  -if P/F ratio <150, consider prone therapy for 16 hours per day > prone 12/9 once PICC line placed -goal CVP <4, diuresis as necessary -VAP prevention measures  -follow intermittent CXR   Acute metabolic encephalopathy Need for Sedation secondary to Mechanical Ventilation -RASS goal -4 to -5 with PRN NMB -continue klonopin, oxycodone    AKI  on CKD 3 Hyperkalemia Hypernatremia -increase free water to 232ml Q4   -continue D5w at 27ml/hr  -Trend BMP / urinary output -Replace electrolytes as indicated -Avoid nephrotoxic agents, ensure adequate renal perfusion  Anemia  Suspect of critical illness.  Note drift from 11-12 on admit to ~8 -trend CBC -transfuse for Hgb <7% or active bleeding  Hx of CAD s/p stent 2015, HTN, HLD -continue coreg, lipitor -hold home norvasc, HCTZ,  valsartan   Acute B/L lower leg DVT from 10/09/19 -heparin gtt per pharmacy with transition to eliquis   DM II -SSI + Levemir  Severe Protein Calorie Malnutrition.  -TF per nutrition   Best practice:  Diet: TF DVT prophylaxis: Full dose anticoagulation GI prophylaxis: famotidine Mobility: bed rest Code Status: full code Disposition: ICU Family: Wife (Sybil) updated via phone 12/8 per NP.  Requests Dr. Lake Bells to call her 12/9.   Labs    CMP Latest Ref Rng & Units 10/26/2019 10/26/2019 10/25/2019  Glucose 70 - 99 mg/dL - - 238(H)  BUN 8 - 23 mg/dL - - 136(H)  Creatinine 0.61 - 1.24 mg/dL - - 2.43(H)  Sodium 135 - 145 mmol/L 151(H) 150(H) 156(H)  Potassium 3.5 - 5.1 mmol/L 5.0 5.2(H) 5.4(H)  Chloride 98 - 111 mmol/L - - 115(H)  CO2 22 - 32 mmol/L - - 33(H)  Calcium 8.9 - 10.3 mg/dL - - 9.2  Total Protein 6.5 - 8.1 g/dL - - -  Total Bilirubin 0.3 - 1.2 mg/dL - - -  Alkaline Phos 38 - 126 U/L - - -  AST 15 - 41 U/L - - -  ALT 0 - 44 U/L - - -    CBC Latest Ref Rng & Units 10/26/2019 10/26/2019 10/26/2019  WBC 4.0 - 10.5 K/uL 8.9 - -  Hemoglobin 13.0 - 17.0 g/dL 8.4(L) 8.5(L) 8.2(L)  Hematocrit 39.0 - 52.0 % 29.1(L) 25.0(L) 24.0(L)  Platelets 150 - 400 K/uL 148(L) - -    ABG    Component Value Date/Time   PHART 7.364 10/26/2019 0539   PCO2ART 56.1 (H) 10/26/2019 0539   PO2ART 62.0 (L) 10/26/2019 0539   HCO3 32.0 (H) 10/26/2019 0539   TCO2 34 (H) 10/26/2019 0539   O2SAT 90.0 10/26/2019 0539    CBG (last 3)  Recent Labs    10/25/19 2049 10/25/19 2340 10/26/19 0758  GLUCAP 204* 235* 269*    CC Time: 30 minutes  Noe Gens, MSN, NP-C [facility] 10/26/2019, 9:45 AM   Please see [email or web address] for pager details.

## 2019-10-26 NOTE — Progress Notes (Signed)
Called Dr Su Grand provider back to validate STAT CXR order discussed with previous call. Dr Lucile Shutters stated that had one ealier today thus will not order this at this time.  Have RT/CRNA evaluate ETT for air leak.  Nurse noted sound indicative as well keeping pressure on syringe attached to ETT cuff.  With pressure held, sats up to 84% and no sound; Peak Pressurs 52,. RR 32.    RT paged and will be at bedside momentarily.

## 2019-10-26 NOTE — Progress Notes (Signed)
   10/26/19 0900  Clinical Encounter Type  Visited With Family;Other (Comment) (Wife- William [name])  Visit Type Initial;Psychological support;Spiritual support  Referral From Nurse  Consult/Referral To Chaplain  Spiritual Encounters  Spiritual Needs Other (Comment) (Advance Directive Education )  Stress Factors  Patient Stress Factors Not reviewed  Family Stress Factors Other (Comment);Financial concerns  Regulatory affairs officer (For Healthcare)  Does Patient Have a Medical Advance Directive? No   I spoke with William [name] wife, William [name], per spiritual care consult to discuss an Advance Directive.  William [name] told me that she had actually wanted to talk about a durable, or financial, power of attorney. I informed her that Spiritual Care isn't able to help her with this document, and that we were only able to help with healthcare documents. She was very understanding, and stated that she feels that her husband is being taken care of by our hospital and is hopeful that he will be able to come home soon.   Please, contact Spiritual Care for further assistance.   Chaplain Shanon Ace M.Div., [facility]

## 2019-10-26 NOTE — Progress Notes (Signed)
Settings changed at per peak pressures of 60 and greater and due to pt not being able to sat about 82. RTx2 bag lavaged pt, performed recruitment maneuver and pt sats would not come up. RT's placed pt on right side, suctioned again and changed to PCV and pt sats began to improve. RT to continue to monitor as needed.

## 2019-10-26 NOTE — Progress Notes (Deleted)
[place] for Atropine Once Order.  Last Atropine given at 0300 for Bradycardia.  Next dose availabe per order is 0900 today.  Talked with Kathlee Nations, RN, regarding issue.  Kathlee Nations stated will discuss case with Dr Sharlene Motts.  Awaiting orders or further instructions from [facility] provider.   0655 - Awaiting any new orders from [facility] Provider, continue to monitor low HR.  Changed Lead viewing from Lead I to II then III.  Baseline showed multiple waves with QRS still at 36 to 40 bpm.  Pt continues to have pulse and cycling BP.  Neo gtt restarted at 41mcg/min d/t MAP boderline.  Returned Cardiac Monitor back to Lead I but continued to see unsteady baseline.  D/T poor EKG waveform, decided to unprone pat. Awaiting for RT and other nurse to assist with this.  0703 Pt unproned with 2 RT and 3 nurses.  Placed pads on pt as precautionary.  Pt eyes open and looking at [place] even though on Versed at 3.5 mg/hr and Fentanyl at 200 mcg/hr.  After pad placed and EKG patched attached on front, pt calm and eyes closed.  Pulse steady and strong even with low HR, BPs 120-130/80.  Neo turned off.  Provider/Hospitalist at bedside.  Updated given by nurse to provider then to Day shift Nurse, Thurmond Butts.  New orders received.    0710 12Lead EKG obtained and Dopamine gtt started at 1.39mcg/kg/min as per ordered and Mg2+  2gms administered.  Blood work being drawn as ordered.

## 2019-10-26 NOTE — Progress Notes (Addendum)
[facility] PCCM  I updated his wife today at length. I let her know that this process will take months to recover.  She is willing to continue full medical support.  She describes William [name] as a steadfast, patient person who she believes will be able to withstand the difficult weeks/months ahead.    [name], MD [place] PCCM Pager: [phone] Cell: [phone] If no response, call [phone]   Addendum:   [name] tells requests that we consider playing the following songs for [name] to help keep him calm:  Breezin' by Payton Spark Ribbon in the [facility] version, not the [employer] version) Any [employer], particularly Remember the [employer], MD [place] PCCM Pager: [phone] Cell: [phone] If no response, call [phone]

## 2019-10-26 NOTE — Progress Notes (Signed)
ANTICOAGULATION CONSULT NOTE  Pharmacy Consult for Heparin Indication: VTE treatment   Patient Measurements: Height: 5\' 7"  (170.2 cm) Weight: 195 lb 15.8 oz (88.9 kg) IBW/kg (Calculated) : 66.1  Heparin dosing weight: 84 kg  Vital Signs: Temp: 99.2 F (37.3 C) (12/09 1200) Temp Source: Oral (12/09 1200) BP: 167/61 (12/09 1300) Pulse Rate: 79 (12/09 1300)  Labs: Recent Labs    10/24/19 0429  10/25/19 0630 10/25/19 2120 10/26/19 0318 10/26/19 0539 10/26/19 0800  HGB 10.5*   < > 9.2*  --  8.2* 8.5* 8.4*  HCT 35.9*   < > 31.4*  --  24.0* 25.0* 29.1*  PLT 189  --  158  --   --   --  148*  HEPARINUNFRC <0.10*   < > 1.24* 1.46*  --   --  1.06*  CREATININE 2.21*  --  2.43*  --   --   --  2.30*   < > = values in this interval not displayed.     Assessment: 42 yoM admitted on 11/16 with COVID-19 pneumonia.  He has been on Lovenox for VTE prophylaxis (BID dosing while in ICU from 11/17-11/21).   11/17: Dopplers negative for DVT 11/22: Dopoplers positive for DVT, CTa negative for PE.  Pharmacy was initially consulted to dose Lovenox for VTE, Lovenox was held on 12/3 for large epistaxis.  Bleeding now resolved after rhinorocket placement on 12/3.  12/6: No further bleeding or epistaxis reported. Rhinorocket removed without complications.  12/7: Heparin level remains low (undetectable), despite rate increases. Level was double-checked yesterday for accuracy. CBC reveals a small drop in hemoglobin today. Night shift RN stated patient had a large blood clot in his nasal discharge this morning. I spoke with the day shift RN, no additional bleeding thus far.  12/7 1600 Heparin level came back therapeutic this PM. We will repeat to confirm tonight. We will continue to monitor the bleed.   10/24/19  PM: 1.00 IU/mL at 2250, above goal range RN reports blood-tinged secretions from lungs and sinus passages Left arm IV site infiltrated with  edema up to left  armpit; subsequently  changed  12/8 AM: Heparin infiltrated yesterday, pt now with ecchymosis over abdomen and a red eye. Discussed plan for heparin with RN.  12/9: Heparin level is elevated, ready to be transitioned to Eliquis   Goal of Therapy:  Heparin level 0.3-0.7 Monitor platelets by anticoagulation protocol: Yes   Plan:  HOLD heparin infusion, resume heparin at 1300 at 1000 units/hr  Daily heparin level and CBC Will stop heparin at 1800 tonight and begin Eliquis Eliquis 10 mg po bid x 9 doses then 5 mg po bid *Shortened loading dose since patient was on heparin for a few days prior   Harvel Quale 10/26/2019 2:23 PM

## 2019-10-26 NOTE — Procedures (Signed)
Cortrak  Person Inserting Tube:  Maylon Peppers C, RD Tube Type:  Cortrak - 43 inches Tube Location:  Left nare Initial Placement:  Postpyloric Secured by: Bridle Technique Used to Measure Tube Placement:  Documented cm marking at nare/ corner of mouth Cortrak Secured At:  89 cm    Cortrak Tube Team Note:  Consult received to place a Cortrak feeding tube.   No x-ray is required. RN may begin using tube.   If the tube becomes dislodged please keep the tube and contact the Cortrak team at [email or web address] ([email or web address]) for replacement.  If after hours and replacement cannot be delayed, place a NG tube and confirm placement with an abdominal x-ray.    [place], [place], [place] Pager [phone] After Hours Pager

## 2019-10-26 NOTE — Progress Notes (Signed)
Peripherally Inserted Central Catheter/Midline Placement  The IV Nurse has discussed with the patient and/or persons authorized to consent for the patient, the purpose of this procedure and the potential benefits and risks involved with this procedure.  The benefits include less needle sticks, lab draws from the catheter, and the patient may be discharged home with the catheter. Risks include, but not limited to, infection, bleeding, blood clot (thrombus formation), and puncture of an artery; nerve damage and irregular heartbeat and possibility to perform a PICC exchange if needed/ordered by physician.  Alternatives to this procedure were also discussed.  Bard Power PICC patient education guide, fact sheet on infection prevention and patient information card has been provided to patient /or left at bedside.    PICC/Midline Placement Documentation  PICC Triple Lumen 0000000 PICC Right Basilic 45 cm 1 cm (Active)  Indication for Insertion or Continuance of Line Prolonged intravenous therapies 10/26/19 1401  Exposed Catheter (cm) 0 cm 10/26/19 1401  Site Assessment Intact;Dry;Clean 10/26/19 1401  Lumen #1 Status Flushed;Blood return noted 10/26/19 1401  Lumen #2 Status Flushed;Blood return noted 10/26/19 1401  Lumen #3 Status Flushed;Blood return noted 10/26/19 1401  Dressing Type Transparent 10/26/19 1401  Dressing Status Clean;Dry;Intact;Antimicrobial disc in place;Other (Comment) 10/26/19 1401  Dressing Intervention New dressing 10/26/19 1401  Dressing Change Due 11/02/19 10/26/19 1401   Telephone consent signed by wife    William [name] 10/26/2019, 2:02 PM

## 2019-10-26 NOTE — Progress Notes (Signed)
RT lavaged pt at this time, pt had blood clot come up in tube about the size of a penny. RT unable to suction out whole clot at this time. Will continue to monitor, RN aware.

## 2019-10-26 NOTE — Progress Notes (Signed)
[facility] Progress Note Patient Name: William [name] DOB: 1956-06-02 MRN: [id number]   Date of Service  10/26/2019  HPI/Events of Note  Pt with soft blood pressure, he had COVID-19 pneumonia and his lungs are being run dry.  eICU Interventions  Phenylephrine infusion PRN MAP's < 65 mmHg        Manpreet Kemmer U Dyson Sevey 10/26/2019, 4:59 AM

## 2019-10-26 NOTE — Progress Notes (Signed)
Inpatient Diabetes Program Recommendations  AACE/ADA: New Consensus Statement on Inpatient Glycemic Control (2015)  Target Ranges:  Prepandial:   less than 140 mg/dL      Peak postprandial:   less than 180 mg/dL (1-2 hours)      Critically ill patients:  140 - 180 mg/dL   Lab Results  Component Value Date   GLUCAP 269 (H) 10/26/2019   HGBA1C 8.1 (H) 09/30/2019    Review of Glycemic Control Results for BAILOR, DARGIN (MRN [id number]) as of 10/26/2019 14:57  Ref. Range 10/25/2019 15:29 10/25/2019 20:49 10/25/2019 23:40 10/26/2019 04:24 10/26/2019 07:58  Glucose-Capillary Latest Ref Range: 70 - 99 mg/dL 243 (H) 204 (H) 235 (H) 273 (H) 269 (H)   Diabetes history: DM 2 Outpatient Diabetes medications: Jardiance 25 mg daily, lingliptin-metformin 2.5-100 daily Current orders for Inpatient glycemic control:  Levemir 30 units daily Vital 40 ml/hr Novolog 2 units q 4 hours Novolog moderate q 4 hours  Inpatient Diabetes Program Recommendations:    Please consider changing Levemir to 16 units bid.  Also please increase Novolog tube feed coverage to 4 units q 4 hours.   Thanks  Adah Perl, RN, BC-ADM Inpatient Diabetes Coordinator Pager [phone] (8a-5p)

## 2019-10-27 ENCOUNTER — Inpatient Hospital Stay (HOSPITAL_COMMUNITY): Payer: Commercial Managed Care - PPO

## 2019-10-27 DIAGNOSIS — N171 Acute kidney failure with acute cortical necrosis: Secondary | ICD-10-CM

## 2019-10-27 LAB — GLUCOSE, CAPILLARY
Glucose-Capillary: 168 mg/dL — ABNORMAL HIGH (ref 70–99)
Glucose-Capillary: 136 mg/dL — ABNORMAL HIGH (ref 70–99)
Glucose-Capillary: 255 mg/dL — ABNORMAL HIGH (ref 70–99)
Glucose-Capillary: 292 mg/dL — ABNORMAL HIGH (ref 70–99)
Glucose-Capillary: 281 mg/dL — ABNORMAL HIGH (ref 70–99)

## 2019-10-27 LAB — BASIC METABOLIC PANEL
Anion gap: 4 — ABNORMAL LOW (ref 5–15)
Anion gap: 6 (ref 5–15)
Anion gap: 6 (ref 5–15)
BUN: 133 mg/dL — ABNORMAL HIGH (ref 8–23)
BUN: 123 mg/dL — ABNORMAL HIGH (ref 8–23)
BUN: 131 mg/dL — ABNORMAL HIGH (ref 8–23)
CO2: 34 mmol/L — ABNORMAL HIGH (ref 22–32)
CO2: 34 mmol/L — ABNORMAL HIGH (ref 22–32)
CO2: 31 mmol/L (ref 22–32)
Calcium: 9 mg/dL (ref 8.9–10.3)
Calcium: 9 mg/dL (ref 8.9–10.3)
Calcium: 8.4 mg/dL — ABNORMAL LOW (ref 8.9–10.3)
Chloride: 115 mmol/L — ABNORMAL HIGH (ref 98–111)
Chloride: 113 mmol/L — ABNORMAL HIGH (ref 98–111)
Chloride: 108 mmol/L (ref 98–111)
Creatinine, Ser: 2.4 mg/dL — ABNORMAL HIGH (ref 0.61–1.24)
Creatinine, Ser: 2.72 mg/dL — ABNORMAL HIGH (ref 0.61–1.24)
Creatinine, Ser: 2.43 mg/dL — ABNORMAL HIGH (ref 0.61–1.24)
GFR calc Af Amer: 32 mL/min — ABNORMAL LOW (ref >60)
GFR calc Af Amer: 28 mL/min — ABNORMAL LOW (ref >60)
GFR calc Af Amer: 32 mL/min — ABNORMAL LOW (ref >60)
GFR calc non Af Amer: 28 mL/min — ABNORMAL LOW (ref >60)
GFR calc non Af Amer: 24 mL/min — ABNORMAL LOW (ref >60)
GFR calc non Af Amer: 27 mL/min — ABNORMAL LOW (ref >60)
Glucose, Bld: 235 mg/dL — ABNORMAL HIGH (ref 70–99)
Glucose, Bld: 180 mg/dL — ABNORMAL HIGH (ref 70–99)
Glucose, Bld: 444 mg/dL — ABNORMAL HIGH (ref 70–99)
Potassium: 6 mmol/L — ABNORMAL HIGH (ref 3.5–5.1)
Potassium: 6.6 mmol/L (ref 3.5–5.1)
Potassium: 5.2 mmol/L — ABNORMAL HIGH (ref 3.5–5.1)
Sodium: 153 mmol/L — ABNORMAL HIGH (ref 135–145)
Sodium: 153 mmol/L — ABNORMAL HIGH (ref 135–145)
Sodium: 145 mmol/L (ref 135–145)

## 2019-10-27 LAB — URINE CULTURE: Culture: NO GROWTH

## 2019-10-27 LAB — POCT I-STAT 7, (LYTES, BLD GAS, ICA,H+H)
Acid-Base Excess: 7 mmol/L — ABNORMAL HIGH (ref 0.0–2.0)
Bicarbonate: 33 mmol/L — ABNORMAL HIGH (ref 20.0–28.0)
Calcium, Ion: 1.3 mmol/L (ref 1.15–1.40)
HCT: 22 % — ABNORMAL LOW (ref 39.0–52.0)
Hemoglobin: 7.5 g/dL — ABNORMAL LOW (ref 13.0–17.0)
O2 Saturation: 79 %
Patient temperature: 36.5
Potassium: 5.8 mmol/L — ABNORMAL HIGH (ref 3.5–5.1)
Sodium: 149 mmol/L — ABNORMAL HIGH (ref 135–145)
TCO2: 35 mmol/L — ABNORMAL HIGH (ref 22–32)
pCO2 arterial: 57.8 mmHg — ABNORMAL HIGH (ref 32.0–48.0)
pH, Arterial: 7.362 (ref 7.350–7.450)
pO2, Arterial: 45 mmHg — ABNORMAL LOW (ref 83.0–108.0)

## 2019-10-27 LAB — CBC
HCT: 30 % — ABNORMAL LOW (ref 39.0–52.0)
Hemoglobin: 8.2 g/dL — ABNORMAL LOW (ref 13.0–17.0)
MCH: 25 pg — ABNORMAL LOW (ref 26.0–34.0)
MCHC: 27.3 g/dL — ABNORMAL LOW (ref 30.0–36.0)
MCV: 91.5 fL (ref 80.0–100.0)
Platelets: 157 10*3/uL (ref 150–400)
RBC: 3.28 MIL/uL — ABNORMAL LOW (ref 4.22–5.81)
RDW: 16.9 % — ABNORMAL HIGH (ref 11.5–15.5)
WBC: 8.9 10*3/uL (ref 4.0–10.5)
nRBC: 0.6 % — ABNORMAL HIGH (ref 0.0–0.2)

## 2019-10-27 LAB — MAGNESIUM: Magnesium: 4 mg/dL — ABNORMAL HIGH (ref 1.7–2.4)

## 2019-10-27 MED ORDER — CALCIUM GLUCONATE-NACL 1-0.675 GM/50ML-% IV SOLN
1.0000 g | Freq: Once | INTRAVENOUS | Status: AC
  Administered 2019-10-27: 1000 mg via INTRAVENOUS
  Filled 2019-10-27: qty 50

## 2019-10-27 MED ORDER — SODIUM ZIRCONIUM CYCLOSILICATE 10 G PO PACK
10.0000 g | PACK | Freq: Every day | ORAL | Status: DC
  Filled 2019-10-27: qty 1

## 2019-10-27 MED ORDER — INSULIN ASPART 100 UNIT/ML IV SOLN
10.0000 [IU] | Freq: Once | INTRAVENOUS | Status: AC
  Administered 2019-10-27: 10 [IU] via INTRAVENOUS

## 2019-10-27 MED ORDER — JUVEN PO PACK
1.0000 | PACK | Freq: Two times a day (BID) | ORAL | Status: DC
  Administered 2019-10-28 (×2): 1
  Filled 2019-10-27 (×2): qty 1

## 2019-10-27 MED ORDER — DEXTROSE 50 % IV SOLN
1.0000 | Freq: Once | INTRAVENOUS | Status: AC
  Administered 2019-10-27: 50 mL via INTRAVENOUS
  Filled 2019-10-27: qty 50

## 2019-10-27 MED ORDER — STERILE WATER FOR INJECTION IJ SOLN
INTRAMUSCULAR | Status: AC
  Administered 2019-10-27: 10 mL
  Filled 2019-10-27: qty 10

## 2019-10-27 MED ORDER — SODIUM BICARBONATE 8.4 % IV SOLN
50.0000 meq | Freq: Once | INTRAVENOUS | Status: AC
  Administered 2019-10-27: 50 meq via INTRAVENOUS

## 2019-10-27 MED ORDER — SODIUM POLYSTYRENE SULFONATE 15 GM/60ML PO SUSP
30.0000 g | Freq: Once | ORAL | Status: AC
  Administered 2019-10-27: 30 g
  Filled 2019-10-27: qty 120

## 2019-10-27 NOTE — Progress Notes (Signed)
Patient placed in prone position by RT x 2 and RN x 5 without complications.  ETT secured with cloth tape.

## 2019-10-27 NOTE — Progress Notes (Signed)
Intubation Procedure Note Robt Okuda [id number] [birth year]  Procedure: Intubation Indications: ETT needed to be changed  Procedure Details Consent: Unable to obtain consent because of emergent medical necessity. Time Out: Verified patient identification, verified procedure, site/side was marked, verified correct patient position, special equipment/implants available, medications/allergies/relevent history reviewed, required imaging and test results available.  Performed  Drugs dilaudid, versed infusion vecuronium bolus DL x 1 with MAC 4 blade Grade 1 view 7.5 ETT tube was noted to be above the cords, we deflated the cuff and then advanced the tube through cords under direct visualization Placement confirmed with bilateral breath sounds, positive EtCO2 change and smoke in tube   Evaluation Hemodynamic Status: BP stable throughout; O2 sats: transiently fell during during procedure Patient's Current Condition: stable Complications: No apparent complications Patient did tolerate procedure well. Chest X-ray ordered to verify placement.  CXR: pending.   Simonne Maffucci 10/27/2019

## 2019-10-27 NOTE — Progress Notes (Signed)
Head turned at this time. No complications noted

## 2019-10-27 NOTE — Progress Notes (Addendum)
Nutrition Follow-up  DOCUMENTATION CODES:   Severe malnutrition in context of acute illness/injury  INTERVENTION:  Continue:  Vital 1.5 @ 40 ml/hr via Cortrak tube 60 mL prostat QID  Initiate: 24 grams Juven BID   Provides 2390 kcal, 185 grams of protein, and 733 ml free water  With 250 mL free water flush every 4 hours, pt is receiving total of 2233 mL free water/day.   NUTRITION DIAGNOSIS:   Severe Malnutrition related to acute illness(COVID-19) as evidenced by energy intake < or equal to 50% for > or equal to 5 days, percent weight loss(3% weight loss within 1 week); ongoing  GOAL:   Patient will meet greater than or equal to 90% of their needs; met with TF  MONITOR:   PO intake, Supplement acceptance, Labs, Skin  REASON FOR ASSESSMENT:   Consult, Ventilator Enteral/tube feeding initiation and management  ASSESSMENT:   63 yo male admitted with SOB, COVID-19 positive. He was sick 5-6 days before coming to the hospital. PMH includes HTN, DM-2, CAD.   Pt on ARDS protocol and being proned  Patient is currently intubated on ventilator support MV: 13.2  12/5 ETT cuff blown, reintubated  12/7 sats drop to low 70s PEEP increased to 16 Blood tinged sputum Paralytic ordered for vent dyssynchrony  12/9 phenylephrine infusion ordered PRN MAPs <65 Cortrak tube placed- tip is post pyloric Alternative PICC line placed RUE  12/10 overnight pt became hyperkalemic with K+ of 6.6, down to 5.8 (still high) by 1000  Labs reviewed. BUN 123 (H), creatinine 2.72 (H), magnesium 4 (H) K+ 5.8 (H) CBG's: 136-287 MAP: 60 (52-87) UOP: 2.4 L I&O: - 14 L since admit  Medications reviewed and include Novolog SSI every 4 hours, Levemir 15 units twice daily, solu-medrol, MVI, vitamin B-12, vitamin C, zinc sulfate. Norcuron for vent dyssynchrony Fentanyl Versed  Current TF regimen Provides 2390 kcal, 185 grams of protein, and 733 ml free water (2233 ml total with Free water  flushes)  Diet Order:   Diet Order    None      EDUCATION NEEDS:   Education needs have been addressed  Skin:  Skin Assessment: Skin Integrity Issues: Skin Integrity Issues:: Stage II Stage II: coccyx  Last BM:  12/3  Height:   Ht Readings from Last 1 Encounters:  10/24/19 '5\' 7"'$  (1.702 m)    Weight:   Wt Readings from Last 1 Encounters:  10/27/19 89.6 kg    Ideal Body Weight:  67.3 kg  BMI:  Body mass index is 30.94 kg/m.  Estimated Nutritional Needs:   Kcal:  8350-7573  Protein:  130-150 gm  Fluid:  >/= 2.2 L  Meda Klinefelter, Dietetic Intern

## 2019-10-27 NOTE — Progress Notes (Signed)
NAME:  William [name], MRN:  [id number], DOB:  1956-07-21, LOS: 26 ADMISSION DATE:  10/17/2019, CONSULTATION DATE:  12/3 REFERRING MD:  Cathlean Sauer, CHIEF COMPLAINT:  Dyspnea, epistaxis   Brief History   63 y/o male admitted on 11/16 with severe acute respiratory failure with hypoxemia due to COVID 19 pneumonia. He was treated for three weeks with standard therapy, but did not have improvement in oxygenation.  On 12/3 he developed epistaxis and worsening hypoxemia and was moved to the ICU.  Past Medical History  Hypertension CAD DM2 NSTEMI Sickle cell trait  Significant Hospital Events   11/16 admit, treated with heated high flow 11/19 move from PCU 12/03 back to ICU epistaxis, intubated 12/05 cuff blown on ETT >> replaced ETT; resumed anticoagulation 12/06 removed nasal packing, PaO2:FiO2 80.  FiO2 100%, PEEP 14.  No further epistaxis. 12/07 100% FiO2, PEEP 14, Peak 44, Plat 28, PCV ventilation, supine 0600 12/09 tx to PO anticoagulation  12/10 pt reintubated (same tube), cuff above cords with "leak"  Consults:    Procedures:  12/3 ETT >>12/5 (blown cuff), 12/5 >>  RUE PICC 12/9 >>   Significant Diagnostic Tests:  CTA chest 11/22  >> neg for PE, extensive pneumonia findings BLE Venous Duplex 11/22 >> positive for BLE DVT  Micro Data:  SARS COV 2 11/15 >> positive BCx2 11/16 >> negative  Antimicrobials/COVID Rx  Remdesivir 11/16 > 11/19 Ceftriaxone/azithro x1 11/16 Tocilizumab 11/17  Interim history/subjective:  Peak 39, Plat 36 PEEP reduced to 14, FiO2 100% Fever overnight ETT with leak, above cords on exam  Versed, fentanyl, D5W K 6.6 F/u bmp 1300 F/u abg post ETT adjustment   Objective   Blood pressure (!) 141/45, pulse 67, temperature 97.7 F (36.5 C), temperature source Oral, resp. rate (!) 33, height 5\' 7"  (1.702 m), weight 89.6 kg, SpO2 90 %.    Vent Mode: PCV FiO2 (%):  [100 %] 100 % Set Rate:  [32 bmp] 32 bmp Vt Set:  [500 mL] 500 mL PEEP:  [16  cmH20-22 cmH20] 22 cmH20 Plateau Pressure:  [10 cmH20-51 cmH20] 37 cmH20   Intake/Output Summary (Last 24 hours) at 10/27/2019 0836 Last data filed at 10/27/2019 0600 Gross per 24 hour  Intake 3303.5 ml  Output 2195 ml  Net 1108.5 ml   Filed Weights   10/25/19 0436 10/26/19 0451 10/27/19 0325  Weight: 89.8 kg 88.9 kg 89.6 kg    Examination: General: critically ill appearing adult male lying in bed in NAD on vnet  HEENT: MM pink/moist, ETT, poor dentition, right eye with subconjunctival blood Neuro: sedate  CV: s1s2 RRR, no m/r/g PULM:  Non-labored on vent, lungs bilaterally coarse GI: soft, bsx4 active, tol TF  Extremities: warm/dry, no edema  Skin: no rashes or lesions  CXR 12/10 >> images personally reviewed, diffuse bilateral airspace opacities, ETT in good position post re-positioning   Resolved Hospital Problem list     Assessment & Plan:   Acute hypoxic, hypercapnic respiratory failure from COVID 19 pneumonia complicated by epistaxis and concern for aspiration Pneumomediastinum  Peak 39, Plat 36, PEEP 14, FiO2 100% -continue flonase, saline spray for nasal hygiene -low Vt ventilation 4-8cc/kg -goal plateau pressure <30, driving pressure [id number] cm H2O -target PaO2 55-65, titrate PEEP/FiO2 per ARDS protocol  -if P/F ratio <150, prone 12/10 with P/F 45 -goal CVP <4, diuresis as necessary -VAP prevention measures  -follow intermittent CXR   Acute metabolic encephalopathy Need for Sedation secondary to Mechanical Ventilation -RASS Goal -4 to -5  with PRN NMB -klonopin increased to 2mg  BID 12/9 -oxycodone increased to 10mg  Q6 12/9  AKI on CKD 3 Hyperkalemia Hypernatremia -continue free water 240ml Q4 -now dextrose, bicarb, insulin, calcium  -kayexalate PT  -repeat BMP 1300  -Trend BMP / urinary output -Replace electrolytes as indicated -Avoid nephrotoxic agents, ensure adequate renal perfusion  Anemia  Suspect of critical illness.  Note drift from 11-12 on  admit to ~8 -trend CBC -transfuse for Hgb <7% or active bleeding  Hx of CAD s/p stent 2015, HTN, HLD -coreg (at reduced home dose), lipitor -hole home norvasc, valsartan, HCTZ  Acute B/L lower leg DVT from 10/09/19 -eliquis for DVT  DM II -Levemir + SSI  Severe Protein Calorie Malnutrition.  -TF per nutrition    Best practice:  Diet: TF DVT prophylaxis: Full dose anticoagulation GI prophylaxis: famotidine Mobility: bed rest Code Status: full code Disposition: ICU Family: Wife ([place]) updated via phone 12/10  Labs    CMP Latest Ref Rng & Units 10/27/2019 10/27/2019 10/26/2019  Glucose 70 - 99 mg/dL 180(H) 235(H) 299(H)  BUN 8 - 23 mg/dL 123(H) 133(H) 144(H)  Creatinine 0.61 - 1.24 mg/dL 2.72(H) 2.40(H) 2.30(H)  Sodium 135 - 145 mmol/L 153(H) 153(H) 153(H)  Potassium 3.5 - 5.1 mmol/L 6.6(HH) 6.0(H) 5.4(H)  Chloride 98 - 111 mmol/L 113(H) 115(H) 115(H)  CO2 22 - 32 mmol/L 34(H) 34(H) 32  Calcium 8.9 - 10.3 mg/dL 9.0 9.0 8.9  Total Protein 6.5 - 8.1 g/dL - - 6.0(L)  Total Bilirubin 0.3 - 1.2 mg/dL - - 0.4  Alkaline Phos 38 - 126 U/L - - 77  AST 15 - 41 U/L - - 24  ALT 0 - 44 U/L - - 21    CBC Latest Ref Rng & Units 10/27/2019 10/26/2019 10/26/2019  WBC 4.0 - 10.5 K/uL 8.9 8.9 -  Hemoglobin 13.0 - 17.0 g/dL 8.2(L) 8.4(L) 8.5(L)  Hematocrit 39.0 - 52.0 % 30.0(L) 29.1(L) 25.0(L)  Platelets 150 - 400 K/uL 157 148(L) -    ABG    Component Value Date/Time   PHART 7.364 10/26/2019 0539   PCO2ART 56.1 (H) 10/26/2019 0539   PO2ART 62.0 (L) 10/26/2019 0539   HCO3 32.0 (H) 10/26/2019 0539   TCO2 34 (H) 10/26/2019 0539   O2SAT 90.0 10/26/2019 0539    CBG (last 3)  Recent Labs    10/26/19 2338 10/27/19 0355 10/27/19 0756  GLUCAP 287* 168* 136*    CC Time: 30 minutes  Noe Gens, MSN, NP-C [facility] 10/27/2019, 8:36 AM   Please see [email or web address] for pager details.

## 2019-10-27 NOTE — Progress Notes (Signed)
Called [facility]Link to update on pt's status and lab results from [facility] at [facility].  Pt's sats still remain low at this time 83-84%, with occasional cuff leak issue - continue to add air to balloon.  Labs from 0015 showed K= - 6.0.  Since that lab, pt is now normothermic, no ectopy noted on monitor, showing SR with 1AVB.  Pt on right side, with some improvement to sats longer laying on side.  Confirmed to get AM lab to validate where K+ resides currently and not 3 hr old lab.  Talked with Edmonia Lynch, RN with this information.    Plan is to obtain AM labs and validate where K+ level is and continue to monitor cuff leak/attempt to keep sats as hgh as able.

## 2019-10-27 NOTE — Progress Notes (Signed)
Pt's spouse, [name], called for update on pt.  Melene Muller stated had been able to sleep some last night. Nurse updated on pt's night - Issues with sats, ETT/Cuff Leak, and Temperature.  Currently pt is stable; sats back up to 92% form 82% at lowest during night (sustained) and temperature normothermic.  Lying on Rt side, pt has better sats; Sybil stated he liked to always sleep on Rt side.    Sybil stated she was coming in today to get some papers from provider for pt's work.  Nurse asked her if we can give pt's belongings and meds from home to her then.  She said she would pick them up then.  Nurse cautioned her on how to handle these items when taken home - keep wrapped up and separate from other items until later time or can dispose of them as she sees fit.  Sybil voiced her understanding at this time.

## 2019-10-28 ENCOUNTER — Inpatient Hospital Stay (HOSPITAL_COMMUNITY): Payer: Commercial Managed Care - PPO

## 2019-10-28 DIAGNOSIS — N170 Acute kidney failure with tubular necrosis: Secondary | ICD-10-CM

## 2019-10-28 LAB — BASIC METABOLIC PANEL
Anion gap: 8 (ref 5–15)
Anion gap: 11 (ref 5–15)
BUN: 141 mg/dL — ABNORMAL HIGH (ref 8–23)
BUN: 166 mg/dL — ABNORMAL HIGH (ref 8–23)
CO2: 32 mmol/L (ref 22–32)
CO2: 30 mmol/L (ref 22–32)
Calcium: 8.2 mg/dL — ABNORMAL LOW (ref 8.9–10.3)
Calcium: 8.6 mg/dL — ABNORMAL LOW (ref 8.9–10.3)
Chloride: 104 mmol/L (ref 98–111)
Chloride: 104 mmol/L (ref 98–111)
Creatinine, Ser: 2.97 mg/dL — ABNORMAL HIGH (ref 0.61–1.24)
Creatinine, Ser: 3.26 mg/dL — ABNORMAL HIGH (ref 0.61–1.24)
GFR calc Af Amer: 25 mL/min — ABNORMAL LOW (ref >60)
GFR calc Af Amer: 22 mL/min — ABNORMAL LOW (ref >60)
GFR calc non Af Amer: 22 mL/min — ABNORMAL LOW (ref >60)
GFR calc non Af Amer: 19 mL/min — ABNORMAL LOW (ref >60)
Glucose, Bld: 365 mg/dL — ABNORMAL HIGH (ref 70–99)
Glucose, Bld: 299 mg/dL — ABNORMAL HIGH (ref 70–99)
Potassium: 5 mmol/L (ref 3.5–5.1)
Potassium: 5.5 mmol/L — ABNORMAL HIGH (ref 3.5–5.1)
Sodium: 144 mmol/L (ref 135–145)
Sodium: 145 mmol/L (ref 135–145)

## 2019-10-28 LAB — COMPREHENSIVE METABOLIC PANEL
ALT: 257 U/L — ABNORMAL HIGH (ref 0–44)
AST: 497 U/L — ABNORMAL HIGH (ref 15–41)
Albumin: 1.2 g/dL — ABNORMAL LOW (ref 3.5–5.0)
Alkaline Phosphatase: 90 U/L (ref 38–126)
Anion gap: 7 (ref 5–15)
BUN: 162 mg/dL — ABNORMAL HIGH (ref 8–23)
CO2: 34 mmol/L — ABNORMAL HIGH (ref 22–32)
Calcium: 8.6 mg/dL — ABNORMAL LOW (ref 8.9–10.3)
Chloride: 105 mmol/L (ref 98–111)
Creatinine, Ser: 3.03 mg/dL — ABNORMAL HIGH (ref 0.61–1.24)
GFR calc Af Amer: 24 mL/min — ABNORMAL LOW (ref >60)
GFR calc non Af Amer: 21 mL/min — ABNORMAL LOW (ref >60)
Glucose, Bld: 285 mg/dL — ABNORMAL HIGH (ref 70–99)
Potassium: 4.9 mmol/L (ref 3.5–5.1)
Sodium: 146 mmol/L — ABNORMAL HIGH (ref 135–145)
Total Bilirubin: 0.2 mg/dL — ABNORMAL LOW (ref 0.3–1.2)
Total Protein: 6 g/dL — ABNORMAL LOW (ref 6.5–8.1)

## 2019-10-28 LAB — POCT I-STAT 7, (LYTES, BLD GAS, ICA,H+H)
Acid-Base Excess: 5 mmol/L — ABNORMAL HIGH (ref 0.0–2.0)
Bicarbonate: 32.1 mmol/L — ABNORMAL HIGH (ref 20.0–28.0)
Calcium, Ion: 1.24 mmol/L (ref 1.15–1.40)
HCT: 23 % — ABNORMAL LOW (ref 39.0–52.0)
Hemoglobin: 7.8 g/dL — ABNORMAL LOW (ref 13.0–17.0)
O2 Saturation: 69 %
Patient temperature: 99.3
Potassium: 5.2 mmol/L — ABNORMAL HIGH (ref 3.5–5.1)
Sodium: 145 mmol/L (ref 135–145)
TCO2: 34 mmol/L — ABNORMAL HIGH (ref 22–32)
pCO2 arterial: 67 mmHg (ref 32.0–48.0)
pH, Arterial: 7.29 — ABNORMAL LOW (ref 7.350–7.450)
pO2, Arterial: 42 mmHg — ABNORMAL LOW (ref 83.0–108.0)

## 2019-10-28 LAB — URINALYSIS, ROUTINE W REFLEX MICROSCOPIC
Bacteria, UA: NONE SEEN
Bilirubin Urine: NEGATIVE
Glucose, UA: NEGATIVE mg/dL
Ketones, ur: NEGATIVE mg/dL
Leukocytes,Ua: NEGATIVE
Nitrite: NEGATIVE
Protein, ur: NEGATIVE mg/dL
Specific Gravity, Urine: 1.013 (ref 1.005–1.030)
pH: 5 (ref 5.0–8.0)

## 2019-10-28 LAB — GLUCOSE, CAPILLARY
Glucose-Capillary: 224 mg/dL — ABNORMAL HIGH (ref 70–99)
Glucose-Capillary: 234 mg/dL — ABNORMAL HIGH (ref 70–99)
Glucose-Capillary: 251 mg/dL — ABNORMAL HIGH (ref 70–99)
Glucose-Capillary: 259 mg/dL — ABNORMAL HIGH (ref 70–99)
Glucose-Capillary: 263 mg/dL — ABNORMAL HIGH (ref 70–99)
Glucose-Capillary: 310 mg/dL — ABNORMAL HIGH (ref 70–99)

## 2019-10-28 LAB — CBC
HCT: 22.6 % — ABNORMAL LOW (ref 39.0–52.0)
Hemoglobin: 6.4 g/dL — CL (ref 13.0–17.0)
MCH: 25 pg — ABNORMAL LOW (ref 26.0–34.0)
MCHC: 28.3 g/dL — ABNORMAL LOW (ref 30.0–36.0)
MCV: 88.3 fL (ref 80.0–100.0)
Platelets: 146 10*3/uL — ABNORMAL LOW (ref 150–400)
RBC: 2.56 MIL/uL — ABNORMAL LOW (ref 4.22–5.81)
RDW: 16.4 % — ABNORMAL HIGH (ref 11.5–15.5)
WBC: 6 10*3/uL (ref 4.0–10.5)
nRBC: 1.3 % — ABNORMAL HIGH (ref 0.0–0.2)

## 2019-10-28 LAB — MAGNESIUM: Magnesium: 3.6 mg/dL — ABNORMAL HIGH (ref 1.7–2.4)

## 2019-10-28 LAB — HEMOGLOBIN AND HEMATOCRIT, BLOOD
HCT: 23 % — ABNORMAL LOW (ref 39.0–52.0)
Hemoglobin: 6.6 g/dL — CL (ref 13.0–17.0)

## 2019-10-28 LAB — PREPARE RBC (CROSSMATCH)

## 2019-10-28 MED ORDER — ROCURONIUM BROMIDE 10 MG/ML (PF) SYRINGE
100.0000 mg | PREFILLED_SYRINGE | Freq: Once | INTRAVENOUS | Status: AC
  Administered 2019-10-28: 100 mg via INTRAVENOUS
  Filled 2019-10-28: qty 10

## 2019-10-28 MED ORDER — NOREPINEPHRINE 4 MG/250ML-% IV SOLN
0.0000 ug/min | INTRAVENOUS | Status: DC
  Administered 2019-10-28: 2 ug/min via INTRAVENOUS
  Administered 2019-10-29: 4 ug/min via INTRAVENOUS
  Filled 2019-10-28: qty 250

## 2019-10-28 MED ORDER — ROCURONIUM BROMIDE 10 MG/ML (PF) SYRINGE
PREFILLED_SYRINGE | INTRAVENOUS | Status: AC
  Filled 2019-10-28: qty 10

## 2019-10-28 MED ORDER — SODIUM CHLORIDE 0.9 % IV SOLN
1.0000 g | INTRAVENOUS | Status: DC
  Administered 2019-10-28: 1 g via INTRAVENOUS
  Filled 2019-10-28 (×2): qty 1

## 2019-10-28 MED ORDER — CLONAZEPAM 1 MG PO TABS
2.0000 mg | ORAL_TABLET | Freq: Three times a day (TID) | ORAL | Status: DC
  Administered 2019-10-28 (×2): 2 mg
  Filled 2019-10-28 (×3): qty 2

## 2019-10-28 MED ORDER — VANCOMYCIN HCL 10 G IV SOLR: 1500.0000 mg | INTRAVENOUS | Status: DC

## 2019-10-28 MED ORDER — INSULIN DETEMIR 100 UNIT/ML ~~LOC~~ SOLN
20.0000 [IU] | Freq: Two times a day (BID) | SUBCUTANEOUS | Status: DC
  Administered 2019-10-28 – 2019-10-29 (×2): 20 [IU] via SUBCUTANEOUS
  Filled 2019-10-28 (×3): qty 0.2

## 2019-10-28 MED ORDER — VANCOMYCIN HCL 10 G IV SOLR
2000.0000 mg | Freq: Once | INTRAVENOUS | Status: AC
  Administered 2019-10-28: 2000 mg via INTRAVENOUS
  Filled 2019-10-28 (×2): qty 2000

## 2019-10-28 MED ORDER — SODIUM CHLORIDE 0.9% IV SOLUTION: Freq: Once | INTRAVENOUS | Status: DC

## 2019-10-28 MED ORDER — PANTOPRAZOLE SODIUM 40 MG IV SOLR
40.0000 mg | Freq: Two times a day (BID) | INTRAVENOUS | Status: DC
  Administered 2019-10-28: 40 mg via INTRAVENOUS
  Filled 2019-10-28: qty 40

## 2019-10-28 MED ORDER — INSULIN ASPART 100 UNIT/ML ~~LOC~~ SOLN
6.0000 [IU] | SUBCUTANEOUS | Status: DC
  Administered 2019-10-28 – 2019-10-29 (×4): 6 [IU] via SUBCUTANEOUS

## 2019-10-28 MED ORDER — SODIUM POLYSTYRENE SULFONATE 15 GM/60ML PO SUSP
15.0000 g | Freq: Once | ORAL | Status: AC
  Administered 2019-10-28: 15 g
  Filled 2019-10-28: qty 60

## 2019-10-28 MED ORDER — NOREPINEPHRINE 4 MG/250ML-% IV SOLN
INTRAVENOUS | Status: AC
  Filled 2019-10-28: qty 250

## 2019-10-28 NOTE — Progress Notes (Signed)
Several episodes of runs of SVT followed by junctional rhythm as low as 40.  Most recent one came with a decrease in BP down to 0000000 systolic.  Levophed ordered by MD and BMP.  BMP sent to lab and BP now improved on 44mcg Levophed.  Current HR: 66, BP: 102/46 (62)

## 2019-10-28 NOTE — Progress Notes (Signed)
RT NOTE:  Pt ETT holister removed and protective barriers placed with cloth tape securing ETT at 29cm at the lip. Pt proned with RTx2 and RN x 4. Pt tolerated well, RT will continue to monitor

## 2019-10-28 NOTE — Progress Notes (Signed)
LB PCCM  Notified by nursing that in the last 5 minutes his blood pressure dropped to the SBP 70s, associated initially with high HR then temporary bradycardia.   Transfuse 1 U PRBC now Start levophed If BP worsens overnight then consider CT abdom/pelvis to look for RP bleed Change famotidine to PPI bid in case this is a GI bleed  [name], MD [place] PCCM Pager: [phone] Cell: [phone] If no response, call [phone]

## 2019-10-28 NOTE — Progress Notes (Signed)
Results for MOORE, SPIZZIRRI (MRN [id number]) as of 10/28/2019 12:17  Ref. Range 10/27/2019 21:05 10/28/2019 00:28 10/28/2019 04:24 10/28/2019 07:55 10/28/2019 11:24  Glucose-Capillary Latest Ref Range: 70 - 99 mg/dL 281 (H) 310 (H) 224 (H) 234 (H) 251 (H)  Noted that blood sugars continue to be greater than 200 mg/dl.  Recommend increasing Levemir to 20 units BID, continue Novolog MODERATE correction scale every 4 hours, increase Novolog tube feed coverage to 6 units every 4 hours.  Titrate dosages as needed.   Harvel Ricks RN BSN CDE Diabetes Coordinator Pager: [phone]  8am-5pm

## 2019-10-28 NOTE — Progress Notes (Addendum)
NAME:  William [name], MRN:  [id number], DOB:  1956/10/16, LOS: 64 ADMISSION DATE:  09/26/2019, CONSULTATION DATE:  12/3 REFERRING MD:  Cathlean Sauer, CHIEF COMPLAINT:  Dyspnea, epistaxis   Brief History   63 y/o male admitted on 11/16 with severe acute respiratory failure with hypoxemia due to COVID 19 pneumonia. He was treated for three weeks with standard therapy, but did not have improvement in oxygenation.  On 12/3 he developed epistaxis and worsening hypoxemia and was moved to the ICU.  Past Medical History  Hypertension CAD DM2 NSTEMI Sickle cell trait  Significant Hospital Events   11/16 admit, treated with heated high flow 11/19 move from PCU 12/03 back to ICU epistaxis, intubated 12/05 cuff blown on ETT >> replaced ETT; resumed anticoagulation 12/06 removed nasal packing, PaO2:FiO2 80.  FiO2 100%, PEEP 14.  No further epistaxis. 12/07 100% FiO2, PEEP 14, Peak 44, Plat 28, PCV ventilation, supine 0600 12/09 tx to PO anticoagulation  12/10 pt reintubated (same tube), cuff above cords with "leak"  Consults:    Procedures:  12/3 ETT >>12/5 (blown cuff), 12/5 >>  RUE PICC 12/9 >>   Significant Diagnostic Tests:  CTA chest 11/22  >> neg for PE, extensive pneumonia findings BLE Venous Duplex 11/22 >> positive for BLE DVT  Micro Data:  SARS COV 2 11/15 >> positive BCx2 11/16 >> negative UC 12/8 >> negative BCx2 12/8 >>  Tracheal aspirate 12/11 >>  BCx2 12/11 >>  Urinalysis 12/11 >>  Antimicrobials/COVID Rx  Remdesivir 11/16 > 11/19 Ceftriaxone/azithro x1 11/16 Tocilizumab 11/17 Cefepime 12/11 >>  Vanco 12/11 >>   Interim history/subjective:  Air leak noted again today  Peak 47, Plat 43, PEEP 16, FiO2 100% Prone, pending turn this am  Sacral pressure wound noted Vec x1 overnight for vent synchrony  Fever to 102.7 Scant bleeding from ETT / mouth  Objective   Blood pressure (!) 148/48, pulse 65, temperature 97.9 F (36.6 C), temperature source Oral, resp.  rate 12, height 5\' 7"  (1.702 m), weight 95.4 kg, SpO2 (!) 87 %.    Vent Mode: PRVC FiO2 (%):  [100 %] 100 % Set Rate:  [32 bmp] 32 bmp Vt Set:  [500 mL] 500 mL PEEP:  [16 cmH20] 16 cmH20 Plateau Pressure:  [43 cmH20-48 cmH20] 43 cmH20   Intake/Output Summary (Last 24 hours) at 10/28/2019 1107 Last data filed at 10/28/2019 0800 Gross per 24 hour  Intake 2876.05 ml  Output 1800 ml  Net 1076.05 ml   Filed Weights   10/26/19 0451 10/27/19 0325 10/28/19 0500  Weight: 88.9 kg 89.6 kg 95.4 kg    Examination: General: adult male lying in bed, critically ill appearing in NAD, prone  HEENT: MM pink/moist, ETT, air leak noted Neuro: prone, sedate  CV: s1s2 RRR, no m/r/g PULM:  Synchronous on vent GI: soft, bsx4 active, bruising unchanged to lower abd Extremities: warm/dry, trace generalized edema  Skin: no rashes, stage II sacral ulceration with dressing intact  CXR 12/11 >> images personally reviewed, worsening aeration / bilateral opacities, ETT between clavicular heads  Resolved Hospital Problem list   Pneumomediastinum   Assessment & Plan:   Acute hypoxic, hypercapnic respiratory failure from COVID 19 pneumonia complicated by epistaxis and concern for aspiration Peak 47, Plat 43, PEEP 16, FiO2 100% -continue flonase, saline nasal spray  -low Vt ventilation 4-8cc/kg -goal plateau pressure <30, driving pressure [id number] cm H2O -target PaO2 55-65, titrate PEEP/FiO2 per ARDS protocol  -ABG post turn supine, if P/F ratio <150, continue  prone therapy for 16 hours per day -goal CVP <4, diuresis as necessary -VAP prevention measures  -assess CXR post supine to ensure ETT position  Intermittent Fevers - 12/11 Worsening Infiltrates -repeat pan cultures -add empiric cefepime, vanco -follow fever curve, WBC trend  Acute metabolic encephalopathy Need for Sedation secondary to Mechanical Ventilation -RASS Goal -4 to -5 with PRN NMB -continue klonopin, oxycodone   AKI on CKD  3 Hyperkalemia Hypernatremia -continue free water  -kayexalate 15g x1 with repeat BMP -Trend BMP / urinary output -Replace electrolytes as indicated -Avoid nephrotoxic agents, ensure adequate renal perfusion  Anemia  Suspect of critical illness.  Note drift from 11-12 on admit to ~8 -repeat H/H, type & cross -transfuse 1 unit PRBC pending review -transfuse for Hgb <7% or active bleed -if further drop, may need to hold eliquis   Hx of CAD s/p stent 2015, HTN, HLD -continue home coreg at reduced dose, lipitor  -hold home norvasc, valsartan, HCTZ  Acute B/L lower leg DVT from 10/09/19 -eliquis for DVT  DM II -SSI + levemir   Severe Protein Calorie Malnutrition.  -TF per Nutrition   Best practice:  Diet: TF DVT prophylaxis: Full dose anticoagulation GI prophylaxis: famotidine Mobility: bed rest Code Status: full code Disposition: ICU Family: Wife ([place]) updated via phone 12/11  Labs    CMP Latest Ref Rng & Units 10/28/2019 10/27/2019 10/27/2019  Glucose 70 - 99 mg/dL 365(H) 444(H) -  BUN 8 - 23 mg/dL 141(H) 131(H) -  Creatinine 0.61 - 1.24 mg/dL 2.97(H) 2.43(H) -  Sodium 135 - 145 mmol/L 144 145 149(H)  Potassium 3.5 - 5.1 mmol/L 5.0 5.2(H) 5.8(H)  Chloride 98 - 111 mmol/L 104 108 -  CO2 22 - 32 mmol/L 32 31 -  Calcium 8.9 - 10.3 mg/dL 8.2(L) 8.4(L) -  Total Protein 6.5 - 8.1 g/dL - - -  Total Bilirubin 0.3 - 1.2 mg/dL - - -  Alkaline Phos 38 - 126 U/L - - -  AST 15 - 41 U/L - - -  ALT 0 - 44 U/L - - -    CBC Latest Ref Rng & Units 10/28/2019 10/27/2019 10/27/2019  WBC 4.0 - 10.5 K/uL 6.0 - 8.9  Hemoglobin 13.0 - 17.0 g/dL 6.4(LL) 7.5(L) 8.2(L)  Hematocrit 39.0 - 52.0 % 22.6(L) 22.0(L) 30.0(L)  Platelets 150 - 400 K/uL 146(L) - 157    ABG    Component Value Date/Time   PHART 7.362 10/27/2019 0957   PCO2ART 57.8 (H) 10/27/2019 0957   PO2ART 45.0 (L) 10/27/2019 0957   HCO3 33.0 (H) 10/27/2019 0957   TCO2 35 (H) 10/27/2019 0957   O2SAT 79.0 10/27/2019  0957    CBG (last 3)  Recent Labs    10/28/19 0028 10/28/19 0424 10/28/19 0755  GLUCAP 310* 224* 234*    CC Time: 30 minutes  Noe Gens, MSN, NP-C [facility] 10/28/2019, 11:07 AM   Please see [email or web address] for pager details.

## 2019-10-28 NOTE — Progress Notes (Signed)
Pt dropped his Spo2 to 70%. Pt ETT remains secure at the proper placement and tidal volumes adequate on the ventilator. Recruitment maneuver performed and pt SpO2 increased to 87-88% Dr. [place] Bells notified and no new orders at this time. RT will continue to monitor.

## 2019-10-28 NOTE — Progress Notes (Signed)
[facility] PCCM  Repeat Hgb 6.6, unclear if bleeding, no obvious cause Holding eliquis tonight Giving 1 U PRBC Occult blood Repeat CBC in AM If evidence of ongoing bleeding and no clear GI bleed consider CT abdomen to look for RP bleed  [name], MD [facility] Pager: [phone] Cell: [phone] If no response, call [phone]

## 2019-10-28 NOTE — Progress Notes (Signed)
Attending:    Subjective: Air leak again today when head is in certain positions High peak pressure Prone overnight   Objective: Vitals:   10/28/19 0500 10/28/19 0600 10/28/19 0800 10/28/19 0900  BP: 140/63 (!) 135/57 (!) 143/50 (!) 148/48  Pulse: 76 76 70 69  Resp: (!) 32 (!) 23 (!) 32 (!) 32  Temp:  99 F (37.2 C) 97.9 F (36.6 C)   TempSrc:  Oral Oral   SpO2: 91% 92% 93% 94%  Weight: 95.4 kg     Height:       Vent Mode: PRVC FiO2 (%):  [100 %] 100 % Set Rate:  [32 bmp] 32 bmp Vt Set:  [500 mL] 500 mL PEEP:  [16 cmH20] 16 cmH20 Plateau Pressure:  [43 cmH20-48 cmH20] 43 cmH20  Intake/Output Summary (Last 24 hours) at 10/28/2019 0932 Last data filed at 10/28/2019 0800 Gross per 24 hour  Intake 2876.05 ml  Output 1800 ml  Net 1076.05 ml    General:  In bed on vent, prone HENT: NCAT ETT in place PULM: Crackles bases, vent supported breathing CV: prone GI: prone MSK: normal bulk and tone Neuro: sedated on vent    CBC    Component Value Date/Time   WBC 6.0 10/28/2019 0537   RBC 2.56 (L) 10/28/2019 0537   HGB 6.4 (LL) 10/28/2019 0537   HCT 22.6 (L) 10/28/2019 0537   PLT 146 (L) 10/28/2019 0537   MCV 88.3 10/28/2019 0537   MCH 25.0 (L) 10/28/2019 0537   MCHC 28.3 (L) 10/28/2019 0537   RDW 16.4 (H) 10/28/2019 0537   LYMPHSABS 0.5 (L) 10/19/2019 0210   MONOABS 0.3 10/19/2019 0210   EOSABS 0.4 10/19/2019 0210   BASOSABS 0.0 10/19/2019 0210    BMET    Component Value Date/Time   NA 144 10/28/2019 0537   K 5.0 10/28/2019 0537   CL 104 10/28/2019 0537   CO2 32 10/28/2019 0537   GLUCOSE 365 (H) 10/28/2019 0537   BUN 141 (H) 10/28/2019 0537   CREATININE 2.97 (H) 10/28/2019 0537   CALCIUM 8.2 (L) 10/28/2019 0537   GFRNONAA 22 (L) 10/28/2019 0537   GFRAA 25 (L) 10/28/2019 0537    CXR images reviewed from this morning, ETT seems to be well positioned in the trachea  Impression/Plan: Hgb: anemia today, transfuse, monitor for bleeding ARDS: cuff leak  today, check after prone, may need to look at his cuff again after we move back to supine, supine abg today, follow protocol Sedation: add oxycodone and clonazepam Cuff leak on ventilation> ETT has migrated in the last few days requiring repositioning, I suspect we need to do this again.  Will check a CXR first though.  Balloon seems to be intact.  My cc time 32  minutes  [name], MD [facility] Pager: [phone] Cell: [phone] After 3pm or if no response, call [phone]

## 2019-10-28 NOTE — Progress Notes (Signed)
Wife consented via phone for blood administration. Informed her of risks / benefits and that nursing will call for additional formal consent.  Reviewed possible sources of blood loss to include critical illness, phlebotomy, GI, RP bleed and recent epistaxis.  No obvious source.  Will follow.    Noe Gens, MSN, NP-C [facility] 10/28/2019, 6:12 PM   Please see [email or web address] for pager details.

## 2019-10-28 NOTE — Progress Notes (Signed)
Pharmacy Antibiotic Note  William [name] is a 63 y.o. male admitted on 10/06/2019. Pt with worsening infiltrates and fever. Starting empiric antibiotics. Renal fx wrosening, SCr 2.9. Pan-cultured today.  Vancomycin 1500 mg IV Q 24 hrs. Goal AUC 400-550. Expected AUC: 440 SCr used: 2.9   Plan: -Cefepime 1 g IV q24h -Vancomycin 2 g IV q24h then 1500 mg IV q48h -Monitor renal fx, cultures, VR as needed    Height: 5\' 7"  (170.2 cm) Weight: 210 lb 5.1 oz (95.4 kg) IBW/kg (Calculated) : 66.1  Temp (24hrs), Avg:99.3 F (37.4 C), Min:97.7 F (36.5 C), Max:102.7 F (39.3 C)  Recent Labs  Lab 10/24/19 0429 10/25/19 0630 10/26/19 0800 10/27/19 0015 10/27/19 0355 10/27/19 1725 10/28/19 0537  WBC 11.5* 8.7 8.9  --  8.9  --  6.0  CREATININE 2.21* 2.43* 2.30* 2.40* 2.72* 2.43* 2.97*     Antimicrobials this admission: 12/11 vancomycin > 12/11 cefepime >  Dose adjustments this admission: N/A  Microbiology results: 12/8 [facility] x2: ngtd 12/8 BC x2: ngtd 12/8 UCx: ngtd 12/11 Blood cx: 12/11 resp cx:   Harvel Quale 10/28/2019 1:46 PM

## 2019-10-29 ENCOUNTER — Inpatient Hospital Stay (HOSPITAL_COMMUNITY): Payer: Commercial Managed Care - PPO

## 2019-10-29 DIAGNOSIS — Z515 Encounter for palliative care: Secondary | ICD-10-CM

## 2019-10-29 DIAGNOSIS — Z66 Do not resuscitate: Secondary | ICD-10-CM

## 2019-10-29 LAB — CBC
HCT: 26 % — ABNORMAL LOW (ref 39.0–52.0)
Hemoglobin: 7.6 g/dL — ABNORMAL LOW (ref 13.0–17.0)
MCH: 25.8 pg — ABNORMAL LOW (ref 26.0–34.0)
MCHC: 29.2 g/dL — ABNORMAL LOW (ref 30.0–36.0)
MCV: 88.1 fL (ref 80.0–100.0)
Platelets: 167 10*3/uL (ref 150–400)
RBC: 2.95 MIL/uL — ABNORMAL LOW (ref 4.22–5.81)
RDW: 15.8 % — ABNORMAL HIGH (ref 11.5–15.5)
WBC: 7.4 10*3/uL (ref 4.0–10.5)
nRBC: 3.4 % — ABNORMAL HIGH (ref 0.0–0.2)

## 2019-10-29 LAB — GLUCOSE, CAPILLARY
Glucose-Capillary: 203 mg/dL — ABNORMAL HIGH (ref 70–99)
Glucose-Capillary: 266 mg/dL — ABNORMAL HIGH (ref 70–99)

## 2019-10-29 MED ORDER — DIPHENHYDRAMINE HCL 50 MG/ML IJ SOLN: 25.0000 mg | INTRAMUSCULAR | Status: DC | PRN

## 2019-10-29 MED ORDER — ACETAMINOPHEN 650 MG RE SUPP: 650.0000 mg | Freq: Four times a day (QID) | RECTAL | Status: DC | PRN

## 2019-10-29 MED ORDER — STERILE WATER FOR INJECTION IJ SOLN
INTRAMUSCULAR | Status: AC
  Administered 2019-10-29: 05:00:00
  Filled 2019-10-29: qty 10

## 2019-10-29 MED ORDER — GLYCOPYRROLATE 0.2 MG/ML IJ SOLN: 0.2000 mg | INTRAMUSCULAR | Status: DC | PRN

## 2019-10-29 MED ORDER — MORPHINE SULFATE (PF) 2 MG/ML IV SOLN: 2.0000 mg | INTRAVENOUS | Status: DC | PRN

## 2019-10-29 MED ORDER — SODIUM POLYSTYRENE SULFONATE 15 GM/60ML PO SUSP: 15.0000 g | Freq: Once | ORAL | Status: DC

## 2019-10-29 MED ORDER — GLYCOPYRROLATE 1 MG PO TABS
1.0000 mg | ORAL_TABLET | ORAL | Status: DC | PRN
  Filled 2019-10-29: qty 1

## 2019-10-29 MED ORDER — LORAZEPAM 2 MG/ML IJ SOLN: 2.0000 mg | INTRAMUSCULAR | Status: DC | PRN

## 2019-10-29 MED ORDER — ACETAMINOPHEN 325 MG PO TABS: 650.0000 mg | ORAL_TABLET | Freq: Four times a day (QID) | ORAL | Status: DC | PRN

## 2019-10-29 MED ORDER — DEXTROSE 5 % IV SOLN: INTRAVENOUS | Status: DC

## 2019-10-29 MED ORDER — POLYVINYL ALCOHOL 1.4 % OP SOLN
1.0000 [drp] | Freq: Four times a day (QID) | OPHTHALMIC | Status: DC | PRN
  Filled 2019-10-29: qty 15

## 2019-10-31 LAB — CULTURE, BLOOD (ROUTINE X 2)
Culture: NO GROWTH
Culture: NO GROWTH
Special Requests: ADEQUATE

## 2019-10-31 LAB — BPAM RBC
Blood Product Expiration Date: 202101112359
ISSUE DATE / TIME: 202012112319
Unit Type and Rh: 7300

## 2019-10-31 LAB — TYPE AND SCREEN
ABO/RH(D): B POS
Antibody Screen: NEGATIVE
Unit division: 0

## 2019-11-01 LAB — GLUCOSE, CAPILLARY: Glucose-Capillary: 215 mg/dL — ABNORMAL HIGH (ref 70–99)

## 2019-11-02 LAB — CULTURE, RESPIRATORY W GRAM STAIN

## 2019-11-02 LAB — CULTURE, BLOOD (ROUTINE X 2)
Culture: NO GROWTH
Culture: NO GROWTH

## 2019-11-18 NOTE — Progress Notes (Signed)
At approximately 0420, Primary nurse and RTs turned pt head and arms, pt desat into 70s and maintained in 70's. With adequate sedation and syncrony with vent pt o2 still remained in 70's. MD notified.  Pt wife called for an update. Primary nurse notified wife, William [name] of pt current condition at approx 0500. Wife wanted to know where we went from here. Primary nurse advised wife of options that the pt had and discussed goals of care. Pt wife decided that she wanted to make pt a DNR and plans on making pt a Comfort Care at approximately 0900 today. MD made aware. MD spoke with William [name], wife, and pt was made a DNR. Will continue to monitor.

## 2019-11-18 NOTE — Progress Notes (Signed)
PCCM:  I had a long discussion with the patient's wife Sybil via phone.  She states that she has been thinking about her husband continuously for the past week.  She knows that he would not want to be on a breathing machine.  And she could sleep all night last night worried about him.  She tells me that she has already contacted [employer] [facility] and would like her husband removed from mechanical support.  We discussed the patient's current clinical status.  Currently on maximal ventilatory support with O2 sats in the 80s.  She did come to see the patient this morning around 9 AM.  She did express her wishes to the staff at this time regarding transition to comfort care measures.  I called and spoke with her and discussed of these in detail.  She would like to move forward with compassionate liberation from mechanical support.  Orders placed.  Garner Nash, DO [facility] 11/22/19 11:31 AM

## 2019-11-18 NOTE — Plan of Care (Addendum)
Pt expired 1222 after extubation on comfort care.  S/p extubation, played 3 songs per request of pt's spouse:  "What a Wonderful [place]" [place]; "Christmas Song" [place]; and Disney "[employer]".  Pt shed single tear during "What a Wonderful World".

## 2019-11-18 NOTE — Progress Notes (Addendum)
NAME:  William [name], MRN:  [id number], DOB:  09/21/1956, LOS: 18 ADMISSION DATE:  10/04/2019, CONSULTATION DATE:  12/3 REFERRING MD:  Cathlean Sauer, CHIEF COMPLAINT:  Dyspnea, epistaxis   Brief History   64 y/o male admitted on 11/16 with severe acute respiratory failure with hypoxemia due to COVID 19 pneumonia. He was treated for three weeks with standard therapy, but did not have improvement in oxygenation.  On 12/3 he developed epistaxis and worsening hypoxemia and was moved to the ICU.  Past Medical History  Hypertension CAD DM2 NSTEMI Sickle cell trait  Significant Hospital Events   11/16 admit, treated with heated high flow 11/19 move from PCU 12/03 back to ICU epistaxis, intubated 12/05 cuff blown on ETT >> replaced ETT; resumed anticoagulation 12/06 removed nasal packing, PaO2:FiO2 80.  FiO2 100%, PEEP 14.  No further epistaxis. 12/07 100% FiO2, PEEP 14, Peak 44, Plat 28, PCV ventilation, supine 0600 12/09 tx to PO anticoagulation  12/10 pt reintubated (same tube), cuff above cords with "leak" 12/11 anemia, tx 1 unit PRBC 12/12 worsening renal failure, hypotension on pressors  Consults:    Procedures:  12/3 ETT >>12/5 (blown cuff), 12/5 >>  RUE PICC 12/9 >>   Significant Diagnostic Tests:  CTA chest 11/22  >> neg for PE, extensive pneumonia findings BLE Venous Duplex 11/22 >> positive for BLE DVT  Micro Data:  SARS COV 2 11/15 >> positive BCx2 11/16 >> negative UC 12/8 >> negative BCx2 12/8 >>  Tracheal aspirate 12/11 >> abundant WBC, abundant GPR's, few GPC's >>  BCx2 12/11 >>  Urinalysis 12/11 >> negative  Antimicrobials/COVID Rx  Remdesivir 11/16 > 11/19 Ceftriaxone/azithro x1 11/16 Tocilizumab 11/17 Cefepime 12/11 >>  Vanco 12/11 >>   Interim history/subjective:  Episodes of desaturation overnight, hypotension, rhythm disturbances Wife called and made patient DNR, elected to visit this am and likely withdraw aggressive support Tmax 99.8 Levophed 5  mcg's Peak 52, Pplat 50, FiO2 100%, PEEP 20  Objective   Blood pressure (!) 97/52, pulse 66, temperature 99.8 F (37.7 C), temperature source Oral, resp. rate (!) 0, height 5\' 7"  (1.702 m), weight 95.6 kg, SpO2 (!) 82 %.    Vent Mode: PRVC FiO2 (%):  [100 %] 100 % Set Rate:  [32 bmp] 32 bmp Vt Set:  [500 mL] 500 mL PEEP:  [16 cmH20] 16 cmH20 Plateau Pressure:  [40 cmH20-41 cmH20] 41 cmH20   Intake/Output Summary (Last 24 hours) at Nov 27, 2019 0806 Last data filed at 11/27/19 [id number] Gross per 24 hour  Intake 2584.14 ml  Output 585 ml  Net 1999.14 ml   Filed Weights   10/27/19 0325 10/28/19 0500 11-27-2019 0500  Weight: 89.6 kg 95.4 kg 95.6 kg    Examination: General: critically ill appearing male lying in bed on vent, prone position  HEENT: MM pink/moist, ETT, mild facial edema, right eye conjunctival bleeding  Neuro: sedate  CV: s1s2 RRR, no m/r/g PULM:  Non-labored, synchronous on vent, lungs bilaterally coarse GI: soft, bsx4 active  Extremities: warm/dry, trace generalized edema  Skin: no rashes or lesions  CXR 12/12 >> images personally reviewed, ETT in good position, severe bilateral airspace opacities  Resolved Hospital Problem list   Pneumomediastinum   Assessment & Plan:   Acute hypoxic, hypercapnic respiratory failure from COVID 19 pneumonia complicated by epistaxis and concern for aspiration -continue current supportive measures -family to visit this am and make decisions regarding further plan of care  -low Vt ventilation 4-8cc/kg -goal plateau pressure <30, driving pressure [id number]  cm H2O -target PaO2 55-65, titrate PEEP/FiO2 per ARDS protocol  -consider repeat prone pending discussion with family  -VAP prevention measures  -follow intermittent CXR   Intermittent Fevers - 12/11 Worsening Infiltrates -follow culture -continue empiric abx as above  Acute metabolic encephalopathy Need for Sedation secondary to Mechanical Ventilation -RASS Goal -4 to -5  with PRN NMB -continue klonopin, oxycodone   AKI on CKD 3 Hyperkalemia Hypernatremia -continue free water -repeat kayexalate  -kayexalate 15g x1 with repeat BMP -Trend BMP / urinary output -Replace electrolytes as indicated -Avoid nephrotoxic agents, ensure adequate renal perfusion  Anemia  Suspect of critical illness.  Note drift from 11-12 on admit to ~8 -follow CBC -transfuse for Hgb <7% or active bleeding  -hold eliquis if further drop or bleeding   Hx of CAD s/p stent 2015, HTN, HLD -continue home coreg, lipitor  -hold home norvasc, HCTZ, valsartan  Acute B/L lower leg DVT from 10/09/19 -continue eliquis for DVT  DM II -SSI + levemir  Severe Protein Calorie Malnutrition.  -TF per Nutrition  Best practice:  Diet: TF DVT prophylaxis: Full dose anticoagulation GI prophylaxis: famotidine Mobility: bed rest Code Status: full code Disposition: ICU Family: Wife ([place]) updated via phone 12/12.  She is pending visit with her husband and will call me back.   Labs    CMP Latest Ref Rng & Units 10/28/2019 10/28/2019 10/28/2019  Glucose 70 - 99 mg/dL 299(H) - 285(H)  BUN 8 - 23 mg/dL 166(H) - 162(H)  Creatinine 0.61 - 1.24 mg/dL 3.26(H) - 3.03(H)  Sodium 135 - 145 mmol/L 145 145 146(H)  Potassium 3.5 - 5.1 mmol/L 5.5(H) 5.2(H) 4.9  Chloride 98 - 111 mmol/L 104 - 105  CO2 22 - 32 mmol/L 30 - 34(H)  Calcium 8.9 - 10.3 mg/dL 8.6(L) - 8.6(L)  Total Protein 6.5 - 8.1 g/dL - - 6.0(L)  Total Bilirubin 0.3 - 1.2 mg/dL - - 0.2(L)  Alkaline Phos 38 - 126 U/L - - 90  AST 15 - 41 U/L - - 497(H)  ALT 0 - 44 U/L - - 257(H)    CBC Latest Ref Rng & Units 2019-11-19 10/28/2019 10/28/2019  WBC 4.0 - 10.5 K/uL 7.4 - -  Hemoglobin 13.0 - 17.0 g/dL 7.6(L) 7.8(L) 6.6(LL)  Hematocrit 39.0 - 52.0 % 26.0(L) 23.0(L) 23.0(L)  Platelets 150 - 400 K/uL 167 - -    ABG    Component Value Date/Time   PHART 7.290 (L) 10/28/2019 1756   PCO2ART 67.0 (HH) 10/28/2019 1756   PO2ART 42.0 (L)  10/28/2019 1756   HCO3 32.1 (H) 10/28/2019 1756   TCO2 34 (H) 10/28/2019 1756   O2SAT 69.0 10/28/2019 1756    CBG (last 3)  Recent Labs    10/28/19 2004 11-19-19 0401 11-19-2019 0753  GLUCAP 259* 203* 266*    CC Time: 30 minutes  Noe Gens, MSN, NP-C [facility] 2019/11/19, 8:06 AM   Please see [email or web address] for pager details.    PCCM attending:  Please see documentation above.  Please see separate note from conversation with wife today.  Patient's wife is requesting compassionate withdrawal from mechanical support and transition to comfort measures.  Please see additional documentation from overnight until this morning.  Comfort care orders placed.  Garner Nash, DO [place] Nov 19, 2019 11:32 AM

## 2019-11-18 NOTE — Progress Notes (Signed)
Patient transported to family visit room for family visitation.  Patient transported on current vent settings.  Patient tolerated well.  No complications noted.

## 2019-11-18 NOTE — Progress Notes (Signed)
[place] Progress Note Patient Name: William [name] DOB: 1955-12-10 MRN: [id number]   Date of Service  11/08/19  HPI/Events of Note  Notified of desaturation with patient remaining proned and adequately sedated. Synchronized on the vent. PEEP had been increased to 22 with saturation at 80%. CXR without pneumothorax.  eICU Interventions  Wife was updated by bedside RN and she would want to make the patient DNR status.  I spoke with her on phone as well and confirmed this.  She also states that she will be coming in at around 9 am and will likely proceed with comfort care.     Intervention Category Major Interventions: Respiratory failure - evaluation and management Minor Interventions: Communication with other healthcare providers and/or family  Judd Lien Nov 08, 2019, 5:55 AM

## 2019-11-18 NOTE — Progress Notes (Signed)
RTx2 turned pt's head at 0420 pt did not tolerate well pt desat to 70's RN gave sedation and RT attempted recruitment maneuvers. Still unable to get O2 sat to come to acceptable level, RTx2 decided to turn pt's head back to try and recover pt. However, pt O2 still would not come up, RT checked tube placement, ETT not kinked, still in acceptable position according to morning CXR. E-link notified

## 2019-11-18 NOTE — Progress Notes (Signed)
Pt was escorted by '@2'$  RN's Gaspar Skeeters and Duard Brady ) as well as Orma Render and Charge Nurse Levada Dy to family visiting room. Pt's daughters and wife spent time with the patient. Emotional support provided. Pts wife initially declined to take any of patients belongings but as she was leaving asked for his robe and cell phone. Cell phone, wedding ring, glasses, pair of shoes ,robe ,shaving kit ,electric razor,disney keychain, loose pocket change  Returned to patients wife by [place] ,Charna Archer. Witness Charisse Klinefelter to items placed in bag. Spoke with wife on the way out about her husband and asked if any songs we could play for patient and she and daughters shared some songs for [place] to play for patient.   Ellamae Sia

## 2019-11-18 NOTE — Death Summary Note (Signed)
DEATH SUMMARY   Patient Details  Name: William [name] MRN: [id number] DOB: 1956/03/18  Admission/Discharge Information   Admit Date:  Oct 16, 2019  Date of Death: Date of Death: 11-Nov-2019  Time of Death: Time of Death: 1221-02-21  Length of Stay: 2023-02-23  Referring Physician: [name], MD   Reason(s) for Hospitalization  64 y/o male admitted on October 16, 2023 with severe acute respiratory failure with hypoxemia due to COVID 19 pneumonia. He was treated for three weeks with standard therapy, but did not have improvement in oxygenation.  On 12/3 he developed epistaxis and worsening hypoxemia and was moved to the ICU.   Diagnoses  Preliminary cause of death: COVID-19 virus infection Secondary Diagnoses (including complications and co-morbidities):  Active Problems:   Pneumonia due to COVID-19 virus   Diabetes mellitus type 2, uncontrolled, with complications (HCC)   Chronic diastolic CHF (congestive heart failure) (HCC)   Hyperkalemia   Acute renal failure superimposed on stage 3a chronic kidney disease (HCC)   Acute respiratory failure with hypoxia (HCC)   Essential hypertension   Protein-calorie malnutrition, severe   COVID-19   Pressure injury of skin   Brief Hospital Course (including significant findings, care, treatment, and services provided and events leading to death)  William [name] is a 64 y.o. year old male who was admitted on Oct 16, 2023 with severe acute respiratory failure with hypoxemia due to COVID 19 pneumonia. He was treated for three weeks with standard therapy, but did not have improvement in oxygenation.  On 12/3 he developed epistaxis and worsening hypoxemia and was moved to the ICU.  Significant Hospital Events   10/16/23 admit, treated with heated high flow 11/19 move from PCU 12/03 back to ICU epistaxis, intubated 12/05 cuff blown on ETT >> replaced ETT; resumed anticoagulation 12/06 removed nasal packing, PaO2:FiO2 80.  FiO2 100%, PEEP 14.  No further epistaxis. 12/07  100% FiO2, PEEP 14, Peak 44, Plat 28, PCV ventilation, supine 0600 12/09 tx to PO anticoagulation  12/10 pt reintubated (same tube), cuff above cords with "leak" 12/11 anemia, tx 1 unit PRBC 11/11/2023 worsening renal failure, hypotension on pressors  Consults:    Procedures:  12/3 ETT >>12/5 (blown cuff), 12/5 >>  RUE PICC 12/9 >>   Significant Diagnostic Tests:  CTA chest 11/22  >> neg for PE, extensive pneumonia findings BLE Venous Duplex 11/22 >> positive for BLE DVT  Micro Data:  SARS COV 2 11/15 >> positive BCx2 10-16-2023 >> negative UC 12/8 >> negative BCx2 12/8 >>  Tracheal aspirate 12/11 >> abundant WBC, abundant GPR's, few GPC's >>  BCx2 12/11 >>  Urinalysis 12/11 >> negative  Antimicrobials/COVID Rx  Remdesivir Oct 16, 2023 > 11/19 Ceftriaxone/azithro x1 October 16, 2023 Tocilizumab 11/17 Cefepime 12/11 >>  Vanco 12/11 >>   Interim history/subjective:  Episodes of desaturation overnight, hypotension, rhythm disturbances Wife called and made patient DNR, elected to visit this am and likely withdraw aggressive support Tmax 99.8 Levophed 5 mcg's Peak 52, Pplat 50, FiO2 100%, PEEP 20   Acute hypoxic, hypercapnic respiratory failure from COVID 19 pneumonia complicated by epistaxis and concern for aspiration  Acute metabolic encephalopathy Need for Sedation secondary to Mechanical Ventilation  AKI on CKD 3 Hyperkalemia Hypernatremia  Anemia   Hx of CAD s/p stent February 21, 2014, HTN, HLD  Acute B/[name] lower leg DVT from 10/09/19  DM II  Severe Protein Calorie Malnutrition.   Ultimately, the patients wife called and decided that she would like to pursue comfort care measures. She was very clear about her decision. She was able  to come to the hospital to visit him prior to passing. The patient was liberated from the ventilator and passed peacefully in the icu.   Pertinent Labs and Studies  Significant Diagnostic Studies DG Chest 1 View  Result Date: 10/22/2019 CLINICAL DATA:   Shortness of breath.  COVID-19 positive. EXAM: CHEST  1 VIEW COMPARISON:  Multiple previous chest films. The most recent is 10/21/2019 FINDINGS: The endotracheal tube is 5 cm above the carina. The NG tube is coursing down the esophagus and into the stomach. The heart is mildly enlarged but appears stable. Persistent diffuse interstitial and airspace process in the lungs slightly lower lobe predominant. No obvious pleural effusions or pneumothorax. IMPRESSION: 1. Stable support apparatus. 2. Persistent diffuse interstitial and airspace process. Electronically Signed   By: Marijo Sanes M.D.   On: 10/22/2019 08:57   DG Chest 1 View  Result Date: 10/20/2019 CLINICAL DATA:  COVID-19 positive. EXAM: CHEST  1 VIEW COMPARISON:  October 15, 2019. FINDINGS: The heart size and mediastinal contours are within normal limits. No pneumothorax is noted. Stable bilateral lung opacities are noted concerning for multifocal pneumonia. The visualized skeletal structures are unremarkable. IMPRESSION: Stable bilateral lung opacities are noted concerning for multifocal pneumonia. Electronically Signed   By: Marijo Conception M.D.   On: 10/20/2019 16:12   CTA PE  Result Date: 10/09/2019 CLINICAL DATA:  COVID pneumonia, shortness of breath, concern for PE EXAM: CT ANGIOGRAPHY CHEST WITH CONTRAST TECHNIQUE: Multidetector CT imaging of the chest was performed using the standard protocol during bolus administration of intravenous contrast. Multiplanar CT image reconstructions and MIPs were obtained to evaluate the vascular anatomy. CONTRAST:  60mL OMNIPAQUE IOHEXOL 350 MG/ML SOLN COMPARISON:  None. FINDINGS: Cardiovascular: No significant filling defect or pulmonary embolus demonstrated by CTA. Pulmonary arteries appear patent. Limited assessment of the lower lobes with motion artifact from breathing. Minor aortic atherosclerosis. Negative for aneurysm or dissection. Patent two-vessel arch anatomy. No mediastinal hemorrhage or  hematoma. Native coronary atherosclerosis noted. Heart size without pericardial effusion Mediastinum/Nodes: Prominent thyroid noted extending substernal. Trachea and esophagus unremarkable. Esophagus is nondilated. Mildly prominent prevascular, hilar and subcarinal lymph nodes, suspect reactive/inflammatory. No bulky adenopathy. Lungs/Pleura: Extensive diffuse and patchy ground-glass opacities throughout all lobes of both lungs with some sparing of the lung apices. There is associated bibasilar consolidation/atelectasis with central air bronchograms. Appearance compatible with atypical pneumonia/viral pneumonia as seen with COVID-19. No significant effusion. No other pleural abnormality or pneumothorax. Trachea and central airways remain patent. No mucous plugging or obstruction. Upper Abdomen: Fatty infiltration of the liver suspected. No acute finding in the upper abdomen. Musculoskeletal: No chest wall abnormality. No acute or significant osseous findings. Review of the MIP images confirms the above findings. IMPRESSION: 1. Negative for significant acute pulmonary embolus by CTA. 2. Extensive diffuse and patchy ground-glass opacities throughout all lobes of both lungs with some sparing of the lung apices. Findings compatible with atypical pneumonia/viral pneumonia as seen with COVID-19. Aortic Atherosclerosis (ICD10-I70.0). Electronically Signed   By: Jerilynn Mages.  Shick M.D.   On: 10/09/2019 09:23   DG Chest Port 1 View  Result Date: 22-Nov-2019 CLINICAL DATA:  64 year old male with respiratory failure. COVID-19. EXAM: PORTABLE CHEST 1 VIEW COMPARISON:  Portable chest 10/28/2019 and earlier. FINDINGS: Prone PA view at 0343 hours. Endotracheal tube tip about 15 millimeters above the carina. Feeding tube courses to the abdomen, tip not included. Right PICC line remains in place, tip difficult to identify today. Severe widespread bilateral pulmonary opacity persists and appears  mildly progressed in the upper lobes from  1119 hours yesterday. This might be related to lower lung volumes, as the image closely resembles that at 0536 hours yesterday. Visible mediastinal contours are stable. No pneumothorax. IMPRESSION: 1.  Stable lines and tubes. 2. Severe bilateral pulmonary opacity stable since 0536 hours yesterday. Electronically Signed   By: Genevie Ann M.D.   On: 11-01-19 07:34   DG CHEST PORT 1 VIEW  Result Date: 10/28/2019 CLINICAL DATA:  Acute respiratory failure with hypoxemia. EXAM: PORTABLE CHEST 1 VIEW COMPARISON:  October 28, 2019. FINDINGS: The heart size and mediastinal contours are within normal limits. Endotracheal and feeding tubes are in good position. Right-sided PICC line is unchanged. No pneumothorax or pleural effusion is noted. Stable bilateral lung opacities are noted concerning for multifocal pneumonia. The visualized skeletal structures are unremarkable. IMPRESSION: Stable support apparatus. Stable bilateral lung opacities are noted concerning for multifocal pneumonia. Followup radiographs are recommended until resolution. Electronically Signed   By: Marijo Conception M.D.   On: 10/28/2019 12:11   DG Chest Port 1 View  Result Date: 10/28/2019 CLINICAL DATA:  Acute respiratory failure with hypoxemia EXAM: PORTABLE CHEST 1 VIEW COMPARISON:  Yesterday FINDINGS: Endotracheal tube tip is between the clavicular heads and carina. The feeding tube at least reaches the stomach. Right PICC with tip at the upper right atrium. Worsening aeration with extensive bilateral airspace disease which has progressed. No visible effusion or pneumothorax. Generous heart size accentuated by low volumes. IMPRESSION: 1. Worsening aeration with severe airspace disease that is progressed. 2. Stable hardware positioning. Electronically Signed   By: Monte Fantasia M.D.   On: 10/28/2019 08:11   DG CHEST PORT 1 VIEW  Result Date: 10/27/2019 CLINICAL DATA:  Acute respiratory failure with hypoxia EXAM: PORTABLE CHEST 1 VIEW  COMPARISON:  Yesterday FINDINGS: Endotracheal tube tip between the clavicular heads and carina. Right PICC with tip at the upper cavoatrial junction. There is a feeding tube which reaches the stomach. Extensive bilateral airspace disease without interval change. No effusion or pneumothorax. Normal heart size and mediastinal contours. IMPRESSION: Stable bilateral pneumonia and unremarkable hardware positioning. Electronically Signed   By: Monte Fantasia M.D.   On: 10/27/2019 08:55   DG CHEST PORT 1 VIEW  Result Date: 10/26/2019 CLINICAL DATA:  Status post PICC line placement EXAM: PORTABLE CHEST 1 VIEW COMPARISON:  None. FINDINGS: There has been interval placement of a right-sided PICC with the catheter tip at the superior cavoatrial junction. No pneumothorax. ET tube is 3.5 cm above the level of the carina. Dobbhoff tube is seen coursing below the diaphragm. There is slightly increased interval hazy/patchy airspace opacities seen throughout both lungs. A probable trace right effusion is seen. IMPRESSION: 1. Interval placement of right-sided PICC with the catheter at the superior cavoatrial junction. 2. interval worsening in the hazy/patchy airspace opacity throughout both lungs likely consistent with multifocal pneumonia Electronically Signed   By: Prudencio Pair M.D.   On: 10/26/2019 14:18   DG Chest Port 1 View  Result Date: 10/26/2019 CLINICAL DATA:  Acute respiratory failure with hypoxia. EXAM: PORTABLE CHEST 1 VIEW COMPARISON:  10/25/2019 FINDINGS: Endotracheal tube terminates slightly inferior to the clavicles and well above the carina, unchanged. Enteric tube courses into the abdomen. The cardiomediastinal silhouette is unchanged. Patchy airspace opacities throughout both lungs are unchanged. No sizable pleural effusion or pneumothorax is identified. IMPRESSION: Unchanged bilateral lung opacities consistent with pneumonia. Electronically Signed   By: Logan Bores M.D.   On: 10/26/2019  08:35   DG  Chest Port 1 View  Result Date: 10/25/2019 CLINICAL DATA:  Acute respiratory failure with hypoxia, COVID-19 EXAM: PORTABLE CHEST 1 VIEW COMPARISON:  Portable exam 0615 hours compared to 10/24/2019 FINDINGS: Tip of endotracheal tube projects 5.7 cm above carina. Nasogastric tube coiled in stomach. Stable heart size mediastinal contours. Patchy BILATERAL airspace infiltrates consistent with multifocal pneumonia and history of COVID-19. No pleural effusion or pneumothorax. Bones unremarkable. IMPRESSION: Persistent BILATERAL pulmonary infiltrates consistent with COVID-19 pneumonia. Electronically Signed   By: Lavonia Dana M.D.   On: 10/25/2019 08:39   DG CHEST PORT 1 VIEW  Result Date: 10/24/2019 CLINICAL DATA:  Hypoxia EXAM: PORTABLE CHEST 1 VIEW COMPARISON:  10/24/2019 FINDINGS: Support Apparatus: --Endotracheal tube: Tip at the level of the clavicular heads. --Enteric tube:Tip and sideport project over the stomach. --Catheter(s):None --Other: None Bilateral basilar predominant opacities. Normal cardiomediastinal contours. Persistent subcutaneous emphysema over the upper right chest. IMPRESSION: 1. Persistent subcutaneous emphysema over the upper right chest. 2. Bilateral basilar predominant opacities, unchanged. Electronically Signed   By: Ulyses Jarred M.D.   On: 10/24/2019 22:43   DG Chest Port 1 View  Result Date: 10/24/2019 CLINICAL DATA:  COVID-19 EXAM: PORTABLE CHEST 1 VIEW COMPARISON:  Yesterday FINDINGS: Endotracheal tube tip between the clavicular heads and carina. Enteric tube exchange with tip at the stomach. The left IJ line has been removed. New pneumomediastinum with soft tissue emphysema in the right more than left upper chest wall and neck. No visible pneumothorax. Bilateral airspace disease with improvement in aeration compared to yesterday. Normal heart size. These results will be called to the ordering clinician or representative by the Radiologist Assistant, and communication documented  in the PACS or zVision Dashboard. IMPRESSION: 1. New pneumomediastinum and chest wall emphysema. No visible pneumothorax. 2. Bilateral pneumonia with increased lung volumes compared yesterday. 3. Unremarkable remaining hardware. Electronically Signed   By: Monte Fantasia M.D.   On: 10/24/2019 06:41   DG Chest Port 1 View  Result Date: 10/23/2019 CLINICAL DATA:  COVID-19 positive pneumonia EXAM: PORTABLE CHEST 1 VIEW COMPARISON:  Chest radiograph from the prior day. FINDINGS: An endotracheal tube terminates in the upper thoracic trachea. An enteric tube terminates in the stomach. A left internal jugular central venous catheter tip overlies the brachiocephalic vein. The cardiac silhouette is obscured. Severe diffuse bilateral interstitial and airspace opacities have increased since prior exam. There is no pleural effusion or pneumothorax. IMPRESSION: 1. Severe diffuse bilateral interstitial and airspace opacities have increased since prior exam and are consistent with COVID-19 pneumonia. Electronically Signed   By: Zerita Boers M.D.   On: 10/23/2019 14:07   DG CHEST PORT 1 VIEW  Result Date: 10/21/2019 CLINICAL DATA:  COVID-19 positive patient. EXAM: PORTABLE CHEST 1 VIEW COMPARISON:  October 20, 2019 FINDINGS: The ETT is in good position. The NG tube terminates below today's film. The cardiomediastinal silhouette is stable. No pneumothorax. Bilateral pulmonary infiltrates remain, more prominent on the right and mildly improved on the left in the interval. IMPRESSION: 1. Support apparatus as above. 2. Bilateral pulmonary infiltrates remain, more prominent on the right and mildly improved on the left in the interval. Recommend follow-up to complete resolution. Electronically Signed   By: Dorise Bullion III M.D   On: 10/21/2019 22:54   DG CHEST PORT 1 VIEW  Result Date: 10/20/2019 CLINICAL DATA:  64 year old male status post intubation. EXAM: PORTABLE CHEST 1 VIEW COMPARISON:  Earlier chest radiograph  dated 10/20/2019. FINDINGS: Endotracheal tube with tip approximately  4.5 cm above the carina. An enteric tube extends below the diaphragm making a single loop in the upper abdomen with tip to the left of the spine likely in the proximal stomach. There is shallow inspiration. No significant interval change in the bilateral airspace opacities since the earlier radiograph. No large pleural effusion or pneumothorax. Stable cardiac silhouette. No acute osseous pathology. IMPRESSION: 1. Endotracheal tube above the carina. 2. Enteric tube with tip in the proximal stomach. 3. No significant interval change in the bilateral airspace opacities. Electronically Signed   By: Anner Crete M.D.   On: 10/20/2019 18:56   DG CHEST PORT 1 VIEW  Result Date: 10/15/2019 CLINICAL DATA:  COVID-19 positive, shortness of breath. EXAM: PORTABLE CHEST 1 VIEW COMPARISON:  Chest x-rays dated 10/13/2019 and 10/04/2019. FINDINGS: Ground-glass opacities are again seen at the lung bases bilaterally, improved compared to the most recent chest x-ray of 10/13/2019, now similar to the earlier plain film of 10/04/2019. No pneumothorax seen. Heart size and mediastinal contours are stable. IMPRESSION: Bibasilar pneumonia, improved compared to most recent chest x-ray of 10/13/2019. Electronically Signed   By: Franki Cabot M.D.   On: 10/15/2019 08:20   DG CHEST PORT 1 VIEW  Result Date: 10/13/2019 CLINICAL DATA:  Shortness of breath, COVID-19 EXAM: PORTABLE CHEST 1 VIEW COMPARISON:  CTA chest dated 10/09/2019 FINDINGS: Multifocal pneumonia with relative sparing of the left upper lobe. No definite pleural effusions. No pneumothorax. Cardiomegaly. IMPRESSION: Multifocal pneumonia in this patient with known COVID. Electronically Signed   By: Julian Hy M.D.   On: 10/13/2019 09:44   DG CHEST PORT 1 VIEW  Result Date: 10/04/2019 CLINICAL DATA:  Hypoxia EXAM: PORTABLE CHEST 1 VIEW COMPARISON:  09/27/2019 FINDINGS: Interstitial/patchy  opacities in the bilateral upper and lower lobes, unchanged. No definite pleural effusions. No pneumothorax. The heart is top-normal in size. IMPRESSION: Multifocal pneumonia in this patient with known COVID, unchanged. Electronically Signed   By: Julian Hy M.D.   On: 10/04/2019 10:07   DG Chest Portable 1 View  Result Date: 09/19/2019 CLINICAL DATA:  Hypoxia, shortness of breath. EXAM: PORTABLE CHEST 1 VIEW COMPARISON:  October 02, 2019. FINDINGS: The heart size and mediastinal contours are within normal limits. No pneumothorax or pleural effusion is noted. Stable left perihilar and lingular opacity is noted as well as mild right upper lobe opacity consistent with multifocal pneumonia. The visualized skeletal structures are unremarkable. IMPRESSION: Stable bilateral lung opacities are noted, left greater than right, consistent with multifocal pneumonia. Electronically Signed   By: Marijo Conception M.D.   On: 09/23/2019 14:14   DG Chest Port 1 View  Result Date: 10/06/2019 CLINICAL DATA:  Pt complains of chest pain x 3 days, non-radiating, worse with inspiration, cough, fever, and generally feeling unwell. Pt was unable to hold deep inspiration for x-ray due to coughing. EXAM: PORTABLE CHEST 1 VIEW COMPARISON:  01/07/2014 FINDINGS: Hazy interstitial and airspace opacities present in the left perihilar region, right upper lobe, and left lower lobe. Low lung volumes are present, causing crowding of the pulmonary vasculature. No blunting of the costophrenic angles. Cardiac and mediastinal margins appear normal. IMPRESSION: 1. Hazy interstitial and airspace opacities in the left perihilar region, right upper lobe, and left lower lobe, suspicious for multilobar pneumonia or atypical pneumonia. 2. Low lung volumes. Electronically Signed   By: Van Clines M.D.   On: 09/29/2019 18:57   ECHOCARDIOGRAM COMPLETE  Result Date: 10/04/2019   ECHOCARDIOGRAM REPORT   Patient Name:  William [name]  Date of Exam: 10/04/2019 Medical Rec #:  [id number]         Height:       67.0 in Accession #:    [id number]        Weight:       225.0 lb Date of Birth:  March 08, 1956          BSA:          2.13 m Patient Age:    75 years          BP:           144/65 mmHg Patient Gender: M                 HR:           85 bpm. Exam Location:  Inpatient Procedure: 2D Echo, Cardiac Doppler and Color Doppler Indications:    Dyspnea  History:        Patient has prior history of Echocardiogram examinations, most                 recent 01/08/2014. Risk Factors:Hypertension and Dyslipidemia.                 Resp. failure.  Sonographer:    Dustin Flock Referring Phys: [place]  1. Left ventricular ejection fraction, by visual estimation, is 60 to 65%. The left ventricle has normal function. There is moderately increased left ventricular hypertrophy.  2. Left ventricular diastolic parameters are consistent with Grade I diastolic dysfunction (impaired relaxation).  3. Global right ventricle has normal systolic function.The right ventricular size is normal.  4. Right atrial size was normal.  5. The mitral valve is normal in structure. No evidence of mitral valve regurgitation. No evidence of mitral stenosis.  6. The tricuspid valve is normal in structure. Tricuspid valve regurgitation is trivial.  7. The aortic valve is normal in structure. Aortic valve regurgitation is not visualized. No evidence of aortic valve sclerosis or stenosis.  8. The pulmonic valve was normal in structure. Pulmonic valve regurgitation is not visualized.  9. Mildly elevated pulmonary artery systolic pressure. 10. The inferior vena cava is normal in size with greater than 50% respiratory variability, suggesting right atrial pressure of 3 mmHg. 11. Normal LV systolic function; moderate LVH; grade 1 diastolic dysfunction. FINDINGS  Left Ventricle: Left ventricular ejection fraction, by visual estimation, is 60 to 65%. The left ventricle has  normal function. There is moderately increased left ventricular hypertrophy. Left ventricular diastolic parameters are consistent with Grade I  diastolic dysfunction (impaired relaxation). Normal left atrial pressure. Right Ventricle: The right ventricular size is normal. Global RV systolic function is has normal systolic function. The tricuspid regurgitant velocity is 2.95 m/s, and with an assumed right atrial pressure of 3 mmHg, the estimated right ventricular systolic pressure is mildly elevated at 37.8 mmHg. Left Atrium: Left atrial size was normal in size. Right Atrium: Right atrial size was normal in size Pericardium: There is no evidence of pericardial effusion. Mitral Valve: The mitral valve is normal in structure. No evidence of mitral valve stenosis by observation. No evidence of mitral valve regurgitation. Tricuspid Valve: The tricuspid valve is normal in structure. Tricuspid valve regurgitation is trivial. Aortic Valve: The aortic valve is normal in structure. Aortic valve regurgitation is not visualized. The aortic valve is structurally normal, with no evidence of sclerosis or stenosis. Pulmonic Valve: The pulmonic valve was normal in structure. Pulmonic valve regurgitation is not visualized. Aorta:  The aortic root is normal in size and structure. Venous: The inferior vena cava is normal in size with greater than 50% respiratory variability, suggesting right atrial pressure of 3 mmHg.   LEFT VENTRICLE PLAX 2D LVIDd:         4.13 cm  Diastology LVIDs:         2.88 cm  LV e' lateral:   6.85 cm/s LV PW:         1.38 cm  LV E/e' lateral: 10.3 LV IVS:        1.47 cm  LV e' medial:    6.31 cm/s LVOT diam:     1.80 cm  LV E/e' medial:  11.2 LV SV:         44 ml LV SV Index:   19.60 LVOT Area:     2.54 cm  RIGHT VENTRICLE RV Basal diam:  3.08 cm RV S prime:     5.44 cm/s TAPSE (M-mode): 3.6 cm LEFT ATRIUM             Index       RIGHT ATRIUM           Index LA diam:        4.00 cm 1.88 cm/m  RA Area:      13.20 cm LA Vol (A2C):   33.1 ml 15.57 ml/m RA Volume:   30.80 ml  14.49 ml/m LA Vol (A4C):   33.4 ml 15.71 ml/m LA Biplane Vol: 34.6 ml 16.27 ml/m  AORTIC VALVE LVOT Vmax:   86.50 cm/s LVOT Vmean:  50.600 cm/s LVOT VTI:    0.172 m  AORTA Ao Root diam: 2.90 cm MITRAL VALVE                        TRICUSPID VALVE MV Area (PHT): 3.08 cm             TR Peak grad:   34.8 mmHg MV PHT:        71.34 msec           TR Vmax:        295.00 cm/s MV Decel Time: 246 msec MV E velocity: 70.80 cm/s 103 cm/s  SHUNTS MV A velocity: 80.50 cm/s 70.3 cm/s Systemic VTI:  0.17 m MV E/A ratio:  0.88       1.5       Systemic Diam: 1.80 cm  Kirk Ruths MD Electronically signed by Kirk Ruths MD Signature Date/Time: 10/04/2019/2:06:55 PM    Final    [facility] [place] [facility]  Result Date: 10/10/2019  Lower Venous Study Indications: Covid positive, elevated D-Dimer.  Comparison Study: Prior negative Bilateral LEV done 10/04/19 Performing Technologist: Sharion Dove RVS  Examination Guidelines: A complete evaluation includes B-mode imaging, spectral Doppler, color Doppler, and power Doppler as needed of all accessible portions of each vessel. Bilateral testing is considered an integral part of a complete examination. Limited examinations for reoccurring indications may be performed as noted.  +---------+---------------+---------+-----------+----------+--------------+ RIGHT    CompressibilityPhasicitySpontaneityPropertiesThrombus Aging +---------+---------------+---------+-----------+----------+--------------+ CFV      Full           Yes      Yes                                 +---------+---------------+---------+-----------+----------+--------------+ SFJ      Full                                                        +---------+---------------+---------+-----------+----------+--------------+  FV Prox  Full                                                         +---------+---------------+---------+-----------+----------+--------------+ FV Mid   Full                                                        +---------+---------------+---------+-----------+----------+--------------+ FV DistalFull                                                        +---------+---------------+---------+-----------+----------+--------------+ PFV      Full                                                        +---------+---------------+---------+-----------+----------+--------------+ POP      Full           Yes      Yes                                 +---------+---------------+---------+-----------+----------+--------------+ PTV      Full                                                        +---------+---------------+---------+-----------+----------+--------------+ PERO     Full                                                        +---------+---------------+---------+-----------+----------+--------------+ Gastroc  None                                         Acute          +---------+---------------+---------+-----------+----------+--------------+   +---------+---------------+---------+-----------+----------+--------------+ LEFT     CompressibilityPhasicitySpontaneityPropertiesThrombus Aging +---------+---------------+---------+-----------+----------+--------------+ CFV      Full           Yes      Yes                                 +---------+---------------+---------+-----------+----------+--------------+ SFJ      Full                                                        +---------+---------------+---------+-----------+----------+--------------+  FV Prox  Full                                                        +---------+---------------+---------+-----------+----------+--------------+ FV Mid   Full                                                         +---------+---------------+---------+-----------+----------+--------------+ FV DistalFull                                                        +---------+---------------+---------+-----------+----------+--------------+ PFV      Full                                                        +---------+---------------+---------+-----------+----------+--------------+ POP      Full           Yes      Yes                                 +---------+---------------+---------+-----------+----------+--------------+ PTV      Full                                                        +---------+---------------+---------+-----------+----------+--------------+ PERO     Full                                                        +---------+---------------+---------+-----------+----------+--------------+ Gastroc  None                                         Acute          +---------+---------------+---------+-----------+----------+--------------+     Summary: Right: Findings consistent with acute deep vein thrombosis involving the right gastrocnemius veins. Left: Findings consistent with acute deep vein thrombosis involving the left gastrocnemius veins.  *See table(s) above for measurements and observations. Electronically signed by Curt Jews MD on 10/10/2019 at 1:39:57 PM.    Final    LE Venous  Result Date: 10/04/2019  Lower Venous Study Indications: Covid-19 positive, and Swelling.  Anticoagulation: Lovenox. Comparison Study: No priors Performing Technologist: Velva Harman Sturdivant RDMS, RVT  Examination Guidelines: A complete evaluation includes B-mode imaging, spectral Doppler, color Doppler, and power Doppler as needed of all accessible portions of each vessel. Bilateral testing is considered an integral part of a complete examination.  Limited examinations for reoccurring indications may be performed as noted.   +---------+---------------+---------+-----------+----------+--------------+ RIGHT    CompressibilityPhasicitySpontaneityPropertiesThrombus Aging +---------+---------------+---------+-----------+----------+--------------+ CFV      Full           Yes      Yes                                 +---------+---------------+---------+-----------+----------+--------------+ SFJ      Full                                                        +---------+---------------+---------+-----------+----------+--------------+ FV Prox  Full                                                        +---------+---------------+---------+-----------+----------+--------------+ FV Mid   Full                                                        +---------+---------------+---------+-----------+----------+--------------+ FV DistalFull                                                        +---------+---------------+---------+-----------+----------+--------------+ PFV      Full                                                        +---------+---------------+---------+-----------+----------+--------------+ POP      Full           No       Yes                                 +---------+---------------+---------+-----------+----------+--------------+ PTV      Full                                                        +---------+---------------+---------+-----------+----------+--------------+ PERO     Full                                                        +---------+---------------+---------+-----------+----------+--------------+   +---------+---------------+---------+-----------+----------+--------------+ LEFT     CompressibilityPhasicitySpontaneityPropertiesThrombus Aging +---------+---------------+---------+-----------+----------+--------------+ CFV      Full           Yes      Yes                                  +---------+---------------+---------+-----------+----------+--------------+  SFJ      Full                                                        +---------+---------------+---------+-----------+----------+--------------+ FV Prox  Full                                                        +---------+---------------+---------+-----------+----------+--------------+ FV Mid   Full                                                        +---------+---------------+---------+-----------+----------+--------------+ FV DistalFull                                                        +---------+---------------+---------+-----------+----------+--------------+ PFV      Full                                                        +---------+---------------+---------+-----------+----------+--------------+ POP      Full           Yes      Yes                                 +---------+---------------+---------+-----------+----------+--------------+ PTV      Full                                                        +---------+---------------+---------+-----------+----------+--------------+ PERO     Full                                                        +---------+---------------+---------+-----------+----------+--------------+     Summary: Right: There is no evidence of deep vein thrombosis in the lower extremity. No cystic structure found in the popliteal fossa. Left: There is no evidence of deep vein thrombosis in the lower extremity. No cystic structure found in the popliteal fossa.  *See table(s) above for measurements and observations. Electronically signed by Ruta Hinds MD on 10/04/2019 at 3:31:58 PM.    Final    [place] EKG SITE RITE  Result Date: 10/26/2019 If Site Rite image not attached, placement could not be confirmed due to current cardiac rhythm.   Microbiology Recent Results (from the past 240 hour(s))  Culture, Urine  Status: None   Collection  Time: 10/25/19  1:00 AM   Specimen: Urine, Random  Result Value Ref Range Status   Specimen Description   Final    URINE, RANDOM Performed at [facility] [address], [place], [place] [address]    Special Requests   Final    NONE Performed at [facility], [place] [address], [place], [place] [address]    Culture   Final    NO GROWTH Performed at [facility], [place] [address], [place], [place] [address]    Report Status 10/27/2019 FINAL  Final  Culture, blood (routine x 2)     Status: None   Collection Time: 10/25/19  9:54 PM   Specimen: BLOOD  Result Value Ref Range Status   Specimen Description   Final    BLOOD BLOOD RIGHT WRIST Performed at [facility] [address] [place], [place] [address]    Special Requests   Final    BOTTLES DRAWN AEROBIC ONLY Blood Culture results may not be optimal due to an inadequate volume of blood received in culture bottles Performed at [facility] [address], [place], [place] [address]    Culture   Final    NO GROWTH 5 DAYS Performed at [facility], [place] [address], [place], [place] [address]    Report Status 10/31/2019 FINAL  Final  Culture, blood (routine x 2)     Status: None   Collection Time: 10/25/19  9:59 PM   Specimen: BLOOD  Result Value Ref Range Status   Specimen Description   Final    BLOOD BLOOD RIGHT HAND Performed at [facility] [address] [place], [place] [address]    Special Requests   Final    BOTTLES DRAWN AEROBIC ONLY Blood Culture adequate volume Performed at [facility] [address], [place], [place] [address]    Culture   Final    NO GROWTH 5 DAYS Performed at [facility], [place] [address], [place], [place] [address]    Report Status 10/31/2019 FINAL  Final  Culture, blood (routine x 2)     Status: None (Preliminary result)   Collection Time: 10/28/19 12:18 PM    Specimen: BLOOD  Result Value Ref Range Status   Specimen Description   Final    BLOOD LEFT ARM Performed at [facility] [address], [place], [place] [address]    Special Requests   Final    BOTTLES DRAWN AEROBIC ONLY Blood Culture results may not be optimal due to an inadequate volume of blood received in culture bottles Performed at [facility] [address] [place], [place] [address]    Culture   Final    NO GROWTH 3 DAYS Performed at [facility], [place] [address], [place],  [address]    Report Status PENDING  Incomplete  Culture, blood (routine x 2)     Status: None (Preliminary result)   Collection Time: 10/28/19 12:30 PM   Specimen: BLOOD  Result Value Ref Range Status   Specimen Description   Final    BLOOD LEFT ARM Performed at [facility] [address] [place],  [address]    Special Requests   Final    BOTTLES DRAWN AEROBIC ONLY Blood Culture results may not be optimal due to an inadequate volume of blood received in culture bottles Performed  at [facility], [place] [address] [place], [place] [address]    Culture   Final    NO GROWTH 3 DAYS Performed at [facility], [place] [address], [place], [place] [address]    Report Status PENDING  Incomplete  Culture, respiratory (non-expectorated)     Status: None (Preliminary result)   Collection Time: 10/28/19  5:40 PM   Specimen: Tracheal Aspirate; Respiratory  Result Value Ref Range Status   Specimen Description   Final    TRACHEAL ASPIRATE Performed at [facility] [address], [place], [place] [address]    Special Requests   Final    NONE Performed at [facility], [place] [address], [place], [place] [address]    Gram Stain   Final    ABUNDANT WBC PRESENT, PREDOMINANTLY PMN ABUNDANT GRAM POSITIVE RODS FEW GRAM POSITIVE COCCI    Culture   Final    FEW  ENTEROBACTER AEROGENES SUSCEPTIBILITIES TO FOLLOW CULTURE REINCUBATED FOR BETTER GROWTH Performed at [facility], [place] [address], [place], [place] [address]    Report Status PENDING  Incomplete    Lab Basic Metabolic Panel: Recent Labs  Lab 10/27/19 0015 10/27/19 0355 10/27/19 1725 10/28/19 0537 10/28/19 1304 10/28/19 1756 10/28/19 1838  NA 153* 153* 145 144 146* 145 145  K 6.0* 6.6* 5.2* 5.0 4.9 5.2* 5.5*  CL 115* 113* 108 104 105  --  104  CO2 34* 34* 31 32 34*  --  30  GLUCOSE 235* 180* 444* 365* 285*  --  299*  BUN 133* 123* 131* 141* 162*  --  166*  CREATININE 2.40* 2.72* 2.43* 2.97* 3.03*  --  3.26*  CALCIUM 9.0 9.0 8.4* 8.2* 8.6*  --  8.6*  MG 4.0*  --   --  3.6*  --   --   --    Liver Function Tests: Recent Labs  Lab 10/26/19 0800 10/28/19 1304  AST 24 497*  ALT 21 257*  ALKPHOS 77 90  BILITOT 0.4 0.2*  PROT 6.0* 6.0*  ALBUMIN 1.4* 1.2*   No results for input(s): LIPASE, AMYLASE in the last 168 hours. No results for input(s): AMMONIA in the last 168 hours. CBC: Recent Labs  Lab 10/25/19 0630 10/26/19 0800 10/27/19 0355 10/27/19 0957 10/28/19 0537 10/28/19 1550 10/28/19 1756 November 16, 2019 0508  WBC 8.7 8.9 8.9  --  6.0  --   --  7.4  HGB 9.2* 8.4* 8.2* 7.5* 6.4* 6.6* 7.8* 7.6*  HCT 31.4* 29.1* 30.0* 22.0* 22.6* 23.0* 23.0* 26.0*  MCV 86.3 87.4 91.5  --  88.3  --   --  88.1  PLT 158 148* 157  --  146*  --   --  167   Cardiac Enzymes: No results for input(s): CKTOTAL, CKMB, CKMBINDEX, TROPONINI in the last 168 hours. Sepsis Labs: Recent Labs  Lab 10/26/19 0800 10/27/19 0355 10/28/19 0537 11-16-19 0508  WBC 8.9 8.9 6.0 7.4    Procedures/Operations  Please see above    William [name] Trysten Berti 10/31/2019, 2:16 PM

## 2019-11-18 NOTE — Progress Notes (Signed)
Patient extubated per comfort care order set.  Placed on RA.  No distress noted.

## 2019-11-18 NOTE — Progress Notes (Addendum)
NP notified.  Visitation completed.  Pt's wife is ready for comfort care measures, extubation, etc. She did request a call from the MD  She has also designated [facility] to receive the body.    Pt belongings taken to family visitation.  Wife declined to take anything except cell phone, gold colored ring, robe, Crocs, eye glasses, [employer] key chain, shaving kit with electric razor, pocket change of <$1.00.  She requested the remaining items, clothing, be discarded.    MD & NP notified of death.  Called Pt's spouse, William [name], to inform TOD 1222; verified [facility] to pick up body.  Body prepared and transported to morgue.

## 2019-11-18 DEATH — deceased
